# Patient Record
Sex: Female | Born: 1940 | Race: White | Hispanic: No | Marital: Married | Smoking: Never smoker
Health system: Southern US, Community
[De-identification: ages and names within clinical notes are randomized; demographics above are authoritative.]

## PROBLEM LIST (undated history)

## (undated) DIAGNOSIS — C50211 Malignant neoplasm of upper-inner quadrant of right female breast: Principal | ICD-10-CM

## (undated) DIAGNOSIS — E70319 Ocular albinism, unspecified: Secondary | ICD-10-CM

## (undated) DIAGNOSIS — M797 Fibromyalgia: Secondary | ICD-10-CM

## (undated) DIAGNOSIS — E559 Vitamin D deficiency, unspecified: Secondary | ICD-10-CM

## (undated) DIAGNOSIS — B009 Herpesviral infection, unspecified: Secondary | ICD-10-CM

## (undated) DIAGNOSIS — K2270 Barrett's esophagus without dysplasia: Secondary | ICD-10-CM

## (undated) DIAGNOSIS — F419 Anxiety disorder, unspecified: Secondary | ICD-10-CM

## (undated) DIAGNOSIS — E785 Hyperlipidemia, unspecified: Secondary | ICD-10-CM

## (undated) DIAGNOSIS — M8580 Other specified disorders of bone density and structure, unspecified site: Secondary | ICD-10-CM

## (undated) DIAGNOSIS — K219 Gastro-esophageal reflux disease without esophagitis: Secondary | ICD-10-CM

## (undated) DIAGNOSIS — IMO0002 Reserved for concepts with insufficient information to code with codable children: Secondary | ICD-10-CM

## (undated) DIAGNOSIS — IMO0001 Reserved for inherently not codable concepts without codable children: Secondary | ICD-10-CM

## (undated) DIAGNOSIS — M5136 Other intervertebral disc degeneration, lumbar region: Secondary | ICD-10-CM

## (undated) DIAGNOSIS — F32A Depression, unspecified: Secondary | ICD-10-CM

## (undated) DIAGNOSIS — F329 Major depressive disorder, single episode, unspecified: Secondary | ICD-10-CM

## (undated) DIAGNOSIS — M51369 Other intervertebral disc degeneration, lumbar region without mention of lumbar back pain or lower extremity pain: Secondary | ICD-10-CM

## (undated) DIAGNOSIS — A3790 Whooping cough, unspecified species without pneumonia: Secondary | ICD-10-CM

## (undated) DIAGNOSIS — D649 Anemia, unspecified: Secondary | ICD-10-CM

## (undated) HISTORY — DX: Fibromyalgia: M79.7

## (undated) HISTORY — DX: Depression, unspecified: F32.A

## (undated) HISTORY — DX: Hyperlipidemia, unspecified: E78.5

## (undated) HISTORY — PX: APPENDECTOMY: SHX54

## (undated) HISTORY — DX: Whooping cough, unspecified species without pneumonia: A37.90

## (undated) HISTORY — PX: EYE SURGERY: SHX253

## (undated) HISTORY — DX: Malignant neoplasm of upper-inner quadrant of right female breast: C50.211

## (undated) HISTORY — DX: Ocular albinism, unspecified: E70.319

## (undated) HISTORY — DX: Anxiety disorder, unspecified: F41.9

## (undated) HISTORY — PX: BILATERAL SALPINGOOPHORECTOMY: SHX1223

## (undated) HISTORY — DX: Gastro-esophageal reflux disease without esophagitis: K21.9

## (undated) HISTORY — DX: Anemia, unspecified: D64.9

## (undated) HISTORY — DX: Other specified disorders of bone density and structure, unspecified site: M85.80

## (undated) HISTORY — PX: COLONOSCOPY: SHX174

## (undated) HISTORY — DX: Other intervertebral disc degeneration, lumbar region: M51.36

## (undated) HISTORY — DX: Major depressive disorder, single episode, unspecified: F32.9

## (undated) HISTORY — DX: Other intervertebral disc degeneration, lumbar region without mention of lumbar back pain or lower extremity pain: M51.369

## (undated) HISTORY — DX: Vitamin D deficiency, unspecified: E55.9

## (undated) HISTORY — DX: Herpesviral infection, unspecified: B00.9

## (undated) HISTORY — PX: TOTAL ABDOMINAL HYSTERECTOMY: SHX209

## (undated) HISTORY — DX: Barrett's esophagus without dysplasia: K22.70

---

## 2000-03-17 ENCOUNTER — Ambulatory Visit (HOSPITAL_COMMUNITY): Admission: RE | Admit: 2000-03-17 | Discharge: 2000-03-17 | Payer: Self-pay | Admitting: Specialist

## 2000-03-31 ENCOUNTER — Encounter: Admission: RE | Admit: 2000-03-31 | Discharge: 2000-03-31 | Payer: Self-pay | Admitting: Cardiology

## 2000-03-31 ENCOUNTER — Encounter: Payer: Self-pay | Admitting: Cardiology

## 2001-04-20 ENCOUNTER — Encounter: Payer: Self-pay | Admitting: Cardiology

## 2001-04-20 ENCOUNTER — Encounter: Admission: RE | Admit: 2001-04-20 | Discharge: 2001-04-20 | Payer: Self-pay | Admitting: Cardiology

## 2002-05-07 ENCOUNTER — Encounter: Admission: RE | Admit: 2002-05-07 | Discharge: 2002-05-07 | Payer: Self-pay | Admitting: Internal Medicine

## 2002-05-07 ENCOUNTER — Encounter: Payer: Self-pay | Admitting: Internal Medicine

## 2003-07-25 ENCOUNTER — Encounter: Admission: RE | Admit: 2003-07-25 | Discharge: 2003-07-25 | Payer: Self-pay | Admitting: Internal Medicine

## 2003-07-25 ENCOUNTER — Encounter: Payer: Self-pay | Admitting: Internal Medicine

## 2004-08-12 ENCOUNTER — Encounter: Admission: RE | Admit: 2004-08-12 | Discharge: 2004-08-12 | Payer: Self-pay | Admitting: Internal Medicine

## 2005-10-26 ENCOUNTER — Encounter: Admission: RE | Admit: 2005-10-26 | Discharge: 2005-10-26 | Payer: Self-pay | Admitting: Internal Medicine

## 2006-04-26 ENCOUNTER — Encounter: Admission: RE | Admit: 2006-04-26 | Discharge: 2006-04-26 | Payer: Self-pay | Admitting: Internal Medicine

## 2006-06-08 ENCOUNTER — Ambulatory Visit: Payer: Self-pay | Admitting: Internal Medicine

## 2006-06-23 ENCOUNTER — Ambulatory Visit: Payer: Self-pay | Admitting: Internal Medicine

## 2006-06-23 ENCOUNTER — Encounter: Payer: Self-pay | Admitting: Internal Medicine

## 2006-11-09 ENCOUNTER — Encounter: Admission: RE | Admit: 2006-11-09 | Discharge: 2006-11-09 | Payer: Self-pay | Admitting: Internal Medicine

## 2008-02-01 ENCOUNTER — Encounter: Admission: RE | Admit: 2008-02-01 | Discharge: 2008-02-01 | Payer: Self-pay | Admitting: Internal Medicine

## 2009-02-04 ENCOUNTER — Encounter: Admission: RE | Admit: 2009-02-04 | Discharge: 2009-02-04 | Payer: Self-pay | Admitting: Internal Medicine

## 2010-02-04 ENCOUNTER — Encounter: Admission: RE | Admit: 2010-02-04 | Discharge: 2010-02-04 | Payer: Self-pay | Admitting: Internal Medicine

## 2010-11-07 ENCOUNTER — Encounter: Payer: Self-pay | Admitting: Internal Medicine

## 2011-01-05 ENCOUNTER — Other Ambulatory Visit: Payer: Self-pay | Admitting: Internal Medicine

## 2011-01-05 DIAGNOSIS — Z1231 Encounter for screening mammogram for malignant neoplasm of breast: Secondary | ICD-10-CM

## 2011-02-07 ENCOUNTER — Ambulatory Visit
Admission: RE | Admit: 2011-02-07 | Discharge: 2011-02-07 | Disposition: A | Discharge status: Home / Self Care | Payer: BC Managed Care – PPO | Source: Ambulatory Visit | Attending: Internal Medicine | Admitting: Internal Medicine

## 2011-02-07 DIAGNOSIS — Z1231 Encounter for screening mammogram for malignant neoplasm of breast: Secondary | ICD-10-CM

## 2011-03-04 NOTE — Assessment & Plan Note (Signed)
[facility]                           GASTROENTEROLOGY OFFICE NOTE   NAME:Podoll, ZAHIRAH CHESLOCK                   MRN:          [id number]  DATE:06/08/2006                            DOB:          1941-05-23    REFERRING PHYSICIAN:  Loraine Leriche A. Perini, MD   REASON FOR CONSULTATION:  Anemia.   HISTORY:  This is a pleasant, 70 year old, white female with no significant  past medical history, who is referred through the courtesy of Dr. Waynard Edwards  regarding anemia.  Over the past half-a-year, the patient has noticed  that  she has been a bit more tired than usual.  She held off evaluation until her  recent annual checkup.  At that time, blood work revealed anemia with a  hemoglobin of about 10.  This was down from a hemoglobin of 12.6 one year  prior.  Her MCV was normal at 92.4.  She tells me that she had iron studies  done as well as B12 and folate.  Those results are pending.   The patient's GI review of systems is remarkable for indigestion and  heartburn, particularly with dietary indiscretion, for which she takes  Protonix daily.  No dysphagia.  She denies abdominal pain, constipation,  diarrhea, melena, or hematochezia.  She has noticed some bloating recently  and a 5-pound weight gain.  She was to have submitted stool studies for  occult blood, but she has failed to do so.  She also mentions to me that she  has had microscopic hematuria for which she is currently being evaluated by  Dr. Retta Diones.  She has used nonsteroidal anti-inflammatory drugs in the  past for arthritic pain, but she reports not using such agents within the  past year.  She also reports having had a colonoscopy about 10 years ago  which, by her account, was unremarkable.  She does mention to me a brother  being diagnosed with gastric cancer (type unknown) about 1 year ago.  Unfortunately, he is dying.   PAST MEDICAL HISTORY:  Osteoarthritis.   PAST SURGICAL HISTORY:  1.  Hysterectomy.  2. Appendectomy.   ALLERGIES:  The patient has no known drug allergies.   CURRENT MEDICATIONS:  The patient's current medications include Protonix 40  mg daily, Glucosamine daily, Cymbalta 30 mg daily, and Ambien p.r.n.   FAMILY HISTORY:  The patient has a brother with unspecified gastric cancer.  No colon cancer or occult polyps.   SOCIAL HISTORY:  The patient is married with 1 son.  She lives with her  husband.  She works as a Interior and spatial designer for [employer].  She does not  smoke and rarely uses alcohol.   REVIEW OF SYSTEMS:  Per diagnostic evaluation form.   PHYSICAL EXAMINATION:  GENERAL:  This is a well-appearing female in no acute distress.   VITAL SIGNS:  BP 110/70, heart rate 68, weight 164.6 pounds, height 5 feet 3  inches.   HEENT:  Sclerae are anicteric.  Conjunctive are pink.  Oral mucosa is  intact.  No adenopathy.   LUNGS:  Clear.   HEART:  Regular.   ABDOMEN:  Soft without tenderness, mass, or hernia.   RECTAL:  Deferred.   EXTREMITIES:  No edema.   IMPRESSION:  1. Normocytic anemia of uncertain cause.  No evidence of clinically      obvious gastrointestinal blood loss.  B12, folate, and iron studies are      pending.  2. Gastroesophageal reflux disease.  No alarm symptoms.  Protonix required      to control symptoms.  3. Family history of gastric cancer in the patient's brother.  Type      unspecified.  4. Microscopic hematuria.  Currently undergoing evaluation.   RECOMMENDATIONS:  1. Obtain a colonoscopy and upper endoscopy to evaluate anemia.  Also      provide colorectal neoplasia screening.  In addition, screen for      Barrette esophagus and exclude risk factors for gastric cancer      (Helicobacter pylori).  2. Continue Protonix.  3. Follow up anemia studies.   Thank you for this consultation.                                   Wilhemina Bonito. Eda Keys., MD   JNP/MedQ  DD:  06/08/2006  DT:  06/08/2006  Job #:  [id number]    cc:   Loraine Leriche A. Waynard Edwards, MD  Bertram Millard. Retta Diones, MD

## 2012-02-06 ENCOUNTER — Other Ambulatory Visit: Payer: Self-pay | Admitting: Internal Medicine

## 2012-02-06 DIAGNOSIS — Z1231 Encounter for screening mammogram for malignant neoplasm of breast: Secondary | ICD-10-CM

## 2012-04-02 ENCOUNTER — Ambulatory Visit
Admission: RE | Admit: 2012-04-02 | Discharge: 2012-04-02 | Disposition: A | Discharge status: Home / Self Care | Payer: Medicare Other | Source: Ambulatory Visit | Attending: Internal Medicine | Admitting: Internal Medicine

## 2012-04-02 DIAGNOSIS — Z1231 Encounter for screening mammogram for malignant neoplasm of breast: Secondary | ICD-10-CM

## 2012-04-03 ENCOUNTER — Ambulatory Visit: Payer: BC Managed Care – PPO

## 2013-03-21 ENCOUNTER — Encounter: Payer: Self-pay | Admitting: Internal Medicine

## 2013-04-15 ENCOUNTER — Telehealth: Payer: Self-pay | Admitting: Internal Medicine

## 2013-04-15 ENCOUNTER — Ambulatory Visit: Payer: Medicare Other | Admitting: Internal Medicine

## 2013-04-22 ENCOUNTER — Other Ambulatory Visit: Payer: Self-pay

## 2013-04-22 ENCOUNTER — Encounter: Payer: Self-pay | Admitting: Internal Medicine

## 2013-04-22 DIAGNOSIS — Z1231 Encounter for screening mammogram for malignant neoplasm of breast: Secondary | ICD-10-CM

## 2013-06-03 ENCOUNTER — Ambulatory Visit
Admission: RE | Admit: 2013-06-03 | Discharge: 2013-06-03 | Disposition: A | Discharge status: Home / Self Care | Payer: Medicare Other | Source: Ambulatory Visit

## 2013-06-03 ENCOUNTER — Ambulatory Visit (INDEPENDENT_AMBULATORY_CARE_PROVIDER_SITE_OTHER): Payer: Medicare Other | Admitting: Internal Medicine

## 2013-06-03 ENCOUNTER — Encounter: Payer: Self-pay | Admitting: Internal Medicine

## 2013-06-03 VITALS — BP 130/72 | HR 70 | Ht 61.5 in | Wt 182.2 lb

## 2013-06-03 DIAGNOSIS — K219 Gastro-esophageal reflux disease without esophagitis: Secondary | ICD-10-CM

## 2013-06-03 DIAGNOSIS — Z1231 Encounter for screening mammogram for malignant neoplasm of breast: Secondary | ICD-10-CM

## 2013-06-03 DIAGNOSIS — K625 Hemorrhage of anus and rectum: Secondary | ICD-10-CM

## 2013-06-03 DIAGNOSIS — Z800 Family history of malignant neoplasm of digestive organs: Secondary | ICD-10-CM

## 2013-06-03 MED ORDER — MOVIPREP 100 G PO SOLR: 1.0000 | Freq: Once | ORAL | Status: DC

## 2013-06-03 NOTE — Patient Instructions (Addendum)

## 2013-06-03 NOTE — Progress Notes (Signed)
HISTORY OF PRESENT ILLNESS:  Kelli Owens is a 72 y.o. female with the below listed medical history who presents today regarding rectal bleeding, family history of colon cancer, and the need for colonoscopy. The patient was evaluated in the office 06/08/2006 regarding anemia. See that dictation for details. In September 2007 she underwent colonoscopy and upper endoscopy. Complete colonoscopy, with intubation of the terminal ileum, was normal. Routine followup in 10 years recommended. Upper endoscopy was normal except for questionable short segment Barrett's esophagus which was not confirmed with biopsy. Patient has not been seen since. She did have isolated episode of painless rectal bleeding in March. No recurrence. She denies abdominal pain, change in bowel habits, or weight loss. Of importance, her sister was diagnosed with colon cancer recently (age 59). Other GI complaints are that of acid reflux. Symptoms mostly controlled with daily Protonix. Occasional breakthrough with dietary indiscretion. No dysphagia. GI review of systems otherwise negative. Review of outside laboratories from 04/10/2013 reveal normal CBC with hemoglobin 12.6. Normal comprehensive metabolic panel   REVIEW OF SYSTEMS:  All non-GI ROS negative except for back pain, arthritis, fatigue  Past Medical History  Diagnosis Date  . Depression   . Anxiety   . GERD (gastroesophageal reflux disease)   . Anemia   . Dyslipidemia   . DDD (degenerative disc disease), lumbar   . Osteopenia   . Fibromyalgia   . OA (ocular albinism)   . HSV infection   . Vitamin D deficiency   . Whooping cough   . Barrett's esophagus     Past Surgical History  Procedure Laterality Date  . Appendectomy    . Total abdominal hysterectomy    . Bilateral salpingoophorectomy      Social History Kelli Owens  reports that she has never smoked. She has never used smokeless tobacco. She reports that  drinks alcohol. She reports that she  does not use illicit drugs.  family history includes Cancer in an other family member; Colon cancer (age of onset: 21) in her sister; Dementia in her brother; Diabetes in her father; Gastric cancer in her brother; Kidney disease in her mother; Stroke in her mother.  No Known Allergies     PHYSICAL EXAMINATION: Vital signs: BP 130/72  Pulse 70  Ht 5' 1.5" (1.562 m)  Wt 182 lb 3.2 oz (82.645 kg)  BMI 33.87 kg/m2  Constitutional: generally well-appearing, no acute distress Psychiatric: alert and oriented x3, cooperative Eyes: extraocular movements intact, anicteric, conjunctiva pink Mouth: oral pharynx moist, no lesions Neck: supple no lymphadenopathy Cardiovascular: heart regular rate and rhythm, no murmur Lungs: clear to auscultation bilaterally Abdomen: soft, nontender, nondistended, no obvious ascites, no peritoneal signs, normal bowel sounds, no organomegaly Rectal: Deferred until colonoscopy Extremities: no lower extremity edema bilaterally Skin: no lesions on visible extremities Neuro: No focal deficits. No asterixis.    ASSESSMENT:  #1. Minor rectal bleeding #2. Interval family history of colon cancer #3. Chronic GERD on PPI. Prior EGD as described #4. Negative index colonoscopy 2007   PLAN:  #1. Colonoscopy to provide colorectal neoplasia screening and to evaluate bleeding.The nature of the procedure, as well as the risks, benefits, and alternatives were carefully and thoroughly reviewed with the patient. Ample time for discussion and questions allowed. The patient understood, was satisfied, and agreed to proceed.  #2. Movi prep prescribed. Patient instructed on its use #3. Reflux precautions #4. Continue PPI to control GERD symptoms

## 2013-06-07 ENCOUNTER — Other Ambulatory Visit: Payer: Self-pay | Admitting: Internal Medicine

## 2013-06-07 DIAGNOSIS — R928 Other abnormal and inconclusive findings on diagnostic imaging of breast: Secondary | ICD-10-CM

## 2013-06-14 ENCOUNTER — Ambulatory Visit (AMBULATORY_SURGERY_CENTER): Payer: Medicare Other | Admitting: Internal Medicine

## 2013-06-14 ENCOUNTER — Encounter: Payer: Self-pay | Admitting: Internal Medicine

## 2013-06-14 VITALS — BP 116/64 | HR 59 | Temp 97.6°F | Resp 17 | Ht 61.5 in | Wt 182.0 lb

## 2013-06-14 DIAGNOSIS — K625 Hemorrhage of anus and rectum: Secondary | ICD-10-CM

## 2013-06-14 DIAGNOSIS — Z800 Family history of malignant neoplasm of digestive organs: Secondary | ICD-10-CM

## 2013-06-14 DIAGNOSIS — D126 Benign neoplasm of colon, unspecified: Secondary | ICD-10-CM

## 2013-06-14 DIAGNOSIS — Z1211 Encounter for screening for malignant neoplasm of colon: Secondary | ICD-10-CM

## 2013-06-14 MED ORDER — SODIUM CHLORIDE 0.9 % IV SOLN: 500.0000 mL | INTRAVENOUS | Status: DC

## 2013-06-14 NOTE — Progress Notes (Signed)
Called to room to assist during endoscopic procedure.  Patient ID and intended procedure confirmed with present staff. Received instructions for my participation in the procedure from the performing physician.  

## 2013-06-14 NOTE — Progress Notes (Signed)
Procedure ends, to recovery, report given and VSS. 

## 2013-06-14 NOTE — Op Note (Signed)
[facility] [address] [place], [address]   COLONOSCOPY PROCEDURE REPORT  PATIENT: Kelli Owens, Kelli Owens  MR#: [id number] BIRTHDATE: 1940-12-05 , 72  yrs. old GENDER: Female ENDOSCOPIST: Roxy Cedar, MD REFERRED NW:GNFA Perini, M.D. PROCEDURE DATE:  06/14/2013 PROCEDURE:   Colonoscopy with snare polypectomy x 1 First Screening Colonoscopy - Avg.  risk and is 50 yrs.  old or older - No.  Prior Negative Screening - Now for repeat screening. N/A  History of Adenoma - Now for follow-up colonoscopy & has been > or = to 3 yrs.  N/A  Polyps Removed Today? Yes. ASA CLASS:   Class II INDICATIONS:Patient's immediate family history of colon cancer (sister 41) and Rectal Bleeding.   Negative exam 2007 MEDICATIONS: MAC sedation, administered by CRNA and propofol (Diprivan) 250mg  IV  DESCRIPTION OF PROCEDURE:   After the risks benefits and alternatives of the procedure were thoroughly explained, informed consent was obtained.  A digital rectal exam revealed no abnormalities of the rectum.   The LB [id number]  endoscope was introduced through the anus and advanced to the cecum, which was identified by both the appendix and ileocecal valve. No adverse events experienced.   The quality of the prep was excellent, using MoviPrep  The instrument was then slowly withdrawn as the colon was fully examined.    COLON FINDINGS: A single polyp measuring 5 mm in size was found in the transverse colon.  A polypectomy was performed with a cold snare.  The resection was complete and the polyp tissue was completely retrieved.   Mild diverticulosis was noted in the sigmoid colon.   The colon mucosa was otherwise normal. Retroflexed views revealed no abnormalities. The time to cecum=2 minutes 47 seconds.  Withdrawal time=9 minutes 45 seconds.  The scope was withdrawn and the procedure completed. COMPLICATIONS: There were no complications.  ENDOSCOPIC IMPRESSION: 1.   Single  polyp measuring 5 mm in size was found in the transverse colon; polypectomy was performed with a cold snare 2.   Mild diverticulosis was noted in the sigmoid colon 3.   The colon mucosa was otherwise normal  RECOMMENDATIONS: 1. Return to the care of your primary provider.  GI follow up as needed   eSigned:  Roxy Cedar, MD 06/14/2013 10:19 AM   cc: Rodrigo Ran, MD and The Patient   PATIENT NAME:  Kelli Owens, Kelli Owens MR#: [id number]

## 2013-06-14 NOTE — Patient Instructions (Addendum)

## 2013-06-14 NOTE — Progress Notes (Signed)
Patient did not experience any of the following events: a burn prior to discharge; a fall within the facility; wrong site/side/patient/procedure/implant event; or a hospital transfer or hospital admission upon discharge from the facility. (G8907) Patient did not have preoperative order for IV antibiotic SSI prophylaxis. (G8918)  

## 2013-06-18 ENCOUNTER — Telehealth: Payer: Self-pay | Admitting: *Deleted

## 2013-06-18 NOTE — Telephone Encounter (Signed)
  Follow up Call-  Call back number 06/14/2013  Post procedure Call Back phone  # [phone]  Permission to leave phone message Yes     Patient questions:  Do you have a fever, pain , or abdominal swelling? no Pain Score  0 *  Have you tolerated food without any problems? yes  Have you been able to return to your normal activities? yes  Do you have any questions about your discharge instructions: Diet   no Medications  no Follow up visit  no  Do you have questions or concerns about your Care? no  Actions: * If pain score is 4 or above: No action needed, pain <4.

## 2013-06-21 ENCOUNTER — Encounter: Payer: Self-pay | Admitting: Internal Medicine

## 2013-07-04 ENCOUNTER — Ambulatory Visit
Admission: RE | Admit: 2013-07-04 | Discharge: 2013-07-04 | Disposition: A | Discharge status: Home / Self Care | Payer: Medicare Other | Source: Ambulatory Visit | Attending: Internal Medicine | Admitting: Internal Medicine

## 2013-07-04 DIAGNOSIS — R928 Other abnormal and inconclusive findings on diagnostic imaging of breast: Secondary | ICD-10-CM

## 2014-07-09 ENCOUNTER — Other Ambulatory Visit: Payer: Self-pay

## 2014-07-09 DIAGNOSIS — Z1231 Encounter for screening mammogram for malignant neoplasm of breast: Secondary | ICD-10-CM

## 2014-07-16 ENCOUNTER — Ambulatory Visit
Admission: RE | Admit: 2014-07-16 | Discharge: 2014-07-16 | Disposition: A | Discharge status: Home / Self Care | Payer: Medicare Other | Source: Ambulatory Visit

## 2014-07-16 DIAGNOSIS — Z1231 Encounter for screening mammogram for malignant neoplasm of breast: Secondary | ICD-10-CM

## 2014-07-17 ENCOUNTER — Other Ambulatory Visit: Payer: Self-pay | Admitting: Internal Medicine

## 2014-07-17 DIAGNOSIS — R928 Other abnormal and inconclusive findings on diagnostic imaging of breast: Secondary | ICD-10-CM

## 2014-07-28 ENCOUNTER — Other Ambulatory Visit: Payer: Self-pay | Admitting: Internal Medicine

## 2014-07-28 DIAGNOSIS — R928 Other abnormal and inconclusive findings on diagnostic imaging of breast: Secondary | ICD-10-CM

## 2014-08-06 ENCOUNTER — Other Ambulatory Visit: Payer: Self-pay | Admitting: Internal Medicine

## 2014-08-06 ENCOUNTER — Ambulatory Visit
Admission: RE | Admit: 2014-08-06 | Discharge: 2014-08-06 | Disposition: A | Discharge status: Home / Self Care | Payer: Medicare Other | Source: Ambulatory Visit | Attending: Internal Medicine | Admitting: Internal Medicine

## 2014-08-06 DIAGNOSIS — R928 Other abnormal and inconclusive findings on diagnostic imaging of breast: Secondary | ICD-10-CM

## 2014-08-08 ENCOUNTER — Encounter: Payer: Self-pay | Admitting: *Deleted

## 2014-08-08 ENCOUNTER — Telehealth: Payer: Self-pay | Admitting: *Deleted

## 2014-08-08 DIAGNOSIS — C50211 Malignant neoplasm of upper-inner quadrant of right female breast: Secondary | ICD-10-CM | POA: Insufficient documentation

## 2014-08-08 HISTORY — DX: Malignant neoplasm of upper-inner quadrant of right female breast: C50.211

## 2014-08-08 NOTE — Telephone Encounter (Signed)
Confirmed [facility]  appt and gave instructions. Pt denies further needs at this time. Contact information given.

## 2014-08-08 NOTE — Telephone Encounter (Signed)
Left vm for pt to return call concerning appt for [facility] on 08/13/14. Leigh from [facility] informed pt request AM clinic and gave arrival time of 0800.

## 2014-08-13 ENCOUNTER — Encounter: Payer: Self-pay | Admitting: Hematology and Oncology

## 2014-08-13 ENCOUNTER — Ambulatory Visit (INDEPENDENT_AMBULATORY_CARE_PROVIDER_SITE_OTHER): Payer: Self-pay | Admitting: Surgery

## 2014-08-13 ENCOUNTER — Ambulatory Visit: Payer: Medicare Other

## 2014-08-13 ENCOUNTER — Encounter: Payer: Self-pay | Admitting: *Deleted

## 2014-08-13 ENCOUNTER — Other Ambulatory Visit: Payer: Self-pay | Admitting: Hematology and Oncology

## 2014-08-13 ENCOUNTER — Ambulatory Visit
Admission: RE | Admit: 2014-08-13 | Discharge: 2014-08-13 | Disposition: A | Discharge status: Home / Self Care | Payer: Medicare Other | Source: Ambulatory Visit | Attending: Radiation Oncology | Admitting: Radiation Oncology

## 2014-08-13 ENCOUNTER — Telehealth: Payer: Self-pay | Admitting: Hematology and Oncology

## 2014-08-13 ENCOUNTER — Ambulatory Visit: Payer: Medicare Other | Attending: Surgery | Admitting: Physical Therapy

## 2014-08-13 ENCOUNTER — Encounter (INDEPENDENT_AMBULATORY_CARE_PROVIDER_SITE_OTHER): Payer: Self-pay

## 2014-08-13 ENCOUNTER — Other Ambulatory Visit (HOSPITAL_BASED_OUTPATIENT_CLINIC_OR_DEPARTMENT_OTHER): Payer: Medicare Other

## 2014-08-13 ENCOUNTER — Other Ambulatory Visit: Payer: Self-pay | Admitting: *Deleted

## 2014-08-13 ENCOUNTER — Encounter: Payer: Self-pay | Admitting: Radiation Oncology

## 2014-08-13 ENCOUNTER — Encounter: Payer: Self-pay | Admitting: Dietician

## 2014-08-13 ENCOUNTER — Ambulatory Visit (HOSPITAL_BASED_OUTPATIENT_CLINIC_OR_DEPARTMENT_OTHER): Payer: Medicare Other | Admitting: Hematology and Oncology

## 2014-08-13 VITALS — BP 135/61 | HR 59 | Temp 97.6°F | Resp 18 | Ht 61.5 in | Wt 171.3 lb

## 2014-08-13 DIAGNOSIS — C50211 Malignant neoplasm of upper-inner quadrant of right female breast: Secondary | ICD-10-CM

## 2014-08-13 DIAGNOSIS — Z171 Estrogen receptor negative status [ER-]: Secondary | ICD-10-CM

## 2014-08-13 DIAGNOSIS — R293 Abnormal posture: Secondary | ICD-10-CM | POA: Insufficient documentation

## 2014-08-13 DIAGNOSIS — C50919 Malignant neoplasm of unspecified site of unspecified female breast: Secondary | ICD-10-CM

## 2014-08-13 DIAGNOSIS — C50911 Malignant neoplasm of unspecified site of right female breast: Secondary | ICD-10-CM

## 2014-08-13 LAB — CBC WITH DIFFERENTIAL/PLATELET
BASO%: 0.3 % (ref 0.0–2.0)
Basophils Absolute: 0 10*3/uL (ref 0.0–0.1)
EOS%: 0.6 % (ref 0.0–7.0)
Eosinophils Absolute: 0 10*3/uL (ref 0.0–0.5)
HEMATOCRIT: 36.2 % (ref 34.8–46.6)
HGB: 12 g/dL (ref 11.6–15.9)
LYMPH#: 2 10*3/uL (ref 0.9–3.3)
LYMPH%: 37.9 % (ref 14.0–49.7)
MCH: 31.3 pg (ref 25.1–34.0)
MCHC: 33.1 g/dL (ref 31.5–36.0)
MCV: 94.4 fL (ref 79.5–101.0)
MONO#: 0.4 10*3/uL (ref 0.1–0.9)
MONO%: 8.5 % (ref 0.0–14.0)
NEUT#: 2.8 10*3/uL (ref 1.5–6.5)
NEUT%: 52.7 % (ref 38.4–76.8)
PLATELETS: 250 10*3/uL (ref 145–400)
RBC: 3.83 10*6/uL (ref 3.70–5.45)
RDW: 12.5 % (ref 11.2–14.5)
WBC: 5.3 10*3/uL (ref 3.9–10.3)

## 2014-08-13 LAB — COMPREHENSIVE METABOLIC PANEL (CC13)
ALT: 20 U/L (ref 0–55)
AST: 15 U/L (ref 5–34)
Albumin: 3.6 g/dL (ref 3.5–5.0)
Alkaline Phosphatase: 40 U/L (ref 40–150)
Anion Gap: 7 mEq/L (ref 3–11)
BILIRUBIN TOTAL: 0.37 mg/dL (ref 0.20–1.20)
BUN: 16.5 mg/dL (ref 7.0–26.0)
CO2: 23 mEq/L (ref 22–29)
CREATININE: 0.9 mg/dL (ref 0.6–1.1)
Calcium: 9.7 mg/dL (ref 8.4–10.4)
Chloride: 112 mEq/L — ABNORMAL HIGH (ref 98–109)
Glucose: 90 mg/dl (ref 70–140)
Potassium: 3.9 mEq/L (ref 3.5–5.1)
SODIUM: 143 meq/L (ref 136–145)
TOTAL PROTEIN: 6.3 g/dL — AB (ref 6.4–8.3)

## 2014-08-13 NOTE — Progress Notes (Signed)
Patient was seen by RD during [facility] on 08/13/2014  Provided pt with folder of educational materials regarding general nutrition recommendations for breast cancer patients, plant-based diets, antioxidants, cancer facts vs myths, and information on organic foods  Explained importance of healthy nutrition during treatments and encouraged pt to consume daily recommended amount of fruits and vegetables, emphasizing variety of intake for maximum antioxidant and synergistic health benefits. Promoted adequate fiber intake, with use of whole grain and whole wheat products, beans, and lentils. Encouraged patient to follow a low fat diet with use of heart healthy fats, and to opt for plant-based proteins weekly  Recommended pt maintain healthy weight during treatments, and encouraged gradual weight loss as warranted after procedures.  Diet recall indicated regular meals with increased vegetables.  Patient had questions regarding healthy eating, supplements.  Expect good compliance  Provided pt with outpatient oncology RD contact information. Encouraged pt to contact RD with additional follow up questions or nutrition-related concerns.  Antonieta Iba, RD, LDN Clinical Inpatient Dietitian Pager:  [phone] Weekend and after hours pager:  [phone]

## 2014-08-13 NOTE — Progress Notes (Signed)
Note created by Dr. Lindi Adie during office visit. Copy to patient original to scan.

## 2014-08-13 NOTE — Assessment & Plan Note (Addendum)
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 After reviewing the pathology in detail, we proceeded to discuss the different treatment options between surgery, radiation, chemotherapy, and the lack of benefit from antiestrogen therapies because she is ER/PR negative.  Recommendation: Based on multidisciplinary tumor board discussion, our recommendation would be to do neoadjuvant chemotherapy. But the patient is reluctant to undergo chemotherapy up front and might choose to undergo surgery up front. If she chooses neoadjuvant chemotherapy, We will obtain MRI of the breast for pre-chemotherapy evaluation. Neoadjuvant chemotherapy regimen: Taxotere, carboplatin, Herceptin, Perjeta every 3 weeks x6 cycles of Herceptin maintenance for one year. After surgery, she could need radiation and Herceptin maintenance therapy. There is no role of antiestrogen therapy. If she decides to undergo surgery up front, we will reassess her after surgery to determine the best treatment option.  Chemotherapy counseling:I discussed the risks and benefits of chemotherapy including the risks of nausea/ vomiting, risk of infection from low WBC count, fatigue due to chemo or anemia, bruising or bleeding due to low platelets, mouth sores, loss/ change in taste and decreased appetite. Liver and kidney function will be monitored through out chemotherapy as abnormalities in liver and kidney function may be a side effect of treatment. Cardiac dysfunction due to Herceptin  and Perjeta were discussed in detail.  Plan: Await final decision regarding surgery versus chemotherapy  upfront. If she decides to do neoadjuvant chemotherapy, we will obtain echocardiogram, chemotherapy, port placement and MRI of the breasts prior to starting treatment .

## 2014-08-13 NOTE — Telephone Encounter (Signed)
, °

## 2014-08-13 NOTE — Progress Notes (Signed)
[facility] CONSULT NOTE  Patient Care Team: Jerlyn Ly, MD as PCP - General (Internal Medicine) Alphonsa Overall, MD as Consulting Physician (General Surgery) Rulon Eisenmenger, MD as Consulting Physician (Hematology and Oncology) Blair Promise, MD as Consulting Physician (Radiation Oncology)  CHIEF COMPLAINTS/PURPOSE OF CONSULTATION:  Newly diagnosed breast cancer  HISTORY OF PRESENTING ILLNESS:  Kelli Owens 73 y.o. female is here because of recent diagnosis of right breast cancer. She had a mammogram 07/17/2014 that revealed an abnormality in right breast at 3:00 position. This was followed by ultrasound which revealed 2.4 cm abnormality in the breast. 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 She was presented this morning at the multidisciplinary tumor board. She is here with a breast [facility] to discuss treatment plan. She has never felt any abnormalities in the breast to physical exam. She reports that her primary care physician also examined her and did not feel a lump.  I reviewed her records extensively and collaborated the history with the patient.  SUMMARY OF ONCOLOGIC HISTORY:   Breast cancer of upper-inner quadrant of right female breast   07/17/2014 Mammogram Right breast mass at 3:00 position by ultrasound measured 2.4 cm   08/06/2014 Initial Biopsy 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 ratio 2.75, Ki-67 80%    In terms of breast cancer risk profile:  She menarched at early age of 78 and went to menopause at age 75  She had one pregnancy, her first child was born at age 36  She has not received birth control pills.  She was never exposed to fertility medications or hormone replacement therapy.  She has  family history of Breast/GYN/GI cancer  MEDICAL HISTORY:  Past Medical History  Diagnosis Date  . Depression   . Anxiety   . GERD  (gastroesophageal reflux disease)   . Anemia   . Dyslipidemia   . DDD (degenerative disc disease), lumbar   . Osteopenia   . Fibromyalgia   . OA (ocular albinism)   . HSV infection   . Vitamin D deficiency   . Whooping cough   . Barrett's esophagus   . Breast cancer of upper-inner quadrant of right female breast 08/08/2014    SURGICAL HISTORY: Past Surgical History  Procedure Laterality Date  . Appendectomy    . Total abdominal hysterectomy    . Bilateral salpingoophorectomy      SOCIAL HISTORY: History   Social History  . Marital Status: Married    Spouse Name: N/A    Number of Children: 1  . Years of Education: N/A   Occupational History  . retired    Social History Main Topics  . Smoking status: Never Smoker   . Smokeless tobacco: Never Used  . Alcohol Use: 1.2 oz/week    2 Glasses of wine per week     Comment: rarely  . Drug Use: No  . Sexual Activity: Not on file   Other Topics Concern  . Not on file   Social History Narrative  . No narrative on file    FAMILY HISTORY: Family History  Problem Relation Age of Onset  . Diabetes Father   . Stroke Mother   . Gastric cancer Brother   . Dementia Brother   . Cancer      groin area  . Colon cancer Sister 68  . Kidney disease Mother     lost rt.  kidney due to stone    ALLERGIES:  has No Known Allergies.  MEDICATIONS:  Current Outpatient Prescriptions  Medication Sig Dispense Refill  . aspirin 81 MG tablet Take 81 mg by mouth daily.      . Calcium Carbonate-Vitamin D (CALCIUM PLUS VITAMIN D PO) Take 1 tablet by mouth daily.      . fexofenadine (ALLEGRA) 180 MG tablet Take 180 mg by mouth daily.      . furosemide (LASIX) 20 MG tablet Take 20 mg by mouth daily.      Marland Kitchen glucosamine-chondroitin 500-400 MG tablet Take 1 tablet by mouth daily.      Marland Kitchen oxyCODONE-acetaminophen (TYLOX) 5-500 MG per capsule Take 1 capsule by mouth every 4 (four) hours as needed for pain.      . pantoprazole (PROTONIX) 40 MG  tablet Take 40 mg by mouth daily.      . pravastatin (PRAVACHOL) 20 MG tablet       . pregabalin (LYRICA) 25 MG capsule Take 25 mg by mouth as needed.      . temazepam (RESTORIL) 30 MG capsule Take 30 mg by mouth at bedtime as needed for sleep.      . traMADol (ULTRAM) 50 MG tablet Take 50 mg by mouth every 6 (six) hours as needed for pain.      . Vitamin D, Ergocalciferol, (DRISDOL) 50000 UNITS CAPS Take 50,000 Units by mouth every 7 (seven) days.       No current facility-administered medications for this visit.    REVIEW OF SYSTEMS:   Constitutional: Denies fevers, chills or abnormal night sweats Eyes: Denies blurriness of vision, double vision or watery eyes Ears, nose, mouth, throat, and face: Denies mucositis or sore throat Respiratory: Denies cough, dyspnea or wheezes Cardiovascular: Denies palpitation, chest discomfort or lower extremity swelling Gastrointestinal:  Denies nausea, heartburn or change in bowel habits Skin: Denies abnormal skin rashes Lymphatics: Denies new lymphadenopathy or easy bruising Neurological:Denies numbness, tingling or new weaknesses Behavioral/Psych: Mood is stable, no new changes  Breast:  Denies any palpable lumps or discharge All other systems were reviewed with the patient and are negative.  PHYSICAL EXAMINATION: ECOG PERFORMANCE STATUS: 0 - Asymptomatic  Filed Vitals:   08/13/14 0852  BP: 135/61  Pulse: 59  Temp: 97.6 F (36.4 C)  Resp: 18   Filed Weights   08/13/14 0852  Weight: 171 lb 4.8 oz (77.701 kg)    GENERAL:alert, no distress and comfortable SKIN: skin color, texture, turgor are normal, no rashes or significant lesions EYES: normal, conjunctiva are pink and non-injected, sclera clear OROPHARYNX:no exudate, no erythema and lips, buccal mucosa, and tongue normal  NECK: supple, thyroid normal size, non-tender, without nodularity LYMPH:  no palpable lymphadenopathy in the cervical, axillary or inguinal LUNGS: clear to  auscultation and percussion with normal breathing effort HEART: regular rate & rhythm and no murmurs and no lower extremity edema ABDOMEN:abdomen soft, non-tender and normal bowel sounds Musculoskeletal:no cyanosis of digits and no clubbing  PSYCH: alert & oriented x 3 with fluent speech NEURO: no focal motor/sensory deficits BREAST bruising noted in the right breast where she had the biopsy is a small hematoma underneath it. Otherwise no palpable masses or lymphadenopathy  LABORATORY DATA:  I have reviewed the data as listed Lab Results  Component Value Date   WBC 5.3 08/13/2014   HGB 12.0 08/13/2014   HCT 36.2 08/13/2014   MCV 94.4 08/13/2014   PLT 250 08/13/2014   Lab Results  Component Value  Date   NA 143 08/13/2014   K 3.9 08/13/2014   CO2 23 08/13/2014    RADIOGRAPHIC STUDIES: I have personally reviewed the radiological reports and agreed with the findings in the report.  ASSESSMENT AND PLAN:  Breast cancer of upper-inner quadrant of right female breast Right breast invasive mammary cancer most likely ductal phenotype grade 3, 2.4 cm mass at 3:00 position ER negative PR negative HER-2 positive ratio 2.75, Ki-67 80% clinical stage IIA, T2, N0, M0  Pathology counseling:Discussed with the patient, the details of pathology including the type of breast cancer,the clinical staging, the significance of ER, PR and HER-2/neu receptors and the implications for treatment. After reviewing the pathology in detail, we proceeded to discuss the different treatment options between surgery, radiation, chemotherapy, and the lack of benefit from antiestrogen therapies because she is ER/PR negative.  Recommendation: Based on multidisciplinary tumor board discussion, our recommendation would be to do neoadjuvant chemotherapy. But the patient is reluctant to undergo chemotherapy up front and might choose to undergo surgery up front. If she chooses neoadjuvant chemotherapy, We will obtain MRI of the  breast for pre-chemotherapy evaluation. Neoadjuvant chemotherapy regimen: Taxotere, carboplatin, Herceptin, Perjeta every 3 weeks x6 cycles of Herceptin maintenance for one year. After surgery, she could need radiation and Herceptin maintenance therapy. There is no role of antiestrogen therapy. If she decides to undergo surgery up front, we will reassess her after surgery to determine the best treatment option.  Chemotherapy counseling:I discussed the risks and benefits of chemotherapy including the risks of nausea/ vomiting, risk of infection from low WBC count, fatigue due to chemo or anemia, bruising or bleeding due to low platelets, mouth sores, loss/ change in taste and decreased appetite. Liver and kidney function will be monitored through out chemotherapy as abnormalities in liver and kidney function may be a side effect of treatment. Cardiac dysfunction due to Herceptin  and Perjeta were discussed in detail.  Plan: Await final decision regarding surgery versus chemotherapy upfront. If she decides to do neoadjuvant chemotherapy, we will obtain echocardiogram, chemotherapy, port placement and MRI of the breasts prior to starting treatment .    All questions were answered. The patient knows to call the clinic with any problems, questions or concerns. I spent 40 minutes counseling the patient face to face. The total time spent in the appointment was 60 minutes and more than 50% was on counseling.     Rulon Eisenmenger, MD 08/13/2014 9:52 AM

## 2014-08-13 NOTE — Progress Notes (Signed)
Radiation Oncology         ([phone]) [phone] ________________________________  Initial outpatient Consultation  Name: Kelli Owens MRN: [id number]  Date: 08/13/2014  DOB: 1941/03/15  MW:[id number],WNUU A, MD  Alphonsa Overall, MD   REFERRING PHYSICIAN: Alphonsa Overall, MD  DIAGNOSIS: Grade 3 invasive ductal carcinoma presenting in the medial aspect of the right breast, clinical stage IIA   HISTORY OF PRESENT ILLNESS::Kelli Owens is a 73 y.o. female who is seen out courtesy of Dr. Alphonsa Overall for an opinion concerning radiation therapy as part of management of patient's recently diagnosed right breast cancer. Earlier this year on routine screening mammography the patient was found to have a possible lesion in the 3:00 position of the right breast. Additional imaging including a digital diagnostic mammogram and ultrasound was performed which confirmed by ultrasound to be a 2.4 cm lesion. A biopsy was performed of this area which revealed grade 3 invasive ductal carcinoma, ER negative, PR negative, HER-2/neu positive with a ratio of 2.75, proliferation marker was elevated at 80%. With this information the patient is now seen in the multi-disciplinary breast clinic.Marland Kitchen  PREVIOUS RADIATION THERAPY: No  PAST MEDICAL HISTORY:  has a past medical history of Depression; Anxiety; GERD (gastroesophageal reflux disease); Anemia; Dyslipidemia; DDD (degenerative disc disease), lumbar; Osteopenia; Fibromyalgia; OA (ocular albinism); HSV infection; Vitamin D deficiency; Whooping cough; Barrett's esophagus; and Breast cancer of upper-inner quadrant of right female breast (08/08/2014).    PAST SURGICAL HISTORY: Past Surgical History  Procedure Laterality Date  . Appendectomy    . Total abdominal hysterectomy    . Bilateral salpingoophorectomy      FAMILY HISTORY: family history includes Cancer in an other family member; Colon cancer (age of onset: 57) in her sister; Dementia in her brother; Diabetes in her  father; Gastric cancer in her brother; Kidney disease in her mother; Stroke in her mother.  SOCIAL HISTORY:  reports that she has never smoked. She has never used smokeless tobacco. She reports that she drinks about 1.2 ounces of alcohol per week. She reports that she does not use illicit drugs.  ALLERGIES: Review of patient's allergies indicates no known allergies.  MEDICATIONS:  Current Outpatient Prescriptions  Medication Sig Dispense Refill  . DULoxetine (CYMBALTA) 30 MG capsule Take 30 mg by mouth daily.      . furosemide (LASIX) 20 MG tablet Take 20 mg by mouth daily.      . pravastatin (PRAVACHOL) 20 MG tablet       . pregabalin (LYRICA) 25 MG capsule Take 25 mg by mouth as needed.      . temazepam (RESTORIL) 30 MG capsule Take 30 mg by mouth at bedtime as needed for sleep.      . Vitamin D, Ergocalciferol, (DRISDOL) 50000 UNITS CAPS Take 50,000 Units by mouth every 7 (seven) days.       No current facility-administered medications for this encounter.    REVIEW OF SYSTEMS:  A 15 point review of systems is documented in the electronic medical record. This was obtained by the nursing staff. However, I reviewed this with the patient to discuss relevant findings and make appropriate changes. Prior to diagnosis the patient denied any pain within the right breast area nipple discharge or bleeding. She denies any new bony pain headaches dizziness or blurred vision.   PHYSICAL EXAM:  Vitals - 1 value per visit 72/53/6644  SYSTOLIC 034  DIASTOLIC 61  Pulse 59  Temperature 97.6  Respirations 18  Weight (lb) 171.3  Height 5' 1.5"  BMI 31.85  VISIT REPORT    In Gen. this is a very pleasant 73 year old female in no acute distress. She is accompanied by her husband on evaluation today. The patient and her [place]. Examination of the neck and supraclavicular region reveals no evidence of adenopathy. The axillary areas are free of adenopathy. Examination  of the lungs reveals them to be clear. The heart has a regular rhythm and rate. Examination of the left breast reveals no palpable mass nipple discharge or bleeding. Examination of the right breast reveals some bruising in the medial aspect of the breast. There is some thickening in this area measuring approximately 3-4 cm in size,  palpation of the tumor is difficult in light of bruising. No other areas of concern in the right breast no nipple discharge or bleeding.   ECOG = 0  0 - Asymptomatic (Fully active, able to carry on all predisease activities without restriction)  LABORATORY DATA:  Lab Results  Component Value Date   WBC 5.3 08/13/2014   HGB 12.0 08/13/2014   HCT 36.2 08/13/2014   MCV 94.4 08/13/2014   PLT 250 08/13/2014   NEUTROABS 2.8 08/13/2014   Lab Results  Component Value Date   NA 143 08/13/2014   K 3.9 08/13/2014   CO2 23 08/13/2014   GLUCOSE 90 08/13/2014   CREATININE 0.9 08/13/2014   CALCIUM 9.7 08/13/2014      RADIOGRAPHY: Mm Digital Diagnostic Unilat R  08/06/2014   CLINICAL DATA:  Status post ultrasound-guided core biopsy of mass in the 3 o'clock location of the right breast.  EXAM: DIAGNOSTIC RIGHT MAMMOGRAM POST ULTRASOUND BIOPSY  COMPARISON:  Previous exams  FINDINGS: Mammographic images were obtained following ultrasound guided biopsy of mass in the 3 o'clock location of the right breast. A heart shaped clip is identified in the medial aspect of the right breast.  IMPRESSION: Tissue marker clip is in expected location following biopsy.  Final Assessment: Post Procedure Mammograms for Marker Placement   Electronically Signed   By: Shon Hale M.D.   On: 08/06/2014 13:59   Mm Digital Diagnostic Unilat R  08/06/2014   CLINICAL DATA:  The patient returns after screening study for evaluation of possible mass in the right breast.  EXAM: DIGITAL DIAGNOSTIC  RIGHT MAMMOGRAM  ULTRASOUND RIGHT BREAST  COMPARISON:  07/16/2014 and earlier  ACR Breast Density Category  c: The breast tissue is heterogeneously dense, which may obscure small masses.  FINDINGS: Additional views are performed confirming presence of a spiculated mass medial to the right nipple.  On physical exam, I palpate a focal mass medial to the right nipple.  Ultrasound is performed, showing an irregular hypoechoic mass in the 3 o'clock location of the right breast 2 cm from the nipple. Mass is composed acute numerous nodules, is spiculated, and vascular on Doppler evaluation. By ultrasound mass is 2.4 x 1.5 x 1.8 cm. Evaluation of the right axilla is negative for adenopathy.  IMPRESSION: Suspicious mass in the 3 o'clock location of the right breast.  RECOMMENDATION: Ultrasound-guided core biopsy is recommended. The patient request biopsy today. This is performed and dictated separately.  I have discussed the findings and recommendations with the patient. Results were also provided in writing at the conclusion of the visit. If applicable, a reminder letter will be sent to the patient regarding the next appointment.  BI-RADS CATEGORY  4: Suspicious.   Electronically Signed   By: Damien Fusi.D.  On: 08/06/2014 11:13   Mm Screening Breast Tomo Bilateral  07/17/2014   CLINICAL DATA:  Screening.  EXAM: DIGITAL SCREENING BILATERAL MAMMOGRAM WITH 3D TOMO WITH CAD  DIGITAL BREAST TOMOSYNTHESIS  Digital breast tomosynthesis images are acquired in two projections. These images are reviewed in combination with the digital mammogram, confirming the findings below.  COMPARISON:  Previous exam(s)  ACR Breast Density Category b: There are scattered areas of fibroglandular density.  FINDINGS: In the right breast, a possible mass warrants further evaluation with spot compression views and possibly ultrasound. In the left breast, no findings suspicious for malignancy. Images were processed with CAD.  IMPRESSION: Further evaluation is suggested for possible mass in the right breast.  RECOMMENDATION: Diagnostic mammogram and  possibly ultrasound of the right breast. (Code:[id number]-64M)  The patient will be contacted regarding the findings, and additional imaging will be scheduled.  BI-RADS CATEGORY  0: Incomplete. Need additional imaging evaluation and/or prior mammograms for comparison.   Electronically Signed   By: Anselmo Pickler M.D.   On: 07/17/2014 09:55   [place] Rt Breast Bx W Loc Dev 1st Lesion Img Bx Spec US Guide  08/07/2014   ADDENDUM REPORT: 08/07/2014 12:27  ADDENDUM: Pathology revealed grade III invasive ductal carcinoma in the right breast. This was found to be concordant by Dr. Rosalie Gums. Pathology was discussed with the patient by telephone. She reported doing well after the biopsy with minimal tenderness at the site. Post biopsy instructions were reviewed and her questions were answered. She has been scheduled at [facility] on August 13, 2014. The patient is encouraged to come to [facility] for educational materials. My number is provided for future questions and concerns.  Pathology results reported by Sonnie Alamo RN, BSN on August 07, 2014.   Electronically Signed   By: Rosalie Gums M.D.   On: 08/07/2014 12:27   08/07/2014   CLINICAL DATA:  Mass in the 3 o'clock location of the right breast.  EXAM: ULTRASOUND GUIDED RIGHT BREAST CORE NEEDLE BIOPSY  COMPARISON:  Previous exams.  PROCEDURE: I met with the patient and we discussed the procedure of ultrasound-guided biopsy, including benefits and alternatives. We discussed the high likelihood of a successful procedure. We discussed the risks of the procedure including infection, bleeding, tissue injury, clip migration, and inadequate sampling. Informed written consent was given. The usual time-out protocol was performed immediately prior to the procedure.  Using sterile technique and 2% Lidocaine as local anesthetic, under direct ultrasound visualization, a 12 gauge vacuum-assisteddevice was used to  perform biopsy of mass in the 3 o'clock location of the right breast using a medial approach. At the conclusion of the procedure, a Heart shaped tissue marker clip was deployed into the biopsy cavity. Follow-up 2-view mammogram was performed and dictated separately.  IMPRESSION: Ultrasound-guided biopsy of right breast mass. No apparent complications.  Electronically Signed: By: Rosalie Gums M.D. On: 08/06/2014 13:58      IMPRESSION: clinical stage IIA, T2, N0, M0 high-grade invasive ductal carcinoma of the right breast. Patient would be a candidate for breast conservation therapy with lumpectomy followed by radiation. Patient also understands that she would be a candidate for mastectomy but this seems to be a drastic approach for small tumor which can be easily approached with surgery. I discussed the treatment approach side effects and potential toxicities of radiation therapy with the patient and her husband. She appears to understand and does wish to consider breast conservation  therapy at this time. Given the high-grade nature of the lesion as well as the HER-2 new positive situation, chemotherapy is recommended for the patient and at the multidisciplinary tumor Board strong consideration for neoadjuvant chemotherapy.  At this time the patient  is undecided which treatment approach she will proceed with that being neoadjuvant or adjuvant chemotherapy.   I discussed potential radiation therapy at the [facility] or a [facility] long hospital which ever is most convenient for the patient.  It is also recommended patient proceed with an MRI to rule out a second lesion prior to breast conserving surgery especially since patient's breast density is significant.  PLAN: The patient will be seen in the postoperative setting for further discussion of radiation therapy as breast conservation treatment   ------------------------------------------------  Blair Promise, PhD, MD

## 2014-08-13 NOTE — Progress Notes (Signed)
Checked in new pt with no financial concerns at this time.  Informed her that I will contact her ins to see if Josem Kaufmann is req for chemo if needed as well as contact foundations that offer copay assistance for chemo if needed.  She has my card for any questions or concerns.

## 2014-08-13 NOTE — Progress Notes (Signed)
Faxed [facility] pa form to [employer] °

## 2014-08-13 NOTE — H&P (Signed)
Re:   Kelli Owens DOB:   [birth year] MRN:   [id number]  [facility]  ASSESSMENT AND PLAN: 1.  Right breast cancer, 3 o'clock  Path - 08/06/2014 ([id number]) - IDC, Her2Neu - positive  Treating oncologist - Gudena/Kinard   I discussed the options for breast cancer treatment with the patient.  She is in the multidisciplinary clinic and understands the treatment of breast cancer includes medical oncology and radiation oncology.  I discussed the surgical options of lumpectomy vs. mastectomy.  If mastectomy, there is the possibility of reconstruction.   I discussed the options of lymph node biopsy.  The treatment plan depends on the pathologic staging of the tumor and the patient's personal wishes.  The risks of surgery include, but are not limited to, bleeding, infection, the need for further surgery, and nerve injury.  The patient has been given literature on the treatment of breast cancer.  Plan:1)  MRI to better define cancer, 2) power port placement, 3) neoadjuvant chemotherapy, 4) Right lumpectomy and right axillary SLNBx  2.  Back pain - DJD of back   No chief complaint on file.  REFERRING PHYSICIAN: Jerlyn Ly, MD  HISTORY OF PRESENT ILLNESS: Kelli Owens is a 73 y.o. (DOB: 03/16/1941)  white  female whose primary care physician is Jerlyn Ly, MD and comes to [facility] today for right breast cancer.  She had her routine mammogram and there was a mass found in her right breast.  She had a hysterectomy at age 80.  She took hormones early after surgery, but not recently.  No family history of breast cancer.  She had a sister who had colon ca.  Her husband has an impant for parkinson's disease at [facility]. Dr. Clinton Sawyer (at [facility]) suggested she see someone there for the cancer. 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 For now she is here.  Right breast cancer, 3 o'clock Path - 08/06/2014 ([id number]) - IDC, Her2Neu - positive. ER/PR -  negative.  I discussed the options for breast cancer treatment with the patient. She is in the multidisciplinary clinic and understands the treatment of breast cancer includes medical oncology and radiation oncology. I discussed the surgical options of lumpectomy vs. mastectomy. If mastectomy, there is the possibility of reconstruction. I discussed the options of lymph node biopsy. The treatment plan depends on the pathologic staging of the tumor and the patient's personal wishes. The risks of surgery include, but are not limited to, bleeding, infection, the need for further surgery, and nerve injury. The patient has been given literature on the treatment of breast cancer.   Past Medical History  Diagnosis Date  . Depression   . Anxiety   . GERD (gastroesophageal reflux disease)   . Anemia   . Dyslipidemia   . DDD (degenerative disc disease), lumbar   . Osteopenia   . Fibromyalgia   . OA (ocular albinism)   . HSV infection   . Vitamin D deficiency   . Whooping cough   . Barrett's esophagus   . Breast cancer of upper-inner quadrant of right female breast 08/08/2014      Past Surgical History  Procedure Laterality Date  . Appendectomy    . Total abdominal hysterectomy    . Bilateral salpingoophorectomy        Current Outpatient Prescriptions  Medication Sig Dispense Refill  . aspirin 81 MG tablet Take 81 mg by mouth daily.      . Calcium Carbonate-Vitamin D (CALCIUM  PLUS VITAMIN D PO) Take 1 tablet by mouth daily.      . fexofenadine (ALLEGRA) 180 MG tablet Take 180 mg by mouth daily.      . furosemide (LASIX) 20 MG tablet Take 20 mg by mouth daily.      Marland Kitchen glucosamine-chondroitin 500-400 MG tablet Take 1 tablet by mouth daily.      Marland Kitchen oxyCODONE-acetaminophen (TYLOX) 5-500 MG per capsule Take 1 capsule by mouth every 4 (four) hours as needed for pain.      . pantoprazole (PROTONIX) 40 MG tablet Take 40 mg by mouth daily.      . pravastatin (PRAVACHOL) 20 MG tablet       .  pregabalin (LYRICA) 25 MG capsule Take 25 mg by mouth as needed.      . temazepam (RESTORIL) 30 MG capsule Take 30 mg by mouth at bedtime as needed for sleep.      . traMADol (ULTRAM) 50 MG tablet Take 50 mg by mouth every 6 (six) hours as needed for pain.      . Vitamin D, Ergocalciferol, (DRISDOL) 50000 UNITS CAPS Take 50,000 Units by mouth every 7 (seven) days.       No current facility-administered medications for this visit.    No Known Allergies  REVIEW OF SYSTEMS: Skin:  No history of rash.  No history of abnormal moles. Infection:  No history of hepatitis or HIV.  No history of MRSA. Neurologic:  No history of stroke.  No history of seizure.  No history of headaches. Cardiac:  No history of hypertension. No history of heart disease.  No history of prior cardiac catheterization.  No history of seeing a cardiologist. Pulmonary:  Does not smoke cigarettes.  No asthma or bronchitis.  No OSA/CPAP.  Endocrine:  No diabetes. No thyroid disease. Gastrointestinal:  GERD.   No history of liver disease.  No history of gall bladder disease.  No history of pancreas disease.  No history of colon disease. Urologic:  No history of kidney stones.  No history of bladder infections. Musculoskeletal:  DJD of back Hematologic:  No bleeding disorder.  No history of anemia.  Not anticoagulated. Psycho-social:  The patient is oriented.   The patient has no obvious psychologic or social impairment to understanding our conversation and plan.  SOCIAL and FAMILY HISTORY: Her husband is with her. She has one son.  PHYSICAL EXAM: There were no vitals taken for this visit.  General: WNWF who is alert and generally healthy appearing.  HEENT: Normal. Pupils equal. Neck: Supple. No mass.  No thyroid mass. Lymph Nodes:  No supraclavicular, cervical, or axillary nodes. Lungs: Clear to auscultation and symmetric breath sounds. Heart:  RRR. No murmur or rub. Breast:  Right - 3 o'clock bruise with mass  effect  Left - No mass  Abdomen: Soft. No mass. No tenderness. No hernia. Normal bowel sounds.  No abdominal scars. Rectal: Not done. Extremities:  Good strength and ROM  in upper and lower extremities. Neurologic:  Grossly intact to motor and sensory function. Psychiatric: Has normal mood and affect. Behavior is normal.   DATA REVIEWED: Epic notes  Alphonsa Overall, MD,  [facility] [address] [place]  [place], [place]    [place] Phone:  [place]:  [phone]

## 2014-08-13 NOTE — Progress Notes (Signed)
[name] Psychosocial Distress Screening  Clinical Social Work   Patient completed distress screening protocol and scored a 9 on the Psychosocial Distress Thermometer which indicates severe distress. Clinical Social Worker met with patient and patients husband in [facility] to assess for distress and other psychosocial needs. Patient expressed feeling overwhelmed and "in shock" as she was not expecting the news she was given.  Patient stated that the information she received was difficult, but it was helpful meeting with the physicians and getting more information on her treatment plan. CSW and patient discussed common emotional responses to being diagnosed with cancer and the importance of support. Patient identified her family as a source of support, but also expressed concern for how her diagnosis and treatment would effect them. CSW and patient discussed taking treatment one step at a time and the importance of self care.  CSW informed patient on the support team and support services at [facility], and patient was agreeable to an alight guide referral. CSW encouraged patient and family to call with any questions or concerns.     Clinical Social Worker follow up needed: Yes.   If yes, follow up plan:  Alight Guide referral   [place] 08/13/2014  Screening Type Initial Screening  Mark the number that describes how much distress you have been experiencing in the past week 9  Emotional problem type Adjusting to illness  Referral to clinical social work Yes  Referral to support programs Yes   Johnnye Lana, MSW, LCSW, OSW-C Clinical Social Worker [place] [phone]

## 2014-08-14 ENCOUNTER — Telehealth: Payer: Self-pay | Admitting: *Deleted

## 2014-08-14 ENCOUNTER — Other Ambulatory Visit: Payer: Medicare Other

## 2014-08-14 ENCOUNTER — Ambulatory Visit
Admission: RE | Admit: 2014-08-14 | Discharge: 2014-08-14 | Disposition: A | Discharge status: Home / Self Care | Payer: Medicare Other | Source: Ambulatory Visit | Attending: Hematology and Oncology | Admitting: Hematology and Oncology

## 2014-08-14 DIAGNOSIS — C50911 Malignant neoplasm of unspecified site of right female breast: Secondary | ICD-10-CM

## 2014-08-14 MED ORDER — GADOBENATE DIMEGLUMINE 529 MG/ML IV SOLN: 15.0000 mL | Freq: Once | INTRAVENOUS | Status: AC | PRN

## 2014-08-14 NOTE — Telephone Encounter (Signed)
Pt called with concerns about having an IV started in her Right arm.  Pt referred to Dr. Lindi Adie.

## 2014-08-19 ENCOUNTER — Encounter: Payer: Self-pay | Admitting: *Deleted

## 2014-08-19 ENCOUNTER — Encounter (HOSPITAL_COMMUNITY): Payer: Self-pay

## 2014-08-19 ENCOUNTER — Other Ambulatory Visit: Payer: Medicare Other

## 2014-08-19 ENCOUNTER — Telehealth: Payer: Self-pay | Admitting: *Deleted

## 2014-08-19 ENCOUNTER — Ambulatory Visit (HOSPITAL_COMMUNITY)
Admission: RE | Admit: 2014-08-19 | Discharge: 2014-08-19 | Disposition: A | Discharge status: Home / Self Care | Payer: Medicare Other | Source: Ambulatory Visit | Attending: Cardiology | Admitting: Cardiology

## 2014-08-19 DIAGNOSIS — I519 Heart disease, unspecified: Secondary | ICD-10-CM

## 2014-08-19 DIAGNOSIS — E785 Hyperlipidemia, unspecified: Secondary | ICD-10-CM | POA: Diagnosis not present

## 2014-08-19 DIAGNOSIS — I5189 Other ill-defined heart diseases: Secondary | ICD-10-CM | POA: Insufficient documentation

## 2014-08-19 DIAGNOSIS — C50919 Malignant neoplasm of unspecified site of unspecified female breast: Secondary | ICD-10-CM | POA: Insufficient documentation

## 2014-08-19 DIAGNOSIS — C50211 Malignant neoplasm of upper-inner quadrant of right female breast: Secondary | ICD-10-CM

## 2014-08-19 NOTE — Telephone Encounter (Signed)
Spoke to pt concerning [place] from 08/13/14. Pt denies questions or concerns regarding dx or treatment care plan. Confirmed port date and scheduled and confirmed f/u with Dr. Lindi Adie. Encourage pt to call with needs. Received verbal understanding. Contact information given.

## 2014-08-19 NOTE — Progress Notes (Signed)
  Echocardiogram 2D Echocardiogram has been performed.  Kelli Owens 08/19/2014, 11:59 AM

## 2014-08-19 NOTE — Telephone Encounter (Signed)
Left vm for pt return call to discuss [place] from 08/13/14.

## 2014-08-20 NOTE — Progress Notes (Signed)
  Please put orders in Epic for same day surgery 08-26-14 Thanks

## 2014-08-21 ENCOUNTER — Encounter (HOSPITAL_COMMUNITY): Payer: Self-pay | Admitting: *Deleted

## 2014-08-24 ENCOUNTER — Other Ambulatory Visit (INDEPENDENT_AMBULATORY_CARE_PROVIDER_SITE_OTHER): Payer: Self-pay | Admitting: Surgery

## 2014-08-26 ENCOUNTER — Ambulatory Visit (HOSPITAL_COMMUNITY): Payer: Medicare Other | Admitting: Anesthesiology

## 2014-08-26 ENCOUNTER — Ambulatory Visit (HOSPITAL_COMMUNITY)
Admission: RE | Admit: 2014-08-26 | Discharge: 2014-08-26 | Disposition: A | Discharge status: Home / Self Care | Payer: Medicare Other | Source: Ambulatory Visit | Attending: Surgery | Admitting: Surgery

## 2014-08-26 ENCOUNTER — Encounter (HOSPITAL_COMMUNITY)
Admission: RE | Disposition: A | Discharge status: Home / Self Care | Payer: Self-pay | Source: Ambulatory Visit | Attending: Surgery

## 2014-08-26 ENCOUNTER — Encounter (HOSPITAL_COMMUNITY): Payer: Self-pay | Admitting: *Deleted

## 2014-08-26 ENCOUNTER — Ambulatory Visit (HOSPITAL_COMMUNITY): Payer: Medicare Other

## 2014-08-26 DIAGNOSIS — K2270 Barrett's esophagus without dysplasia: Secondary | ICD-10-CM | POA: Insufficient documentation

## 2014-08-26 DIAGNOSIS — K219 Gastro-esophageal reflux disease without esophagitis: Secondary | ICD-10-CM | POA: Diagnosis not present

## 2014-08-26 DIAGNOSIS — E785 Hyperlipidemia, unspecified: Secondary | ICD-10-CM | POA: Insufficient documentation

## 2014-08-26 DIAGNOSIS — E559 Vitamin D deficiency, unspecified: Secondary | ICD-10-CM | POA: Diagnosis not present

## 2014-08-26 DIAGNOSIS — Z7982 Long term (current) use of aspirin: Secondary | ICD-10-CM | POA: Diagnosis not present

## 2014-08-26 DIAGNOSIS — M8580 Other specified disorders of bone density and structure, unspecified site: Secondary | ICD-10-CM | POA: Diagnosis not present

## 2014-08-26 DIAGNOSIS — C50911 Malignant neoplasm of unspecified site of right female breast: Secondary | ICD-10-CM | POA: Insufficient documentation

## 2014-08-26 DIAGNOSIS — F329 Major depressive disorder, single episode, unspecified: Secondary | ICD-10-CM | POA: Insufficient documentation

## 2014-08-26 DIAGNOSIS — E70318 Other ocular albinism: Secondary | ICD-10-CM | POA: Diagnosis not present

## 2014-08-26 DIAGNOSIS — M5136 Other intervertebral disc degeneration, lumbar region: Secondary | ICD-10-CM | POA: Insufficient documentation

## 2014-08-26 DIAGNOSIS — F419 Anxiety disorder, unspecified: Secondary | ICD-10-CM | POA: Diagnosis not present

## 2014-08-26 DIAGNOSIS — M797 Fibromyalgia: Secondary | ICD-10-CM | POA: Insufficient documentation

## 2014-08-26 DIAGNOSIS — C50211 Malignant neoplasm of upper-inner quadrant of right female breast: Secondary | ICD-10-CM

## 2014-08-26 HISTORY — PX: PORTACATH PLACEMENT: SHX2246

## 2014-08-26 SURGERY — INSERTION, TUNNELED CENTRAL VENOUS DEVICE, WITH PORT
Anesthesia: General | Site: Chest | Laterality: Left

## 2014-08-26 MED ORDER — PROMETHAZINE HCL 25 MG/ML IJ SOLN: 6.2500 mg | INTRAMUSCULAR | Status: DC | PRN

## 2014-08-26 MED ORDER — FENTANYL CITRATE 0.05 MG/ML IJ SOLN
INTRAMUSCULAR | Status: DC | PRN
  Administered 2014-08-26: 25 ug via INTRAVENOUS
  Administered 2014-08-26: 50 ug via INTRAVENOUS
  Administered 2014-08-26: 25 ug via INTRAVENOUS
  Administered 2014-08-26: 50 ug via INTRAVENOUS

## 2014-08-26 MED ORDER — ONDANSETRON HCL 4 MG/2ML IJ SOLN
INTRAMUSCULAR | Status: DC | PRN
  Administered 2014-08-26 (×2): 2 mg via INTRAVENOUS

## 2014-08-26 MED ORDER — LACTATED RINGERS IV SOLN
INTRAVENOUS | Status: DC
  Administered 2014-08-26: 1000 mL via INTRAVENOUS

## 2014-08-26 MED ORDER — SODIUM CHLORIDE 0.9 % IR SOLN
Status: DC | PRN
  Administered 2014-08-26: 1000 mL

## 2014-08-26 MED ORDER — PROPOFOL 10 MG/ML IV BOLUS
INTRAVENOUS | Status: AC
Start: 2014-08-26 — End: 2014-08-26
  Filled 2014-08-26: qty 20

## 2014-08-26 MED ORDER — LIDOCAINE HCL (PF) 1 % IJ SOLN
INTRAMUSCULAR | Status: DC | PRN
  Administered 2014-08-26: 9 mL

## 2014-08-26 MED ORDER — LIDOCAINE-PRILOCAINE 2.5-2.5 % EX CREA: 1.0000 "application " | TOPICAL_CREAM | CUTANEOUS | Status: DC | PRN

## 2014-08-26 MED ORDER — PROPOFOL 10 MG/ML IV BOLUS
INTRAVENOUS | Status: DC | PRN
  Administered 2014-08-26: 175 mg via INTRAVENOUS

## 2014-08-26 MED ORDER — SODIUM CHLORIDE 0.9 % IR SOLN
Freq: Once | Status: AC
  Administered 2014-08-26: 500 mL
  Filled 2014-08-26: qty 1.2

## 2014-08-26 MED ORDER — OXYCODONE HCL 5 MG/5ML PO SOLN
5.0000 mg | Freq: Once | ORAL | Status: DC | PRN
  Filled 2014-08-26: qty 5

## 2014-08-26 MED ORDER — OXYCODONE HCL 5 MG PO TABS: 5.0000 mg | ORAL_TABLET | Freq: Once | ORAL | Status: DC | PRN

## 2014-08-26 MED ORDER — LACTATED RINGERS IV SOLN
INTRAVENOUS | Status: DC | PRN
  Administered 2014-08-26 (×2): via INTRAVENOUS

## 2014-08-26 MED ORDER — MIDAZOLAM HCL 5 MG/5ML IJ SOLN
INTRAMUSCULAR | Status: DC | PRN
  Administered 2014-08-26 (×2): 0.5 mg via INTRAVENOUS

## 2014-08-26 MED ORDER — BUPIVACAINE HCL (PF) 0.25 % IJ SOLN
INTRAMUSCULAR | Status: AC
  Filled 2014-08-26: qty 30

## 2014-08-26 MED ORDER — HEPARIN SOD (PORK) LOCK FLUSH 100 UNIT/ML IV SOLN
INTRAVENOUS | Status: AC
  Filled 2014-08-26: qty 5

## 2014-08-26 MED ORDER — HYDROMORPHONE HCL 1 MG/ML IJ SOLN
0.2500 mg | INTRAMUSCULAR | Status: DC | PRN
  Administered 2014-08-26: 0.5 mg via INTRAVENOUS

## 2014-08-26 MED ORDER — CEFAZOLIN SODIUM-DEXTROSE 2-3 GM-% IV SOLR
2.0000 g | INTRAVENOUS | Status: AC
  Administered 2014-08-26: 2 g via INTRAVENOUS

## 2014-08-26 MED ORDER — DEXAMETHASONE SODIUM PHOSPHATE 10 MG/ML IJ SOLN
INTRAMUSCULAR | Status: AC
  Filled 2014-08-26: qty 1

## 2014-08-26 MED ORDER — CHLORHEXIDINE GLUCONATE 4 % EX LIQD: 1.0000 "application " | Freq: Once | CUTANEOUS | Status: DC

## 2014-08-26 MED ORDER — DEXAMETHASONE SODIUM PHOSPHATE 10 MG/ML IJ SOLN
INTRAMUSCULAR | Status: DC | PRN
  Administered 2014-08-26: 10 mg via INTRAVENOUS

## 2014-08-26 MED ORDER — FENTANYL CITRATE 0.05 MG/ML IJ SOLN
INTRAMUSCULAR | Status: AC
  Filled 2014-08-26: qty 5

## 2014-08-26 MED ORDER — LIDOCAINE HCL 1 % IJ SOLN
INTRAMUSCULAR | Status: AC
  Filled 2014-08-26: qty 20

## 2014-08-26 MED ORDER — HEPARIN SOD (PORK) LOCK FLUSH 100 UNIT/ML IV SOLN
INTRAVENOUS | Status: DC | PRN
  Administered 2014-08-26: 400 [IU]

## 2014-08-26 MED ORDER — HYDROCODONE-ACETAMINOPHEN 5-325 MG PO TABS: 1.0000 | ORAL_TABLET | Freq: Four times a day (QID) | ORAL | Status: DC | PRN

## 2014-08-26 MED ORDER — LIDOCAINE HCL (CARDIAC) 20 MG/ML IV SOLN
INTRAVENOUS | Status: DC | PRN
  Administered 2014-08-26: 50 mg via INTRAVENOUS

## 2014-08-26 MED ORDER — ONDANSETRON HCL 4 MG/2ML IJ SOLN
INTRAMUSCULAR | Status: AC
  Filled 2014-08-26: qty 2

## 2014-08-26 MED ORDER — HYDROMORPHONE HCL 1 MG/ML IJ SOLN
INTRAMUSCULAR | Status: AC
  Filled 2014-08-26: qty 1

## 2014-08-26 MED ORDER — LIDOCAINE HCL (CARDIAC) 20 MG/ML IV SOLN
INTRAVENOUS | Status: AC
  Filled 2014-08-26: qty 5

## 2014-08-26 MED ORDER — MIDAZOLAM HCL 2 MG/2ML IJ SOLN
INTRAMUSCULAR | Status: AC
  Filled 2014-08-26: qty 2

## 2014-08-26 MED ORDER — CEFAZOLIN SODIUM-DEXTROSE 2-3 GM-% IV SOLR
INTRAVENOUS | Status: AC
  Filled 2014-08-26: qty 50

## 2014-08-26 SURGICAL SUPPLY — 37 items
APL SKNCLS STERI-STRIP NONHPOA (GAUZE/BANDAGES/DRESSINGS) ×1
BAG DECANTER FOR FLEXI CONT (MISCELLANEOUS) ×3 IMPLANT
BENZOIN TINCTURE PRP APPL 2/3 (GAUZE/BANDAGES/DRESSINGS) ×3 IMPLANT
BLADE HEX COATED 2.75 (ELECTRODE) ×3 IMPLANT
BLADE SURG 15 STRL LF DISP TIS (BLADE) ×1 IMPLANT
BLADE SURG 15 STRL SS (BLADE) ×3
CHLORAPREP W/TINT 10.5 ML (MISCELLANEOUS) ×3 IMPLANT
CLOSURE WOUND 1/4X4 (GAUZE/BANDAGES/DRESSINGS) ×1
DECANTER SPIKE VIAL GLASS SM (MISCELLANEOUS) ×3 IMPLANT
DRAPE C-ARM 42X120 X-RAY (DRAPES) ×3 IMPLANT
DRAPE LAPAROTOMY TRNSV 102X78 (DRAPE) ×3 IMPLANT
ELECT REM PT RETURN 9FT ADLT (ELECTROSURGICAL) ×3
ELECTRODE REM PT RTRN 9FT ADLT (ELECTROSURGICAL) ×1 IMPLANT
GAUZE SPONGE 2X2 8PLY STRL LF (GAUZE/BANDAGES/DRESSINGS) ×1 IMPLANT
GAUZE SPONGE 4X4 12PLY STRL (GAUZE/BANDAGES/DRESSINGS) ×4 IMPLANT
GAUZE SPONGE 4X4 16PLY XRAY LF (GAUZE/BANDAGES/DRESSINGS) ×3 IMPLANT
GLOVE SURG SIGNA 7.5 PF LTX (GLOVE) ×3 IMPLANT
GOWN SPEC L4 XLG W/TWL (GOWN DISPOSABLE) ×3 IMPLANT
GOWN STRL REUS W/ TWL XL LVL3 (GOWN DISPOSABLE) ×3 IMPLANT
GOWN STRL REUS W/TWL XL LVL3 (GOWN DISPOSABLE) ×9
KIT BASIN OR (CUSTOM PROCEDURE TRAY) ×3 IMPLANT
KIT PORT POWER 8FR ISP CVUE (Catheter) ×2 IMPLANT
KIT POWER CATH 8FR (Catheter) IMPLANT
NDL HYPO 25X1 1.5 SAFETY (NEEDLE) ×1 IMPLANT
NEEDLE HYPO 25X1 1.5 SAFETY (NEEDLE) ×3 IMPLANT
NS IRRIG 1000ML POUR BTL (IV SOLUTION) ×3 IMPLANT
PACK BASIC VI WITH GOWN DISP (CUSTOM PROCEDURE TRAY) ×3 IMPLANT
PENCIL BUTTON HOLSTER BLD 10FT (ELECTRODE) ×3 IMPLANT
SPONGE GAUZE 2X2 STER 10/PKG (GAUZE/BANDAGES/DRESSINGS) ×2
STRIP CLOSURE SKIN 1/4X4 (GAUZE/BANDAGES/DRESSINGS) ×2 IMPLANT
SUT VIC AB 3-0 SH 18 (SUTURE) ×3 IMPLANT
SUT VIC AB 5-0 PS2 18 (SUTURE) ×3 IMPLANT
SYR 20CC LL (SYRINGE) ×3 IMPLANT
SYR BULB IRRIGATION 50ML (SYRINGE) IMPLANT
SYRINGE 10CC LL (SYRINGE) ×3 IMPLANT
TAPE CLOTH SURG 4X10 WHT LF (GAUZE/BANDAGES/DRESSINGS) ×2 IMPLANT
TOWEL OR 17X26 10 PK STRL BLUE (TOWEL DISPOSABLE) ×3 IMPLANT

## 2014-08-26 NOTE — Anesthesia Preprocedure Evaluation (Addendum)
Anesthesia Evaluation  Patient identified by MRN, date of birth, ID band Patient awake    Reviewed: Allergy & Precautions, H&P , NPO status , Patient's Chart, lab work & pertinent test results  Airway Mallampati: III  TM Distance: >3 FB Neck ROM: Full    Dental  (+) Teeth Intact, Dental Advisory Given   Pulmonary neg pulmonary ROS,          Cardiovascular negative cardio ROS      Neuro/Psych Anxiety Depression negative neurological ROS     GI/Hepatic Neg liver ROS, GERD-  ,  Endo/Other  Morbid obesity  Renal/GU negative Renal ROS     Musculoskeletal  (+) Arthritis -, Fibromyalgia -  Abdominal   Peds  Hematology  (+) anemia ,   Anesthesia Other Findings   Reproductive/Obstetrics                            Anesthesia Physical Anesthesia Plan  ASA: III  Anesthesia Plan: General   Post-op Pain Management:    Induction: Intravenous  Airway Management Planned: LMA and Oral ETT  Additional Equipment:   Intra-op Plan:   Post-operative Plan: Extubation in OR  Informed Consent: I have reviewed the patients History and Physical, chart, labs and discussed the procedure including the risks, benefits and alternatives for the proposed anesthesia with the patient or authorized representative who has indicated his/her understanding and acceptance.   Dental advisory given  Plan Discussed with: CRNA and Surgeon  Anesthesia Plan Comments:         Anesthesia Quick Evaluation

## 2014-08-26 NOTE — Interval H&P Note (Signed)
History and Physical Interval Note:  08/26/2014 10:57 AM  Kelli Owens  has presented today for surgery, with the diagnosis of right breast cancer  The various methods of treatment have been discussed with the patient and family. Her husband is with her.  She has been to the chemo class.  After consideration of risks, benefits and other options for treatment, the patient has consented to  Procedure(s): INSERTION PORT-A-CATH (N/A) as a surgical intervention .  The patient's history has been reviewed, patient examined, no change in status, stable for surgery.  I have reviewed the patient's chart and labs.  Questions were answered to the patient's satisfaction.     Patricia Fargo H

## 2014-08-26 NOTE — Op Note (Signed)
08/26/2014  2:43 PM  PATIENT:  Kelli Owens, 73 y.o., female MRN: [id number] DOB: 11-14-1940  PREOP DIAGNOSIS:  right breast cancer (Her2Neu positive), anticipate chemotherapy  POSTOP DIAGNOSIS:   right breast cancer (Her2Neu positive), anticipate chemotherapy  PROCEDURE:   Procedure(s): INSERTION PORT-A-CATH  ([place] Power Port)  SURGEON:   Alphonsa Overall, M.D.  ANESTHESIA:   general  Anesthesiologist: Tiajuana Amass, MD CRNA: Ofilia Neas, CRNA  General  EBL:  minimal  ml  COUNTS CORRECT:  YES  INDICATIONS FOR PROCEDURE:  Kelli Owens is a 73 y.o. (DOB: 12-Mar-1941) white female whose primary care physician is PERINI,MARK A, MD and comes for power port placement for the treatment of right breast cancer.  Dr. Lindi Adie is her treating oncologist.   The indications and risks of the surgery were explained to the patient.  The risks include, but are not limited to, infection, bleeding, pneumothorax, nerve injury, and thrombosis of the vein.  OPERATIVE NOTE:  The patient was taken to [facility] #[facility] at [facility].  Anesthesia was provided by Anesthesiologist: Tiajuana Amass, MD CRNA: Ofilia Neas, CRNA.  At the beginning of the operation, the patient was given 2 gm Ancef, had a roll placed under her back, and had the upper chest/neck prepped with Chloroprep and draped.   A time out was held and the surgery checklist reviewed.   The patient was placed in Trendelenburg position.  The left subclavian vein was accessed with a 16 gauge needle and a guide wire threaded through the needle into the vein.  The position of the wire was checked with fluoroscopy.   I then developed a pocket in the upper inner aspect of the left chest for the port reservoir.  I used the [employer] for venous access.  The reservoir was sewn in place with a 3-0 Vicryl suture.  The reservoir had been flushed with dilute (10 units/cc) heparin.   I then passed the silastic tubing from the  reservoir incision to the subclavian stick site and used the 8 French introducer to pass it into the vein.  The tip of the silastic catheter was position at the junction of the SVC and the right atrium under fluoroscopy.  The silastic catheter was then attached to the port with the bayonet device.     The entire port and tubing were checked with fluoroscopy and then the port was flushed with 4 cc of concentrated heparin (100 units/cc).   The wounds were then closed with 3-0 vicryl subcutaneous sutures and the skin closed with a 5-0 Vicryl suture.  The skin was painted with tincture of benzoin and steri-stripped.   The patient was transferred to the recovery room in good condition.  The sponge and needle count were correct at the end of the case.  A CXR is ordered for port placement and pending at the time of this note.  Alphonsa Overall, MD, [facility] Pager: [phone] Office phone:  [phone]

## 2014-08-26 NOTE — Transfer of Care (Signed)
Immediate Anesthesia Transfer of Care Note  Patient: Kelli Owens  Procedure(s) Performed: Procedure(s): INSERTION PORT-A-CATH (Left)  Patient Location: PACU  Anesthesia Type:General  Level of Consciousness: awake and patient cooperative  Airway & Oxygen Therapy: Patient Spontanous Breathing and Patient connected to face mask oxygen  Post-op Assessment: Report given to PACU RN, Post -op Vital signs reviewed and stable and Patient moving all extremities  Post vital signs: Reviewed and stable  Complications: No apparent anesthesia complications

## 2014-08-26 NOTE — H&P (View-Only) (Signed)
Re:   Kelli Owens DOB:   Jan 21, 1941 MRN:   [id number]  [facility]  ASSESSMENT AND PLAN: 1.  Right breast cancer, 3 o'clock  Path - 08/06/2014 ([id number]) - IDC, Her2Neu - positive  Treating oncologist - Gudena/Kinard   I discussed the options for breast cancer treatment with the patient.  She is in the multidisciplinary clinic and understands the treatment of breast cancer includes medical oncology and radiation oncology.  I discussed the surgical options of lumpectomy vs. mastectomy.  If mastectomy, there is the possibility of reconstruction.   I discussed the options of lymph node biopsy.  The treatment plan depends on the pathologic staging of the tumor and the patient's personal wishes.  The risks of surgery include, but are not limited to, bleeding, infection, the need for further surgery, and nerve injury.  The patient has been given literature on the treatment of breast cancer.  Plan:1)  MRI to better define cancer, 2) power port placement, 3) neoadjuvant chemotherapy, 4) Right lumpectomy and right axillary SLNBx  2.  Back pain - DJD of back   No chief complaint on file.  REFERRING PHYSICIAN: Jerlyn Ly, MD  HISTORY OF PRESENT ILLNESS: Kelli Owens is a 73 y.o. (DOB: [birth year])  white  female whose primary care physician is Jerlyn Ly, MD and comes to [facility] today for right breast cancer.  She had her routine mammogram and there was a mass found in her right breast.  She had a hysterectomy at age 15.  She took hormones early after surgery, but not recently.  No family history of breast cancer.  She had a sister who had colon ca.  Her husband has an impant for parkinson's disease at [facility]. Dr. Clinton Sawyer (at [facility]) suggested she see someone there for the cancer. 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 For now she is here.  Right breast cancer, 3 o'clock Path - 08/06/2014 ([id number]) - IDC, Her2Neu - positive. ER/PR -  negative.  I discussed the options for breast cancer treatment with the patient. She is in the multidisciplinary clinic and understands the treatment of breast cancer includes medical oncology and radiation oncology. I discussed the surgical options of lumpectomy vs. mastectomy. If mastectomy, there is the possibility of reconstruction. I discussed the options of lymph node biopsy. The treatment plan depends on the pathologic staging of the tumor and the patient's personal wishes. The risks of surgery include, but are not limited to, bleeding, infection, the need for further surgery, and nerve injury. The patient has been given literature on the treatment of breast cancer.   Past Medical History  Diagnosis Date  . Depression   . Anxiety   . GERD (gastroesophageal reflux disease)   . Anemia   . Dyslipidemia   . DDD (degenerative disc disease), lumbar   . Osteopenia   . Fibromyalgia   . OA (ocular albinism)   . HSV infection   . Vitamin D deficiency   . Whooping cough   . Barrett's esophagus   . Breast cancer of upper-inner quadrant of right female breast 08/08/2014      Past Surgical History  Procedure Laterality Date  . Appendectomy    . Total abdominal hysterectomy    . Bilateral salpingoophorectomy        Current Outpatient Prescriptions  Medication Sig Dispense Refill  . aspirin 81 MG tablet Take 81 mg by mouth daily.      . Calcium Carbonate-Vitamin D (CALCIUM  PLUS VITAMIN D PO) Take 1 tablet by mouth daily.      . fexofenadine (ALLEGRA) 180 MG tablet Take 180 mg by mouth daily.      . furosemide (LASIX) 20 MG tablet Take 20 mg by mouth daily.      Marland Kitchen glucosamine-chondroitin 500-400 MG tablet Take 1 tablet by mouth daily.      Marland Kitchen oxyCODONE-acetaminophen (TYLOX) 5-500 MG per capsule Take 1 capsule by mouth every 4 (four) hours as needed for pain.      . pantoprazole (PROTONIX) 40 MG tablet Take 40 mg by mouth daily.      . pravastatin (PRAVACHOL) 20 MG tablet       .  pregabalin (LYRICA) 25 MG capsule Take 25 mg by mouth as needed.      . temazepam (RESTORIL) 30 MG capsule Take 30 mg by mouth at bedtime as needed for sleep.      . traMADol (ULTRAM) 50 MG tablet Take 50 mg by mouth every 6 (six) hours as needed for pain.      . Vitamin D, Ergocalciferol, (DRISDOL) 50000 UNITS CAPS Take 50,000 Units by mouth every 7 (seven) days.       No current facility-administered medications for this visit.    No Known Allergies  REVIEW OF SYSTEMS: Skin:  No history of rash.  No history of abnormal moles. Infection:  No history of hepatitis or HIV.  No history of MRSA. Neurologic:  No history of stroke.  No history of seizure.  No history of headaches. Cardiac:  No history of hypertension. No history of heart disease.  No history of prior cardiac catheterization.  No history of seeing a cardiologist. Pulmonary:  Does not smoke cigarettes.  No asthma or bronchitis.  No OSA/CPAP.  Endocrine:  No diabetes. No thyroid disease. Gastrointestinal:  GERD.   No history of liver disease.  No history of gall bladder disease.  No history of pancreas disease.  No history of colon disease. Urologic:  No history of kidney stones.  No history of bladder infections. Musculoskeletal:  DJD of back Hematologic:  No bleeding disorder.  No history of anemia.  Not anticoagulated. Psycho-social:  The patient is oriented.   The patient has no obvious psychologic or social impairment to understanding our conversation and plan.  SOCIAL and FAMILY HISTORY: Her husband is with her. She has one son.  PHYSICAL EXAM: There were no vitals taken for this visit.  General: WNWF who is alert and generally healthy appearing.  HEENT: Normal. Pupils equal. Neck: Supple. No mass.  No thyroid mass. Lymph Nodes:  No supraclavicular, cervical, or axillary nodes. Lungs: Clear to auscultation and symmetric breath sounds. Heart:  RRR. No murmur or rub. Breast:  Right - 3 o'clock bruise with mass  effect  Left - No mass  Abdomen: Soft. No mass. No tenderness. No hernia. Normal bowel sounds.  No abdominal scars. Rectal: Not done. Extremities:  Good strength and ROM  in upper and lower extremities. Neurologic:  Grossly intact to motor and sensory function. Psychiatric: Has normal mood and affect. Behavior is normal.   DATA REVIEWED: Epic notes  Alphonsa Overall, MD,  [facility] [address] [place]  [place], [place]    [place] Phone:  [place]:  [phone]

## 2014-08-26 NOTE — Anesthesia Postprocedure Evaluation (Signed)
  Anesthesia Post-op Note  Patient: Kelli Owens  Procedure(s) Performed: Procedure(s): INSERTION PORT-A-CATH (Left)  Patient Location: PACU  Anesthesia Type:General  Level of Consciousness: awake, alert  and oriented  Airway and Oxygen Therapy: Patient Spontanous Breathing  Post-op Pain: none  Post-op Assessment: Post-op Vital signs reviewed  Post-op Vital Signs: Reviewed  Last Vitals:  Filed Vitals:   08/26/14 1300  BP: 143/64  Pulse: 52  Temp:   Resp: 15    Complications: No apparent anesthesia complications

## 2014-08-26 NOTE — Discharge Instructions (Signed)
[facility] - DISCHARGE INSTRUCTIONS TO PATIENT  Activity:  Driving - May drive tomorrow, if doing well.   Lifting - No limit  Wound Care:   Leave incisions dry for 2 days, then remove bandage and shower.  Diet:  As tolerated.  Follow up appointment:  Call Dr. Pollie Friar office [facility]) at [phone] for an appointment in 3 to 4 weeks.  Medications and dosages:  Resume your home medications.  You have a prescription for:  Vicodin and EMLA cream  Call Dr. Lucia Gaskins or his office  [phone]) if you have:  Temperature greater than 100.4,  Persistent nausea and vomiting,  Severe uncontrolled pain,  Redness, tenderness, or signs of infection (pain, swelling, redness, odor or green/yellow discharge around the site),  Difficulty breathing, headache or visual disturbances,  Any other questions or concerns you may have after discharge.  In an emergency, call 911 or go to an Emergency Department at a nearby hospital.

## 2014-08-27 ENCOUNTER — Encounter (HOSPITAL_COMMUNITY): Payer: Self-pay | Admitting: Surgery

## 2014-08-28 ENCOUNTER — Other Ambulatory Visit: Payer: Self-pay | Admitting: *Deleted

## 2014-08-28 ENCOUNTER — Ambulatory Visit (HOSPITAL_BASED_OUTPATIENT_CLINIC_OR_DEPARTMENT_OTHER): Payer: Medicare Other | Admitting: Hematology and Oncology

## 2014-08-28 DIAGNOSIS — Z171 Estrogen receptor negative status [ER-]: Secondary | ICD-10-CM

## 2014-08-28 DIAGNOSIS — C50811 Malignant neoplasm of overlapping sites of right female breast: Secondary | ICD-10-CM

## 2014-08-28 DIAGNOSIS — C50211 Malignant neoplasm of upper-inner quadrant of right female breast: Secondary | ICD-10-CM

## 2014-08-28 MED ORDER — DEXAMETHASONE 4 MG PO TABS: 4.0000 mg | ORAL_TABLET | Freq: Two times a day (BID) | ORAL | Status: DC

## 2014-08-28 MED ORDER — DEXAMETHASONE 4 MG PO TABS: 8.0000 mg | ORAL_TABLET | Freq: Two times a day (BID) | ORAL | Status: DC

## 2014-08-28 MED ORDER — PROCHLORPERAZINE MALEATE 10 MG PO TABS: 10.0000 mg | ORAL_TABLET | Freq: Four times a day (QID) | ORAL | Status: DC | PRN

## 2014-08-28 MED ORDER — ONDANSETRON HCL 8 MG PO TABS: 8.0000 mg | ORAL_TABLET | Freq: Two times a day (BID) | ORAL | Status: DC

## 2014-08-28 MED ORDER — LORAZEPAM 0.5 MG PO TABS: 0.5000 mg | ORAL_TABLET | Freq: Four times a day (QID) | ORAL | Status: DC | PRN

## 2014-08-28 NOTE — Progress Notes (Signed)
Patient Care Team: Jerlyn Ly, MD as PCP - General (Internal Medicine) Alphonsa Overall, MD as Consulting Physician (General Surgery) Rulon Eisenmenger, MD as Consulting Physician (Hematology and Oncology) Blair Promise, MD as Consulting Physician (Radiation Oncology)  DIAGNOSIS: Breast cancer of upper-inner quadrant of right female breast   Staging form: Breast, AJCC 7th Edition     Clinical: Stage IIA (T2, N0, cM0) - Signed by Rulon Eisenmenger, MD on 08/13/2014     Pathologic: No stage assigned - Unsigned   SUMMARY OF ONCOLOGIC HISTORY:   Breast cancer of upper-inner quadrant of right female breast   07/17/2014 Mammogram Right breast mass at 3:00 position by ultrasound measured 2.4 cm   08/06/2014 Initial Biopsy 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 She underwent a port placement and had done very well from it. She denies any new problems or concerns. She is going to get started with chemotherapy. She went through chemotherapy education as well as echocardiogram testing.  REVIEW OF SYSTEMS:   Constitutional: Denies fevers, chills or abnormal weight loss Eyes: Denies blurriness of vision Ears, nose, mouth, throat, and face: Denies mucositis or sore throat Respiratory: Denies cough, dyspnea or wheezes Cardiovascular: Denies palpitation, chest discomfort or lower extremity swelling Gastrointestinal:  Denies nausea, heartburn or change in bowel habits Skin: Denies abnormal skin rashes Lymphatics: Denies new lymphadenopathy or easy bruising Neurological:Denies numbness, tingling or new weaknesses Behavioral/Psych: Mood is stable, no new changes  All other systems were reviewed with the  patient and are negative.  I have reviewed the past medical history, past surgical history, social history and family history with the patient and they are unchanged from previous note.  ALLERGIES:  has No Known Allergies.  MEDICATIONS:  Current Outpatient Prescriptions  Medication Sig Dispense Refill  . cyanocobalamin (,VITAMIN B-12,) 1000 MCG/ML injection Inject 1,000 mcg into the muscle every 30 (thirty) days.    . DULoxetine (CYMBALTA) 30 MG capsule Take 30 mg by mouth daily.    . furosemide (LASIX) 20 MG tablet Take 20 mg by mouth daily.    Marland Kitchen HYDROcodone-acetaminophen (NORCO/VICODIN) 5-325 MG per tablet Take 1-2 tablets by mouth every 6 (six) hours as needed. 30 tablet 0  . lidocaine-prilocaine (EMLA) cream Apply 1 application topically as needed. 30 g 0  . Multiple Vitamin (MULTIVITAMIN WITH MINERALS) TABS tablet Take 1 tablet by mouth daily.    . pravastatin (PRAVACHOL) 20 MG tablet     . pregabalin (LYRICA) 25 MG capsule Take 25 mg by mouth 2 (two) times daily as needed (for pain).     . temazepam (RESTORIL) 30 MG capsule Take 30 mg by mouth at bedtime as needed for sleep.    . Vitamin D, Ergocalciferol, (DRISDOL) 50000 UNITS CAPS Take 50,000 Units by mouth every 7 (seven) days. Takes on Thursday or Friday    . dexamethasone (DECADRON) 4 MG tablet Take 1 tablet (4 mg total) by mouth 2 (two) times daily. Start the day before Taxotere. Then again the day after chemo for 3 days. 30 tablet 1  . LORazepam (ATIVAN) 0.5 MG tablet Take 1 tablet (0.5 mg total) by mouth every 6 (six) hours as needed (Nausea or vomiting). 30 tablet 0  . ondansetron (ZOFRAN) 8  MG tablet Take 1 tablet (8 mg total) by mouth 2 (two) times daily. Start the day after chemo for 3 days. Then take as needed for nausea or vomiting. 30 tablet 1  . prochlorperazine (COMPAZINE) 10 MG tablet Take 1 tablet (10 mg total) by mouth every 6 (six) hours as needed (Nausea or vomiting). 30 tablet 1   No current facility-administered  medications for this visit.    PHYSICAL EXAMINATION: ECOG PERFORMANCE STATUS: 0 - Asymptomatic  Filed Vitals:   08/28/14 1605  BP: 134/59  Pulse: 62  Temp: 97.8 F (36.6 C)  Resp: 18   Filed Weights   08/28/14 1605  Weight: 171 lb 1.6 oz (77.61 kg)    GENERAL:alert, no distress and comfortable SKIN: skin color, texture, turgor are normal, no rashes or significant lesions EYES: normal, Conjunctiva are pink and non-injected, sclera clear OROPHARYNX:no exudate, no erythema and lips, buccal mucosa, and tongue normal  NECK: supple, thyroid normal size, non-tender, without nodularity LYMPH:  no palpable lymphadenopathy in the cervical, axillary or inguinal LUNGS: clear to auscultation and percussion with normal breathing effort HEART: regular rate & rhythm and no murmurs and no lower extremity edema ABDOMEN:abdomen soft, non-tender and normal bowel sounds Musculoskeletal:no cyanosis of digits and no clubbing  NEURO: alert & oriented x 3 with fluent speech, no focal motor/sensory deficits  LABORATORY DATA:  I have reviewed the data as listed   Chemistry      Component Value Date/Time   NA 143 08/13/2014 0820   K 3.9 08/13/2014 0820   CO2 23 08/13/2014 0820   BUN 16.5 08/13/2014 0820   CREATININE 0.9 08/13/2014 0820      Component Value Date/Time   CALCIUM 9.7 08/13/2014 0820   ALKPHOS 40 08/13/2014 0820   AST 15 08/13/2014 0820   ALT 20 08/13/2014 0820   BILITOT 0.37 08/13/2014 0820       Lab Results  Component Value Date   WBC 5.3 08/13/2014   HGB 12.0 08/13/2014   HCT 36.2 08/13/2014   MCV 94.4 08/13/2014   PLT 250 08/13/2014   NEUTROABS 2.8 08/13/2014    ASSESSMENT & PLAN:  Breast cancer of upper-inner quadrant of right female breast Right breast invasive mammary cancer most likely ductal phenotype grade 3, 2.4 cm mass at 3:00 position ER negative PR negative HER-2 positive ratio 2.75, Ki-67 80% clinical stage IIA, T2, N0, M0  Treatment plan: Patient will  start neoadjuvant chemotherapy with Taxotere, carboplatin, Herceptin and Perjeta next week. She will undergo 6 cycles of chemotherapy followed by Herceptin maintenance for one year. She will undergo surgery after that for 6 cycles of chemotherapy. Patient understands the treatment plan and she went for chemotherapy class.echocardiogram revealed an ejection fraction of 60-65%. She will start chemotherapy next week. Her port has been placed on 07/26/2014.  My plans to see her back one week after starting chemotherapy. I went over the antiemetics regimen in great detail. She understands this of questions have been answered. Patient signed consent form after understanding the risks and benefits of chemotherapy.   Orders Placed This Encounter  Procedures  . CBC with Differential    Standing Status: Standing     Number of Occurrences: 20     Standing Expiration Date: 08/29/2015  . Comprehensive metabolic panel    Standing Status: Standing     Number of Occurrences: 20     Standing Expiration Date: 08/29/2015   The patient has a good understanding of the overall plan. she agrees  with it. She will call with any problems that may develop before her next visit here.  I spent 25 minutes counseling the patient face to face. The total time spent in the appointment was 30 minutes and more than 50% was on counseling and review of test results    Rulon Eisenmenger, MD 08/28/2014 5:13 PM

## 2014-08-28 NOTE — Assessment & Plan Note (Signed)
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 She will undergo 6 cycles of chemotherapy followed by Herceptin maintenance for one year. She will undergo surgery after that for 6 cycles of chemotherapy. Patient understands the treatment plan and she went for chemotherapy class.echocardiogram revealed an ejection fraction of 60-65%. She will start chemotherapy next week. Her port has been placed on 07/26/2014.  My plans to see her back one week after starting chemotherapy. I went over the antiemetics regimen in great detail. She understands this of questions have been answered. Patient signed consent form after understanding the risks and benefits of chemotherapy.

## 2014-08-29 ENCOUNTER — Telehealth: Payer: Self-pay | Admitting: *Deleted

## 2014-08-29 ENCOUNTER — Telehealth: Payer: Self-pay

## 2014-08-29 NOTE — Telephone Encounter (Signed)
[facility] - d/t for chemo appt 11/19 830.  Pt to call clinic with questions.

## 2014-08-29 NOTE — Telephone Encounter (Signed)
Appt scheduled per staff message from desk RN

## 2014-08-31 ENCOUNTER — Other Ambulatory Visit: Payer: Self-pay

## 2014-09-01 ENCOUNTER — Telehealth: Payer: Self-pay | Admitting: Hematology and Oncology

## 2014-09-01 NOTE — Telephone Encounter (Signed)
lvm for pt regarding to NOV appts... °

## 2014-09-02 ENCOUNTER — Other Ambulatory Visit: Payer: Self-pay | Admitting: Hematology and Oncology

## 2014-09-03 ENCOUNTER — Other Ambulatory Visit: Payer: Self-pay | Admitting: Hematology and Oncology

## 2014-09-04 ENCOUNTER — Ambulatory Visit (HOSPITAL_BASED_OUTPATIENT_CLINIC_OR_DEPARTMENT_OTHER): Payer: Medicare Other

## 2014-09-04 DIAGNOSIS — Z5112 Encounter for antineoplastic immunotherapy: Secondary | ICD-10-CM

## 2014-09-04 DIAGNOSIS — C50211 Malignant neoplasm of upper-inner quadrant of right female breast: Secondary | ICD-10-CM

## 2014-09-04 DIAGNOSIS — C50811 Malignant neoplasm of overlapping sites of right female breast: Secondary | ICD-10-CM

## 2014-09-04 DIAGNOSIS — Z5111 Encounter for antineoplastic chemotherapy: Secondary | ICD-10-CM

## 2014-09-04 MED ORDER — ACETAMINOPHEN 325 MG PO TABS
ORAL_TABLET | ORAL | Status: AC
  Filled 2014-09-04: qty 2

## 2014-09-04 MED ORDER — ONDANSETRON 16 MG/50ML IVPB (CHCC)
16.0000 mg | Freq: Once | INTRAVENOUS | Status: AC
  Administered 2014-09-04: 16 mg via INTRAVENOUS

## 2014-09-04 MED ORDER — SODIUM CHLORIDE 0.9 % IJ SOLN
10.0000 mL | INTRAMUSCULAR | Status: DC | PRN
  Administered 2014-09-04: 10 mL
  Filled 2014-09-04: qty 10

## 2014-09-04 MED ORDER — SODIUM CHLORIDE 0.9 % IV SOLN
Freq: Once | INTRAVENOUS | Status: AC
  Administered 2014-09-04: 09:00:00 via INTRAVENOUS

## 2014-09-04 MED ORDER — DOCETAXEL CHEMO INJECTION 160 MG/16ML
75.0000 mg/m2 | Freq: Once | INTRAVENOUS | Status: AC
  Administered 2014-09-04: 140 mg via INTRAVENOUS
  Filled 2014-09-04: qty 14

## 2014-09-04 MED ORDER — DEXAMETHASONE SODIUM PHOSPHATE 20 MG/5ML IJ SOLN
20.0000 mg | Freq: Once | INTRAMUSCULAR | Status: AC
  Administered 2014-09-04: 20 mg via INTRAVENOUS

## 2014-09-04 MED ORDER — ONDANSETRON 16 MG/50ML IVPB (CHCC)
INTRAVENOUS | Status: AC
  Filled 2014-09-04: qty 16

## 2014-09-04 MED ORDER — DIPHENHYDRAMINE HCL 25 MG PO CAPS
50.0000 mg | ORAL_CAPSULE | Freq: Once | ORAL | Status: AC
  Administered 2014-09-04: 50 mg via ORAL

## 2014-09-04 MED ORDER — DEXAMETHASONE SODIUM PHOSPHATE 20 MG/5ML IJ SOLN
INTRAMUSCULAR | Status: AC
  Filled 2014-09-04: qty 5

## 2014-09-04 MED ORDER — SODIUM CHLORIDE 0.9 % IV SOLN
840.0000 mg | Freq: Once | INTRAVENOUS | Status: AC
  Administered 2014-09-04: 840 mg via INTRAVENOUS
  Filled 2014-09-04: qty 28

## 2014-09-04 MED ORDER — DIPHENHYDRAMINE HCL 25 MG PO CAPS
ORAL_CAPSULE | ORAL | Status: AC
  Filled 2014-09-04: qty 2

## 2014-09-04 MED ORDER — ACETAMINOPHEN 325 MG PO TABS
650.0000 mg | ORAL_TABLET | Freq: Once | ORAL | Status: AC
  Administered 2014-09-04: 650 mg via ORAL

## 2014-09-04 MED ORDER — HEPARIN SOD (PORK) LOCK FLUSH 100 UNIT/ML IV SOLN
500.0000 [IU] | Freq: Once | INTRAVENOUS | Status: AC | PRN
  Administered 2014-09-04: 500 [IU]
  Filled 2014-09-04: qty 5

## 2014-09-04 MED ORDER — TRASTUZUMAB CHEMO INJECTION 440 MG
8.0000 mg/kg | Freq: Once | INTRAVENOUS | Status: AC
  Administered 2014-09-04: 630 mg via INTRAVENOUS
  Filled 2014-09-04: qty 30

## 2014-09-04 MED ORDER — SODIUM CHLORIDE 0.9 % IV SOLN
510.0000 mg | Freq: Once | INTRAVENOUS | Status: AC
  Administered 2014-09-04: 510 mg via INTRAVENOUS
  Filled 2014-09-04: qty 51

## 2014-09-04 NOTE — Progress Notes (Unsigned)
Last labs 08/13/14 are within treatment parameters. OK to treat with these lab values on 09/04/14 per Dr. Lindi Adie.

## 2014-09-04 NOTE — Patient Instructions (Addendum)
[facility] Discharge Instructions for Patients Receiving Chemotherapy  Today you received the following chemotherapy agents: Herceptin, Perjeta, Taxotere, and Carboplatin   To help prevent nausea and vomiting after your treatment, we encourage you to take your nausea medication as prescribed. Dexamethasone: take 1 tablet twice a day for 3 days, starting Friday 09/05/14 Ondansetron: take 1 tablet twice a day for 3 days, starting Friday 09/05/14 Compazine: you may take 1 tablet every 6 hours as needed for nausea, you may start now; encouraged to take 1 tablet at bedtime on day of chemotherapy Ativan: you may take 1 tablet every 6 hours as needed for nausea; alternate with compazine and ondansetron   If you develop nausea and vomiting that is not controlled by your nausea medication, call the clinic.   BELOW ARE SYMPTOMS THAT SHOULD BE REPORTED IMMEDIATELY:  *FEVER GREATER THAN 100.5 F  *CHILLS WITH OR WITHOUT FEVER  NAUSEA AND VOMITING THAT IS NOT CONTROLLED WITH YOUR NAUSEA MEDICATION  *UNUSUAL SHORTNESS OF BREATH  *UNUSUAL BRUISING OR BLEEDING  TENDERNESS IN MOUTH AND THROAT WITH OR WITHOUT PRESENCE OF ULCERS  *URINARY PROBLEMS  *BOWEL PROBLEMS  UNUSUAL RASH Items with * indicate a potential emergency and should be followed up as soon as possible.  Feel free to call the clinic you have any questions or concerns. The clinic phone number is [phone].

## 2014-09-05 ENCOUNTER — Ambulatory Visit (HOSPITAL_BASED_OUTPATIENT_CLINIC_OR_DEPARTMENT_OTHER): Payer: Medicare Other

## 2014-09-05 DIAGNOSIS — C50811 Malignant neoplasm of overlapping sites of right female breast: Secondary | ICD-10-CM

## 2014-09-05 DIAGNOSIS — Z5189 Encounter for other specified aftercare: Secondary | ICD-10-CM

## 2014-09-05 DIAGNOSIS — C50211 Malignant neoplasm of upper-inner quadrant of right female breast: Secondary | ICD-10-CM

## 2014-09-05 MED ORDER — PEGFILGRASTIM INJECTION 6 MG/0.6ML ~~LOC~~
6.0000 mg | PREFILLED_SYRINGE | Freq: Once | SUBCUTANEOUS | Status: AC
  Administered 2014-09-05: 6 mg via SUBCUTANEOUS
  Filled 2014-09-05: qty 0.6

## 2014-09-05 NOTE — Patient Instructions (Signed)
Pegfilgrastim injection What is this medicine? PEGFILGRASTIM (peg fil GRA stim) is a long-acting granulocyte colony-stimulating factor that stimulates the growth of neutrophils, a type of white blood cell important in the body's fight against infection. It is used to reduce the incidence of fever and infection in patients with certain types of cancer who are receiving chemotherapy that affects the bone marrow. This medicine may be used for other purposes; ask your health care provider or pharmacist if you have questions. COMMON BRAND NAME(S): Neulasta What should I tell my health care provider before I take this medicine? They need to know if you have any of these conditions: -latex allergy -ongoing radiation therapy -sickle cell disease -skin reactions to acrylic adhesives (On-Body Injector only) -an unusual or allergic reaction to pegfilgrastim, filgrastim, other medicines, foods, dyes, or preservatives -pregnant or trying to get pregnant -breast-feeding How should I use this medicine? This medicine is for injection under the skin. If you get this medicine at home, you will be taught how to prepare and give the pre-filled syringe or how to use the On-body Injector. Refer to the patient Instructions for Use for detailed instructions. Use exactly as directed. Take your medicine at regular intervals. Do not take your medicine more often than directed. It is important that you put your used needles and syringes in a special sharps container. Do not put them in a trash can. If you do not have a sharps container, call your pharmacist or healthcare provider to get one. Talk to your pediatrician regarding the use of this medicine in children. Special care may be needed. Overdosage: If you think you have taken too much of this medicine contact a poison control center or emergency room at once. NOTE: This medicine is only for you. Do not share this medicine with others. What if I miss a dose? It is  important not to miss your dose. Call your doctor or health care professional if you miss your dose. If you miss a dose due to an On-body Injector failure or leakage, a new dose should be administered as soon as possible using a single prefilled syringe for manual use. What may interact with this medicine? Interactions have not been studied. Give your health care provider a list of all the medicines, herbs, non-prescription drugs, or dietary supplements you use. Also tell them if you smoke, drink alcohol, or use illegal drugs. Some items may interact with your medicine. This list may not describe all possible interactions. Give your health care provider a list of all the medicines, herbs, non-prescription drugs, or dietary supplements you use. Also tell them if you smoke, drink alcohol, or use illegal drugs. Some items may interact with your medicine. What should I watch for while using this medicine? You may need blood work done while you are taking this medicine. If you are going to need a MRI, CT scan, or other procedure, tell your doctor that you are using this medicine (On-Body Injector only). What side effects may I notice from receiving this medicine? Side effects that you should report to your doctor or health care professional as soon as possible: -allergic reactions like skin rash, itching or hives, swelling of the face, lips, or tongue -dizziness -fever -pain, redness, or irritation at site where injected -pinpoint red spots on the skin -shortness of breath or breathing problems -stomach or side pain, or pain at the shoulder -swelling -tiredness -trouble passing urine Side effects that usually do not require medical attention (report to your doctor   or health care professional if they continue or are bothersome): -bone pain -muscle pain This list may not describe all possible side effects. Call your doctor for medical advice about side effects. You may report side effects to FDA at  1-800-FDA-1088. Where should I keep my medicine? Keep out of the reach of children. Store pre-filled syringes in a refrigerator between 2 and 8 degrees C (36 and 46 degrees F). Do not freeze. Keep in carton to protect from light. Throw away this medicine if it is left out of the refrigerator for more than 48 hours. Throw away any unused medicine after the expiration date. NOTE: This sheet is a summary. It may not cover all possible information. If you have questions about this medicine, talk to your doctor, pharmacist, or health care provider.  2015, [employer]. (2014-01-02 16:14:05)  

## 2014-09-06 ENCOUNTER — Inpatient Hospital Stay (HOSPITAL_COMMUNITY)
Admission: EM | Admit: 2014-09-06 | Discharge: 2014-09-11 | DRG: 194 | Disposition: A | Discharge status: Home / Self Care | Payer: Medicare Other | Attending: Family Medicine | Admitting: Family Medicine

## 2014-09-06 ENCOUNTER — Other Ambulatory Visit: Payer: Self-pay

## 2014-09-06 ENCOUNTER — Encounter (HOSPITAL_COMMUNITY): Payer: Self-pay

## 2014-09-06 ENCOUNTER — Other Ambulatory Visit: Payer: Self-pay | Admitting: Oncology

## 2014-09-06 ENCOUNTER — Emergency Department (HOSPITAL_COMMUNITY): Payer: Medicare Other

## 2014-09-06 DIAGNOSIS — R911 Solitary pulmonary nodule: Secondary | ICD-10-CM | POA: Diagnosis present

## 2014-09-06 DIAGNOSIS — D701 Agranulocytosis secondary to cancer chemotherapy: Secondary | ICD-10-CM | POA: Diagnosis present

## 2014-09-06 DIAGNOSIS — J189 Pneumonia, unspecified organism: Principal | ICD-10-CM

## 2014-09-06 DIAGNOSIS — K2270 Barrett's esophagus without dysplasia: Secondary | ICD-10-CM | POA: Diagnosis present

## 2014-09-06 DIAGNOSIS — Y95 Nosocomial condition: Secondary | ICD-10-CM | POA: Diagnosis present

## 2014-09-06 DIAGNOSIS — C50211 Malignant neoplasm of upper-inner quadrant of right female breast: Secondary | ICD-10-CM

## 2014-09-06 DIAGNOSIS — R7989 Other specified abnormal findings of blood chemistry: Secondary | ICD-10-CM

## 2014-09-06 DIAGNOSIS — N179 Acute kidney failure, unspecified: Secondary | ICD-10-CM | POA: Diagnosis present

## 2014-09-06 DIAGNOSIS — E785 Hyperlipidemia, unspecified: Secondary | ICD-10-CM | POA: Diagnosis present

## 2014-09-06 DIAGNOSIS — D696 Thrombocytopenia, unspecified: Secondary | ICD-10-CM

## 2014-09-06 DIAGNOSIS — M797 Fibromyalgia: Secondary | ICD-10-CM | POA: Diagnosis present

## 2014-09-06 DIAGNOSIS — D6959 Other secondary thrombocytopenia: Secondary | ICD-10-CM | POA: Diagnosis present

## 2014-09-06 DIAGNOSIS — K219 Gastro-esophageal reflux disease without esophagitis: Secondary | ICD-10-CM | POA: Diagnosis present

## 2014-09-06 DIAGNOSIS — IMO0002 Reserved for concepts with insufficient information to code with codable children: Secondary | ICD-10-CM

## 2014-09-06 DIAGNOSIS — R110 Nausea: Secondary | ICD-10-CM

## 2014-09-06 DIAGNOSIS — R509 Fever, unspecified: Secondary | ICD-10-CM | POA: Diagnosis not present

## 2014-09-06 DIAGNOSIS — T451X5A Adverse effect of antineoplastic and immunosuppressive drugs, initial encounter: Secondary | ICD-10-CM | POA: Diagnosis present

## 2014-09-06 DIAGNOSIS — D72829 Elevated white blood cell count, unspecified: Secondary | ICD-10-CM

## 2014-09-06 DIAGNOSIS — C50219 Malignant neoplasm of upper-inner quadrant of unspecified female breast: Secondary | ICD-10-CM | POA: Diagnosis present

## 2014-09-06 DIAGNOSIS — F329 Major depressive disorder, single episode, unspecified: Secondary | ICD-10-CM | POA: Diagnosis present

## 2014-09-06 DIAGNOSIS — F419 Anxiety disorder, unspecified: Secondary | ICD-10-CM | POA: Diagnosis present

## 2014-09-06 DIAGNOSIS — Z79899 Other long term (current) drug therapy: Secondary | ICD-10-CM

## 2014-09-06 LAB — CBC WITH DIFFERENTIAL/PLATELET
BASOS ABS: 0 10*3/uL (ref 0.0–0.1)
Basophils Relative: 0 % (ref 0–1)
Eosinophils Absolute: 0 10*3/uL (ref 0.0–0.7)
Eosinophils Relative: 0 % (ref 0–5)
HCT: 34.9 % — ABNORMAL LOW (ref 36.0–46.0)
Hemoglobin: 11.6 g/dL — ABNORMAL LOW (ref 12.0–15.0)
LYMPHS PCT: 3 % — AB (ref 12–46)
Lymphs Abs: 0.6 10*3/uL — ABNORMAL LOW (ref 0.7–4.0)
MCH: 31.7 pg (ref 26.0–34.0)
MCHC: 33.2 g/dL (ref 30.0–36.0)
MCV: 95.4 fL (ref 78.0–100.0)
Monocytes Absolute: 0.2 10*3/uL (ref 0.1–1.0)
Monocytes Relative: 1 % — ABNORMAL LOW (ref 3–12)
NEUTROS ABS: 17.5 10*3/uL — AB (ref 1.7–7.7)
NEUTROS PCT: 96 % — AB (ref 43–77)
PLATELETS: 149 10*3/uL — AB (ref 150–400)
RBC: 3.66 MIL/uL — ABNORMAL LOW (ref 3.87–5.11)
RDW: 13.1 % (ref 11.5–15.5)
WBC: 18.3 10*3/uL — AB (ref 4.0–10.5)

## 2014-09-06 LAB — URINALYSIS, ROUTINE W REFLEX MICROSCOPIC
BILIRUBIN URINE: NEGATIVE
GLUCOSE, UA: NEGATIVE mg/dL
KETONES UR: NEGATIVE mg/dL
Nitrite: NEGATIVE
PH: 6.5 (ref 5.0–8.0)
Protein, ur: NEGATIVE mg/dL
Specific Gravity, Urine: 1.012 (ref 1.005–1.030)
Urobilinogen, UA: 0.2 mg/dL (ref 0.0–1.0)

## 2014-09-06 LAB — COMPREHENSIVE METABOLIC PANEL
ALK PHOS: 54 U/L (ref 39–117)
ALT: 40 U/L — ABNORMAL HIGH (ref 0–35)
AST: 42 U/L — AB (ref 0–37)
Albumin: 3.2 g/dL — ABNORMAL LOW (ref 3.5–5.2)
Anion gap: 13 (ref 5–15)
BILIRUBIN TOTAL: 0.5 mg/dL (ref 0.3–1.2)
BUN: 22 mg/dL (ref 6–23)
CHLORIDE: 103 meq/L (ref 96–112)
CO2: 22 meq/L (ref 19–32)
Calcium: 8.8 mg/dL (ref 8.4–10.5)
Creatinine, Ser: 1.21 mg/dL — ABNORMAL HIGH (ref 0.50–1.10)
GFR calc Af Amer: 49 mL/min — ABNORMAL LOW (ref >90)
GFR, EST NON AFRICAN AMERICAN: 43 mL/min — AB (ref >90)
Glucose, Bld: 107 mg/dL — ABNORMAL HIGH (ref 70–99)
POTASSIUM: 3.8 meq/L (ref 3.7–5.3)
SODIUM: 138 meq/L (ref 137–147)
Total Protein: 6.1 g/dL (ref 6.0–8.3)

## 2014-09-06 LAB — I-STAT CG4 LACTIC ACID, ED
Lactic Acid, Venous: 0.77 mmol/L (ref 0.5–2.2)
Lactic Acid, Venous: 0.45 mmol/L — ABNORMAL LOW (ref 0.5–2.2)

## 2014-09-06 LAB — D-DIMER, QUANTITATIVE (NOT AT ARMC): D-Dimer, Quant: 2.3 ug/mL-FEU — ABNORMAL HIGH (ref 0.00–0.48)

## 2014-09-06 LAB — URINE MICROSCOPIC-ADD ON

## 2014-09-06 MED ORDER — MENTHOL 3 MG MT LOZG
1.0000 | LOZENGE | OROMUCOSAL | Status: DC | PRN
  Filled 2014-09-06: qty 9

## 2014-09-06 MED ORDER — OXYCODONE HCL 5 MG PO TABS
5.0000 mg | ORAL_TABLET | ORAL | Status: DC | PRN
  Administered 2014-09-06 – 2014-09-07 (×2): 5 mg via ORAL
  Filled 2014-09-06 (×2): qty 1

## 2014-09-06 MED ORDER — ENOXAPARIN SODIUM 80 MG/0.8ML ~~LOC~~ SOLN
1.0000 mg/kg | Freq: Two times a day (BID) | SUBCUTANEOUS | Status: DC
Start: 2014-09-06 — End: 2014-09-06

## 2014-09-06 MED ORDER — ALUM & MAG HYDROXIDE-SIMETH 200-200-20 MG/5ML PO SUSP
30.0000 mL | Freq: Four times a day (QID) | ORAL | Status: DC | PRN
  Administered 2014-09-07 – 2014-09-10 (×4): 30 mL via ORAL
  Filled 2014-09-06 (×3): qty 30

## 2014-09-06 MED ORDER — ACETAMINOPHEN 325 MG PO TABS
650.0000 mg | ORAL_TABLET | Freq: Once | ORAL | Status: AC
  Administered 2014-09-06: 650 mg via ORAL
  Filled 2014-09-06: qty 2

## 2014-09-06 MED ORDER — IOHEXOL 350 MG/ML SOLN
100.0000 mL | Freq: Once | INTRAVENOUS | Status: AC | PRN
  Administered 2014-09-06: 100 mL via INTRAVENOUS

## 2014-09-06 MED ORDER — DEXTROSE 5 % IV SOLN
1.0000 g | INTRAVENOUS | Status: DC
  Administered 2014-09-07: 22:00:00 via INTRAVENOUS
  Filled 2014-09-06: qty 1

## 2014-09-06 MED ORDER — ACETAMINOPHEN 650 MG RE SUPP: 650.0000 mg | Freq: Four times a day (QID) | RECTAL | Status: DC | PRN

## 2014-09-06 MED ORDER — ONDANSETRON HCL 4 MG/2ML IJ SOLN
4.0000 mg | Freq: Four times a day (QID) | INTRAMUSCULAR | Status: DC | PRN
  Administered 2014-09-09: 4 mg via INTRAVENOUS
  Filled 2014-09-06: qty 2

## 2014-09-06 MED ORDER — SODIUM CHLORIDE 0.9 % IV BOLUS (SEPSIS)
1000.0000 mL | Freq: Once | INTRAVENOUS | Status: AC
  Administered 2014-09-06: 1000 mL via INTRAVENOUS

## 2014-09-06 MED ORDER — DEXAMETHASONE 4 MG PO TABS
4.0000 mg | ORAL_TABLET | Freq: Two times a day (BID) | ORAL | Status: AC
  Administered 2014-09-06 – 2014-09-07 (×3): 4 mg via ORAL
  Filled 2014-09-06 (×3): qty 1

## 2014-09-06 MED ORDER — VANCOMYCIN HCL IN DEXTROSE 1-5 GM/200ML-% IV SOLN
1000.0000 mg | Freq: Once | INTRAVENOUS | Status: AC
  Administered 2014-09-06: 1000 mg via INTRAVENOUS
  Filled 2014-09-06: qty 200

## 2014-09-06 MED ORDER — CYANOCOBALAMIN 1000 MCG/ML IJ SOLN: 1000.0000 ug | INTRAMUSCULAR | Status: DC

## 2014-09-06 MED ORDER — ONDANSETRON HCL 4 MG PO TABS
4.0000 mg | ORAL_TABLET | Freq: Four times a day (QID) | ORAL | Status: DC | PRN
  Administered 2014-09-07 – 2014-09-10 (×4): 4 mg via ORAL
  Filled 2014-09-06 (×4): qty 1

## 2014-09-06 MED ORDER — ADULT MULTIVITAMIN W/MINERALS CH
1.0000 | ORAL_TABLET | Freq: Every day | ORAL | Status: DC
  Administered 2014-09-07 – 2014-09-11 (×5): 1 via ORAL
  Filled 2014-09-06 (×5): qty 1

## 2014-09-06 MED ORDER — ENOXAPARIN SODIUM 40 MG/0.4ML ~~LOC~~ SOLN
40.0000 mg | Freq: Every day | SUBCUTANEOUS | Status: DC
  Administered 2014-09-06 – 2014-09-08 (×3): 40 mg via SUBCUTANEOUS
  Filled 2014-09-06 (×4): qty 0.4

## 2014-09-06 MED ORDER — DEXTROSE 5 % IV SOLN
2.0000 g | INTRAVENOUS | Status: AC
  Administered 2014-09-06: 2 g via INTRAVENOUS
  Filled 2014-09-06: qty 2

## 2014-09-06 MED ORDER — HYDROMORPHONE HCL 1 MG/ML IJ SOLN: 0.5000 mg | INTRAMUSCULAR | Status: DC | PRN

## 2014-09-06 MED ORDER — TEMAZEPAM 15 MG PO CAPS
30.0000 mg | ORAL_CAPSULE | Freq: Every evening | ORAL | Status: DC | PRN
  Administered 2014-09-06 – 2014-09-10 (×5): 30 mg via ORAL
  Filled 2014-09-06 (×5): qty 2

## 2014-09-06 MED ORDER — VANCOMYCIN HCL IN DEXTROSE 1-5 GM/200ML-% IV SOLN
1000.0000 mg | INTRAVENOUS | Status: DC
  Administered 2014-09-07 – 2014-09-09 (×3): 1000 mg via INTRAVENOUS
  Filled 2014-09-06 (×4): qty 200

## 2014-09-06 MED ORDER — SODIUM CHLORIDE 0.9 % IV SOLN
INTRAVENOUS | Status: DC
  Administered 2014-09-06: via INTRAVENOUS

## 2014-09-06 MED ORDER — ACETAMINOPHEN 325 MG PO TABS
650.0000 mg | ORAL_TABLET | Freq: Four times a day (QID) | ORAL | Status: DC | PRN
  Administered 2014-09-09 – 2014-09-11 (×3): 650 mg via ORAL
  Filled 2014-09-06 (×3): qty 2

## 2014-09-06 MED ORDER — LORAZEPAM 0.5 MG PO TABS
0.5000 mg | ORAL_TABLET | Freq: Four times a day (QID) | ORAL | Status: DC | PRN
  Administered 2014-09-06 – 2014-09-11 (×6): 0.5 mg via ORAL
  Filled 2014-09-06 (×6): qty 1

## 2014-09-06 NOTE — ED Notes (Addendum)
Pt from home via EMS- Per EMS, pt and family c/o fever and weakness. Pt started chemo for breast CA x2 days ago. Pt family reports fever of 102.5. Pt received 1 norco and 200mg  ibuprofen PTA from family. Pt denies pain or other c/o.  Pt is A&O and in NAD

## 2014-09-06 NOTE — ED Provider Notes (Signed)
CSN: [id number]     Arrival date & time 09/06/14  1623 History   First MD Initiated Contact with Patient 09/06/14 1723     Chief Complaint  Patient presents with  . Weakness  . Fever     (Consider location/radiation/quality/duration/timing/severity/associated sxs/prior Treatment) HPI Comments: Patient is a 73 year old female with a known history of breast cancer, recently started chemotherapy, presents to the emergency department chief complaint of fever, generalized fatigue since last night. Patient's family reports MAXIMUM TEMPERATURE of 102 orally, last night. Reports 400 mg ibuprofen 400mg  at approximately 090 today, hydrocodone 5/325 at 1530. Patient reports constipation and bilateral hip pain. Denies cough, dysuria, any hematuria. Patient reports having to take deep breaths recently, denies shortness of breath, is not on home oxygen. Oncologist [place].  The history is provided by the patient and a relative. No language interpreter was used.    Past Medical History  Diagnosis Date  . Depression   . Anxiety   . GERD (gastroesophageal reflux disease)   . Anemia   . Dyslipidemia   . DDD (degenerative disc disease), lumbar   . Osteopenia   . OA (ocular albinism)   . HSV infection   . Vitamin D deficiency   . Whooping cough   . Barrett's esophagus   . Breast cancer of upper-inner quadrant of right female breast 08/08/2014  . Fibromyalgia    Past Surgical History  Procedure Laterality Date  . Appendectomy    . Total abdominal hysterectomy    . Bilateral salpingoophorectomy    . Portacath placement Left 08/26/2014    Procedure: INSERTION PORT-A-CATH;  Surgeon: Alphonsa Overall, MD;  Location: [facility];  Service: General;  Laterality: Left;   Family History  Problem Relation Age of Onset  . Diabetes Father   . Stroke Mother   . Kidney disease Mother     lost rt. kidney due to stone  . Gastric cancer Brother   . Dementia Brother   . Cancer      groin area  . Colon cancer  Sister 35   History  Substance Use Topics  . Smoking status: Never Smoker   . Smokeless tobacco: Never Used  . Alcohol Use: 1.2 oz/week    2 Glasses of wine per week     Comment: rarely   OB History    No data available     Review of Systems  Constitutional: Positive for fever and fatigue. Negative for chills.  Respiratory: Negative for cough.   Cardiovascular: Negative for chest pain.      Allergies  Review of patient's allergies indicates no known allergies.  Home Medications   Prior to Admission medications   Medication Sig Start Date End Date Taking? Authorizing Provider  cyanocobalamin (,VITAMIN B-12,) 1000 MCG/ML injection Inject 1,000 mcg into the muscle every 30 (thirty) days.    Historical Provider, MD  dexamethasone (DECADRON) 4 MG tablet Take 1 tablet (4 mg total) by mouth 2 (two) times daily. Start the day before Taxotere. Then again the day after chemo for 3 days. 08/28/14   Rulon Eisenmenger, MD  DULoxetine (CYMBALTA) 30 MG capsule Take 30 mg by mouth daily.    Historical Provider, MD  furosemide (LASIX) 20 MG tablet Take 20 mg by mouth daily.    Historical Provider, MD  HYDROcodone-acetaminophen (NORCO/VICODIN) 5-325 MG per tablet Take 1-2 tablets by mouth every 6 (six) hours as needed. 08/26/14   Alphonsa Overall, MD  lidocaine-prilocaine (EMLA) cream Apply 1 application topically as needed.  08/26/14   Alphonsa Overall, MD  LORazepam (ATIVAN) 0.5 MG tablet Take 1 tablet (0.5 mg total) by mouth every 6 (six) hours as needed (Nausea or vomiting). 08/28/14   Rulon Eisenmenger, MD  Multiple Vitamin (MULTIVITAMIN WITH MINERALS) TABS tablet Take 1 tablet by mouth daily.    Historical Provider, MD  ondansetron (ZOFRAN) 8 MG tablet Take 1 tablet (8 mg total) by mouth 2 (two) times daily. Start the day after chemo for 3 days. Then take as needed for nausea or vomiting. 08/28/14   Rulon Eisenmenger, MD  pravastatin (PRAVACHOL) 20 MG tablet  06/04/13   Historical Provider, MD  pregabalin  (LYRICA) 25 MG capsule Take 25 mg by mouth 2 (two) times daily as needed (for pain).     Historical Provider, MD  prochlorperazine (COMPAZINE) 10 MG tablet Take 1 tablet (10 mg total) by mouth every 6 (six) hours as needed (Nausea or vomiting). 08/28/14   Rulon Eisenmenger, MD  temazepam (RESTORIL) 30 MG capsule Take 30 mg by mouth at bedtime as needed for sleep.    Historical Provider, MD  Vitamin D, Ergocalciferol, (DRISDOL) 50000 UNITS CAPS Take 50,000 Units by mouth every 7 (seven) days. Takes on Thursday or Friday    Historical Provider, MD   SpO2 98% Physical Exam  Constitutional: She is oriented to person, place, and time. She appears well-developed and well-nourished. No distress.  HENT:  Head: Normocephalic and atraumatic.  Neck: Neck supple.  Cardiovascular: Normal rate.   Pulmonary/Chest: Effort normal and breath sounds normal. She has no wheezes. She has no rales.  Left upper chest wall: port placement, minimally tender, no erythema. Healing ecchymosis.  Abdominal: Soft. There is no tenderness. There is no rebound and no guarding.  Musculoskeletal: Normal range of motion.  Neurological: She is alert and oriented to person, place, and time.  Skin: Skin is warm and dry. She is not diaphoretic.  Psychiatric: She has a normal mood and affect. Her behavior is normal.  Nursing note and vitals reviewed.   ED Course  Procedures (including critical care time) Labs Review Labs Reviewed  CBC WITH DIFFERENTIAL - Abnormal; Notable for the following:    WBC 18.3 (*)    RBC 3.66 (*)    Hemoglobin 11.6 (*)    HCT 34.9 (*)    Platelets 149 (*)    Neutrophils Relative % 96 (*)    Neutro Abs 17.5 (*)    Lymphocytes Relative 3 (*)    Lymphs Abs 0.6 (*)    Monocytes Relative 1 (*)    All other components within normal limits  COMPREHENSIVE METABOLIC PANEL - Abnormal; Notable for the following:    Glucose, Bld 107 (*)    Creatinine, Ser 1.21 (*)    Albumin 3.2 (*)    AST 42 (*)    ALT  40 (*)    GFR calc non Af Amer 43 (*)    GFR calc Af Amer 49 (*)    All other components within normal limits  URINALYSIS, ROUTINE W REFLEX MICROSCOPIC - Abnormal; Notable for the following:    Hgb urine dipstick TRACE (*)    Leukocytes, UA TRACE (*)    All other components within normal limits  D-DIMER, QUANTITATIVE - Abnormal; Notable for the following:    D-Dimer, Quant 2.30 (*)    All other components within normal limits  I-STAT CG4 LACTIC ACID, ED - Abnormal; Notable for the following:    Lactic Acid, Venous 0.45 (*)  All other components within normal limits  CULTURE, BLOOD (ROUTINE X 2)  CULTURE, BLOOD (ROUTINE X 2)  URINE CULTURE  URINE MICROSCOPIC-ADD ON  I-STAT CG4 LACTIC ACID, ED    Imaging Review Dg Chest 2 View  09/06/2014   CLINICAL DATA:  Short of breath and weakness.  EXAM: CHEST  2 VIEW  COMPARISON:  08/26/2014  FINDINGS: Left-sided power port unchanged. Normal cardiac silhouette. Chronic bronchitic markings noted. No effusion, infiltrate, pneumothorax.  IMPRESSION: 1. No acute cardiopulmonary process. 2. Chronic bronchitic markings.   Electronically Signed   By: Suzy Bouchard M.D.   On: 09/06/2014 17:43   Ct Angio Chest Pe W/cm &/or Wo Cm  09/06/2014   CLINICAL DATA:  Evaluate for PE, fever and weakness  EXAM: CT ANGIOGRAPHY CHEST WITH CONTRAST  TECHNIQUE: Multidetector CT imaging of the chest was performed using the standard protocol during bolus administration of intravenous contrast. Multiplanar CT image reconstructions and MIPs were obtained to evaluate the vascular anatomy.  CONTRAST:  127mL OMNIPAQUE IOHEXOL 350 MG/ML SOLN  COMPARISON:  Chest radiograph 09/06/2014  FINDINGS: There are no filling defects within pulmonary arteries to suggest acute pulmonary embolism. No acute findings aorta great vessels. No pericardial fluid. Esophagus is normal.  Review of the lung parenchyma demonstrates a lobular septal thickening. There is a sub solid nodule in the right  lower lobe measuring 4 mm (image 60, series 7).  No axillary or supraclavicular adenopathy. No mediastinal adenopathy.  Limited upper abdomen is unremarkable. No aggressive osseous lesion.  Review of the MIP images confirms the above findings.  IMPRESSION: 1. No evidence of acute pulmonary embolism. 2. Mild interlobular septal thickening suggests mild interstitial edema. 3. Small sub solid solid nodule in the right lower lobe likely represent small focus of infection. Recommend follow-up CT without contrast in 3 months to determine if this lesion persists.   Electronically Signed   By: Suzy Bouchard M.D.   On: 09/06/2014 20:09     EKG Interpretation None      MDM   Final diagnoses:  Fever  Elevated d-dimer  HCAP (healthcare-associated pneumonia)   Patient with recent surgery 11/10 for breast mass removal, recently started chemotherapy presents with fever, no unknown source. Patient not on oxygen at home, SPO2 92 and 95% on room air, patient denying shortness of breath but reports having to take "deep breaths", x-ray negative for acute findings, d-dimer is positive 2.3, CT of chest showed small nodule likely early infiltrate, no PE. CBC shows leukocytosis at 18.3. hemoglobin 11.6, stable from 3 weeks ago. UA negative for infection. Dr. Jeneen Rinks also evaluated the pateint. Plan to admit for healthcare associated pneumonia, acute kidney injury, fever. Discussed with Dr. Arnoldo Morale who agrees to admit the patient.  Meds given in ED:  Medications  vancomycin (VANCOCIN) IVPB 1000 mg/200 mL premix (not administered)  ceFEPIme (MAXIPIME) 2 g in dextrose 5 % 50 mL IVPB (not administered)  ceFEPIme (MAXIPIME) 1 g in dextrose 5 % 50 mL IVPB (not administered)  sodium chloride 0.9 % bolus 1,000 mL (1,000 mLs Intravenous New Bag/Given 09/06/14 1832)  acetaminophen (TYLENOL) tablet 650 mg (650 mg Oral Given 09/06/14 1832)  iohexol (OMNIPAQUE) 350 MG/ML injection 100 mL (100 mLs Intravenous Contrast Given  09/06/14 1953)    New Prescriptions   No medications on file      Harvie Heck, PA-C 09/06/14 [place], MD 09/11/14 1742

## 2014-09-06 NOTE — ED Notes (Signed)
Patient transported to CT 

## 2014-09-06 NOTE — Progress Notes (Addendum)
ANTIBIOTIC CONSULT NOTE - INITIAL  Pharmacy Consult for Cefepime & Vancomycin Indication: pneumonia  No Known Allergies  Patient Measurements: Height: 5\' 1"  (154.9 cm) Weight: 171 lb (77.565 kg) IBW/kg (Calculated) : 47.8  Vital Signs: Temp: 98.8 F (37.1 C) (11/21 2003) Temp Source: Oral (11/21 2003) BP: 110/43 mmHg (11/21 2003) Pulse Rate: 80 (11/21 2003) Intake/Output from previous day:   Intake/Output from this shift:    Labs:  Recent Labs  09/06/14 1749  WBC 18.3*  HGB 11.6*  PLT 149*  CREATININE 1.21*   Estimated Creatinine Clearance: 37.9 mL/min (by C-G formula based on Cr of 1.21). No results for input(s): VANCOTROUGH, VANCOPEAK, VANCORANDOM, GENTTROUGH, GENTPEAK, GENTRANDOM, TOBRATROUGH, TOBRAPEAK, TOBRARND, AMIKACINPEAK, AMIKACINTROU, AMIKACIN in the last 72 hours.   Microbiology: No results found for this or any previous visit (from the past 720 hour(s)).  Medical History: Past Medical History  Diagnosis Date  . Depression   . Anxiety   . GERD (gastroesophageal reflux disease)   . Anemia   . Dyslipidemia   . DDD (degenerative disc disease), lumbar   . Osteopenia   . OA (ocular albinism)   . HSV infection   . Vitamin D deficiency   . Whooping cough   . Barrett's esophagus   . Breast cancer of upper-inner quadrant of right female breast 08/08/2014  . Fibromyalgia     Medications:  Scheduled:   Infusions:  . [START ON 09/07/2014] ceFEPime (MAXIPIME) IV    . ceFEPime (MAXIPIME) IV    . vancomycin     Assessment:  73 yr female with breast cancer, undergoing chemotherapy presents with fever and weakness.  Home temp reported at [place].  Chest CT shows likely early infiltrate  Blood and urine cultures ordered  Vancomycin 1gm x 1 ordered in ED  Pharmacy consulted to dose Cefepime & Vanc for HCAP  CrCl ~ 37 ml/min  Goal of Therapy:  Eradication of infection Vancomycin Trough 15-20 mcg/ml  Plan:  Measure antibiotic drug levels at  steady state Follow up culture results  Cefepime 2gm IV x 1 then Cefepime 1gm IV q24h Vancomycin 1gm IV q24h  Layaan Mott, Toribio Harbour, PharmD 09/06/2014,9:24 PM

## 2014-09-06 NOTE — ED Notes (Signed)
Bed: OI51 Expected date: 09/06/14 Expected time: 4:22 PM Means of arrival: Ambulance Comments: Ca pt, weakness, fever

## 2014-09-06 NOTE — H&P (Signed)
[facility] Admission History and Physical       Kelli Owens QMV:[id number] DOB: 01/03/1941 DOA: 09/06/2014  Referring physician:  PCP: Jerlyn Ly, MD  Specialists:   Chief Complaint: Fevers and Chills  HPI: Kelli Owens is a 73 y.o. female with a history of Adenocarcinoma of the Right Breast diagnosed 07/2014 who had her first chemotherapy rx 2 days ago and presents to the ED with complaints of Fevers and Chills and nonproductive cough x 1 day.   She reports having increased weakness and malaise.    She was found on a CTS of the Chest to have a RLL pneumonia, and a solid lung nodule ans well as interstitial edema.  She was placed on IV Vancomycn and Cefepime and referred for medical admission.       Review of Systems:  Constitutional: No Weight Loss, No Weight Gain, Night Sweats, +Fevers, +Chills, Dizziness, Fatigue, +Generalized Weakness HEENT: No Headaches, Difficulty Swallowing,Tooth/Dental Problems,Sore Throat,  No Sneezing, Rhinitis, Ear Ache, Nasal Congestion, or Post Nasal Drip,  Cardio-vascular:  No Chest pain, Orthopnea, PND, Edema in Lower Extremities, Anasarca, Dizziness, Palpitations  Resp: No Dyspnea, No DOE, +Non-Productive Cough, No Hemoptysis, No Wheezing.    GI: No Heartburn, Indigestion, Abdominal Pain, Nausea, Vomiting, Diarrhea, Hematemesis, Hematochezia, Melena, Change in Bowel Habits,  Loss of Appetite  GU: No Dysuria, Change in Color of Urine, No Urgency or Frequency, No Flank pain.  Musculoskeletal: No Joint Pain or Swelling, No Decreased Range of Motion, No Back Pain.  Neurologic: No Syncope, No Seizures, Muscle Weakness, Paresthesia, Vision Disturbance or Loss, No Diplopia, No Vertigo, No Difficulty Walking,  Skin: No Rash or Lesions. Psych: No Change in Mood or Affect, No Depression or Anxiety, No Memory loss, No Confusion, or Hallucinations   Past Medical History  Diagnosis Date  . Depression   . Anxiety   . GERD  (gastroesophageal reflux disease)   . Anemia   . Dyslipidemia   . DDD (degenerative disc disease), lumbar   . Osteopenia   . OA (ocular albinism)   . HSV infection   . Vitamin D deficiency   . Whooping cough   . Barrett's esophagus   . Breast cancer of upper-inner quadrant of right female breast 08/08/2014  . Fibromyalgia       Past Surgical History  Procedure Laterality Date  . Appendectomy    . Total abdominal hysterectomy    . Bilateral salpingoophorectomy    . Portacath placement Left 08/26/2014    Procedure: INSERTION PORT-A-CATH;  Surgeon: Alphonsa Overall, MD;  Location: [facility];  Service: General;  Laterality: Left;       Prior to Admission medications   Medication Sig Start Date End Date Taking? Authorizing Provider  cyanocobalamin (,VITAMIN B-12,) 1000 MCG/ML injection Inject 1,000 mcg into the muscle every 30 (thirty) days.   Yes Historical Provider, MD  dexamethasone (DECADRON) 4 MG tablet Take 1 tablet (4 mg total) by mouth 2 (two) times daily. Start the day before Taxotere. Then again the day after chemo for 3 days. 08/28/14  Yes Rulon Eisenmenger, MD  HYDROcodone-acetaminophen (NORCO/VICODIN) 5-325 MG per tablet Take 1-2 tablets by mouth every 6 (six) hours as needed. 08/26/14  Yes Alphonsa Overall, MD  lidocaine-prilocaine (EMLA) cream Apply 1 application topically as needed. 08/26/14  Yes Alphonsa Overall, MD  LORazepam (ATIVAN) 0.5 MG tablet Take 0.5 mg by mouth every 6 (six) hours as needed for anxiety.   Yes Historical Provider, MD  LYRICA 75 MG capsule  Take 75 mg by mouth 3 (three) times daily as needed (for pain).  09/03/14  Yes Historical Provider, MD  Multiple Vitamin (MULTIVITAMIN WITH MINERALS) TABS tablet Take 1 tablet by mouth daily.   Yes Historical Provider, MD  ondansetron (ZOFRAN) 8 MG tablet Take 1 tablet (8 mg total) by mouth 2 (two) times daily. Start the day after chemo for 3 days. Then take as needed for nausea or vomiting. 08/28/14  Yes Rulon Eisenmenger, MD    temazepam (RESTORIL) 30 MG capsule Take 30 mg by mouth at bedtime as needed for sleep.   Yes Historical Provider, MD  Vitamin D, Ergocalciferol, (DRISDOL) 50000 UNITS CAPS Take 50,000 Units by mouth every 7 (seven) days. Takes on Thursday or Friday   Yes Historical Provider, MD      No Known Allergies   Social History:  reports that she has never smoked. She has never used smokeless tobacco. She reports that she drinks about 1.2 oz of alcohol per week. She reports that she does not use illicit drugs.     Family History  Problem Relation Age of Onset  . Diabetes Father   . Stroke Mother   . Kidney disease Mother     lost rt. kidney due to stone  . Gastric cancer Brother   . Dementia Brother   . Cancer      groin area  . Colon cancer Sister 93       Physical Exam:  GEN:  Pleasant Elderly Obese 73 y.o. Caucasian female examined and in no acute distress; cooperative with exam Filed Vitals:   09/06/14 1650 09/06/14 1747 09/06/14 1800 09/06/14 2003  BP:    110/43  Pulse:    80  Temp:  100 F (37.8 C)  98.8 F (37.1 C)  TempSrc:  Rectal  Oral  Resp:    17  Height:   5\' 1"  (1.549 m)   Weight:   77.565 kg (171 lb)   SpO2: 98%   100%   Blood pressure 110/43, pulse 80, temperature 98.8 F (37.1 C), temperature source Oral, resp. rate 17, height 5\' 1"  (1.549 m), weight 77.565 kg (171 lb), SpO2 100 %. PSYCH: She is alert and oriented x4; does not appear anxious does not appear depressed; affect is normal HEENT: Normocephalic and Atraumatic, Mucous membranes pink; PERRLA; EOM intact; Fundi:  Benign;  No scleral icterus, Nares: Patent, Oropharynx: Clear, Fair Dentition,    Neck:  FROM, No Cervical Lymphadenopathy nor Thyromegaly or Carotid Bruit; No JVD; Breasts:: Not examined CHEST WALL: No tenderness CHEST: Normal respiration, clear to auscultation bilaterally HEART: Regular rate and rhythm; no murmurs rubs or gallops BACK: No kyphosis or scoliosis; No CVA tenderness ABDOMEN:  Positive Bowel Sounds, Soft Non-Tender; No Masses, No Organomegaly Rectal Exam: Not done EXTREMITIES: No Cyanosis, Clubbing, or Edema; No Ulcerations. Genitalia: not examined PULSES: 2+ and symmetric SKIN: Normal hydration no rash or ulceration CNS:  Alert and Oriented x 4, No Focal Deficits  Vascular: pulses palpable throughout    Labs on Admission:  Basic Metabolic Panel:  Recent Labs Lab 09/06/14 1749  NA 138  K 3.8  CL 103  CO2 22  GLUCOSE 107*  BUN 22  CREATININE 1.21*  CALCIUM 8.8   Liver Function Tests:  Recent Labs Lab 09/06/14 1749  AST 42*  ALT 40*  ALKPHOS 54  BILITOT 0.5  PROT 6.1  ALBUMIN 3.2*   No results for input(s): LIPASE, AMYLASE in the last 168 hours. No results for  input(s): AMMONIA in the last 168 hours. CBC:  Recent Labs Lab 09/06/14 1749  WBC 18.3*  NEUTROABS 17.5*  HGB 11.6*  HCT 34.9*  MCV 95.4  PLT 149*   Cardiac Enzymes: No results for input(s): CKTOTAL, CKMB, CKMBINDEX, TROPONINI in the last 168 hours.  BNP (last 3 results) No results for input(s): PROBNP in the last 8760 hours. CBG: No results for input(s): GLUCAP in the last 168 hours.  Radiological Exams on Admission: Dg Chest 2 View  09/06/2014   CLINICAL DATA:  Short of breath and weakness.  EXAM: CHEST  2 VIEW  COMPARISON:  08/26/2014  FINDINGS: Left-sided power port unchanged. Normal cardiac silhouette. Chronic bronchitic markings noted. No effusion, infiltrate, pneumothorax.  IMPRESSION: 1. No acute cardiopulmonary process. 2. Chronic bronchitic markings.   Electronically Signed   By: Suzy Bouchard M.D.   On: 09/06/2014 17:43   Ct Angio Chest Pe W/cm &/or Wo Cm  09/06/2014   CLINICAL DATA:  Evaluate for PE, fever and weakness  EXAM: CT ANGIOGRAPHY CHEST WITH CONTRAST  TECHNIQUE: Multidetector CT imaging of the chest was performed using the standard protocol during bolus administration of intravenous contrast. Multiplanar CT image reconstructions and MIPs were  obtained to evaluate the vascular anatomy.  CONTRAST:  146mL OMNIPAQUE IOHEXOL 350 MG/ML SOLN  COMPARISON:  Chest radiograph 09/06/2014  FINDINGS: There are no filling defects within pulmonary arteries to suggest acute pulmonary embolism. No acute findings aorta great vessels. No pericardial fluid. Esophagus is normal.  Review of the lung parenchyma demonstrates a lobular septal thickening. There is a sub solid nodule in the right lower lobe measuring 4 mm (image 60, series 7).  No axillary or supraclavicular adenopathy. No mediastinal adenopathy.  Limited upper abdomen is unremarkable. No aggressive osseous lesion.  Review of the MIP images confirms the above findings.  IMPRESSION: 1. No evidence of acute pulmonary embolism. 2. Mild interlobular septal thickening suggests mild interstitial edema. 3. Small sub solid solid nodule in the right lower lobe likely represent small focus of infection. Recommend follow-up CT without contrast in 3 months to determine if this lesion persists.   Electronically Signed   By: Suzy Bouchard M.D.   On: 09/06/2014 20:09       Assessment/Plan:   73 y.o. female with   Principal Problem:   1.   HCAP (healthcare-associated pneumonia)   IV Vancomycin and Cefepime   Albuterol Nebs   Active Problems:   2.   Breast cancer of upper-inner quadrant of right female breast   Had First Chemotherapy on 11/ 19   Oncologist is Dr Lindi Adie     3.   Leukocytosis   Due to #1   Monitor Trend     4.   Lung nodule, solitary   Seen on CTA Chest     5.   DVT Prophylaxis    Lovenox    Code Status:   FULL CODE    Family Communication:    Family( Husband Daughter and Son) at Bedside  Disposition Plan:     Inpatient Med/Surg Bed    Time spent: [facility] Pager [phone]   If [place] Please Contact the Day Rounding Team MD for [facility]  If 7PM-7AM, Please Contact Night-Floor Coverage  [email or web address] Password  [facility] 09/06/2014, 10:29 PM

## 2014-09-06 NOTE — ED Notes (Signed)
Pt transported by EMS. Pt stated that she woke up at 0100 this morning with bilateral hip pain and stated that she had chills. Pt stated she continued to have chills and weakness and stated that "she just couldn't get her body going."

## 2014-09-06 NOTE — ED Notes (Signed)
Pt unable to void at present time, I let pt know we would wait and attempt when bolus was complete. Pt will attempt on bed pan.

## 2014-09-06 NOTE — ED Notes (Signed)
Pt's son in room. He stated that "the patient started chemo Thursday and had an injection yesterday(calcium) and has not felt well since the injection"

## 2014-09-07 DIAGNOSIS — D701 Agranulocytosis secondary to cancer chemotherapy: Secondary | ICD-10-CM | POA: Diagnosis present

## 2014-09-07 DIAGNOSIS — F329 Major depressive disorder, single episode, unspecified: Secondary | ICD-10-CM | POA: Diagnosis present

## 2014-09-07 DIAGNOSIS — R911 Solitary pulmonary nodule: Secondary | ICD-10-CM | POA: Diagnosis present

## 2014-09-07 DIAGNOSIS — M797 Fibromyalgia: Secondary | ICD-10-CM | POA: Diagnosis present

## 2014-09-07 DIAGNOSIS — E785 Hyperlipidemia, unspecified: Secondary | ICD-10-CM | POA: Diagnosis present

## 2014-09-07 DIAGNOSIS — D6959 Other secondary thrombocytopenia: Secondary | ICD-10-CM | POA: Diagnosis present

## 2014-09-07 DIAGNOSIS — D72829 Elevated white blood cell count, unspecified: Secondary | ICD-10-CM | POA: Diagnosis present

## 2014-09-07 DIAGNOSIS — T451X5A Adverse effect of antineoplastic and immunosuppressive drugs, initial encounter: Secondary | ICD-10-CM | POA: Diagnosis present

## 2014-09-07 DIAGNOSIS — Z79899 Other long term (current) drug therapy: Secondary | ICD-10-CM | POA: Diagnosis not present

## 2014-09-07 DIAGNOSIS — K2270 Barrett's esophagus without dysplasia: Secondary | ICD-10-CM | POA: Diagnosis present

## 2014-09-07 DIAGNOSIS — Y95 Nosocomial condition: Secondary | ICD-10-CM | POA: Diagnosis present

## 2014-09-07 DIAGNOSIS — D696 Thrombocytopenia, unspecified: Secondary | ICD-10-CM

## 2014-09-07 DIAGNOSIS — J189 Pneumonia, unspecified organism: Secondary | ICD-10-CM | POA: Diagnosis present

## 2014-09-07 DIAGNOSIS — K219 Gastro-esophageal reflux disease without esophagitis: Secondary | ICD-10-CM | POA: Diagnosis present

## 2014-09-07 DIAGNOSIS — R509 Fever, unspecified: Secondary | ICD-10-CM | POA: Diagnosis present

## 2014-09-07 DIAGNOSIS — F419 Anxiety disorder, unspecified: Secondary | ICD-10-CM | POA: Diagnosis present

## 2014-09-07 DIAGNOSIS — C50219 Malignant neoplasm of upper-inner quadrant of unspecified female breast: Secondary | ICD-10-CM | POA: Diagnosis present

## 2014-09-07 DIAGNOSIS — Z5189 Encounter for other specified aftercare: Secondary | ICD-10-CM

## 2014-09-07 DIAGNOSIS — N179 Acute kidney failure, unspecified: Secondary | ICD-10-CM | POA: Diagnosis present

## 2014-09-07 LAB — INFLUENZA PANEL BY PCR (TYPE A & B)
H1N1 flu by pcr: NOT DETECTED
INFLAPCR: NEGATIVE
Influenza B By PCR: NEGATIVE

## 2014-09-07 LAB — BASIC METABOLIC PANEL
ANION GAP: 9 (ref 5–15)
BUN: 19 mg/dL (ref 6–23)
CHLORIDE: 108 meq/L (ref 96–112)
CO2: 22 meq/L (ref 19–32)
CREATININE: 1.16 mg/dL — AB (ref 0.50–1.10)
Calcium: 8.4 mg/dL (ref 8.4–10.5)
GFR calc Af Amer: 52 mL/min — ABNORMAL LOW (ref >90)
GFR calc non Af Amer: 45 mL/min — ABNORMAL LOW (ref >90)
Glucose, Bld: 141 mg/dL — ABNORMAL HIGH (ref 70–99)
POTASSIUM: 4.2 meq/L (ref 3.7–5.3)
Sodium: 139 mEq/L (ref 137–147)

## 2014-09-07 LAB — CBC
HEMATOCRIT: 31.5 % — AB (ref 36.0–46.0)
HEMOGLOBIN: 10.6 g/dL — AB (ref 12.0–15.0)
MCH: 31.8 pg (ref 26.0–34.0)
MCHC: 33.7 g/dL (ref 30.0–36.0)
MCV: 94.6 fL (ref 78.0–100.0)
Platelets: 137 10*3/uL — ABNORMAL LOW (ref 150–400)
RBC: 3.33 MIL/uL — ABNORMAL LOW (ref 3.87–5.11)
RDW: 13.4 % (ref 11.5–15.5)
WBC: 12.6 10*3/uL — ABNORMAL HIGH (ref 4.0–10.5)

## 2014-09-07 NOTE — Progress Notes (Addendum)
PROGRESS NOTE  Kelli Owens GEX:[id number] DOB: July 27, 1941 DOA: 09/06/2014 PCP: Jerlyn Ly, MD  HPI: Kelli Owens is a 73 y.o. female with a history of Adenocarcinoma of the Right Breast diagnosed 07/2014 who had her first chemotherapy rx 2 days ago and presents to the ED with complaints of Fevers and Chills and nonproductive cough x 1 day. She reports having increased weakness and malaise. She was found on a CTS of the Chest to have a RLL pneumonia, and a solid lung nodule ans well as interstitial edema. She was placed on IV Vancomycn and Cefepime and referred for medical admission.   Subjective/ 24 H Interval events Feeling better this morning  Assessment/Plan: Principal Problem:   HCAP (healthcare-associated pneumonia) Active Problems:   Breast cancer of upper-inner quadrant of right female breast   Leukocytosis   Lung nodule, solitary   Thrombocytopenia   HCAP (healthcare-associated pneumonia) - IV Vancomycin and Cefepime, cultures obtained in the ED - feeling better this morning, on room air - leukocytosis improving Breast cancer of upper-inner quadrant of right - Had First Chemotherapy on 11/ 19 - Oncologist is Dr Lindi Adie, will let him know she was admitted Leukocytosis - Due to #1, improving Lung nodule, solitary - Seen on CTA Chest, can be followed up as an outpatient.  Thrombocytopenia - ?related to #1 vs chemotherapy, monitor.    Diet: regular Fluids: NS DVT Prophylaxis: Lovenox  Code Status: Full Family Communication: none  Disposition Plan: inpatient, home when ready   Consultants:  None   Procedures:  None    Antibiotics Vancomycin 11/21 >> Cefepime 11/21 >>   Studies  Dg Chest 2 View  09/06/2014   CLINICAL DATA:  Short of breath and weakness.  EXAM: CHEST  2 VIEW  COMPARISON:  08/26/2014  FINDINGS: Left-sided power port unchanged. Normal cardiac silhouette. Chronic bronchitic markings noted. No effusion, infiltrate,  pneumothorax.  IMPRESSION: 1. No acute cardiopulmonary process. 2. Chronic bronchitic markings.   Electronically Signed   By: Suzy Bouchard M.D.   On: 09/06/2014 17:43   Ct Angio Chest Pe W/cm &/or Wo Cm  09/06/2014   CLINICAL DATA:  Evaluate for PE, fever and weakness  EXAM: CT ANGIOGRAPHY CHEST WITH CONTRAST  TECHNIQUE: Multidetector CT imaging of the chest was performed using the standard protocol during bolus administration of intravenous contrast. Multiplanar CT image reconstructions and MIPs were obtained to evaluate the vascular anatomy.  CONTRAST:  115mL OMNIPAQUE IOHEXOL 350 MG/ML SOLN  COMPARISON:  Chest radiograph 09/06/2014  FINDINGS: There are no filling defects within pulmonary arteries to suggest acute pulmonary embolism. No acute findings aorta great vessels. No pericardial fluid. Esophagus is normal.  Review of the lung parenchyma demonstrates a lobular septal thickening. There is a sub solid nodule in the right lower lobe measuring 4 mm (image 60, series 7).  No axillary or supraclavicular adenopathy. No mediastinal adenopathy.  Limited upper abdomen is unremarkable. No aggressive osseous lesion.  Review of the MIP images confirms the above findings.  IMPRESSION: 1. No evidence of acute pulmonary embolism. 2. Mild interlobular septal thickening suggests mild interstitial edema. 3. Small sub solid solid nodule in the right lower lobe likely represent small focus of infection. Recommend follow-up CT without contrast in 3 months to determine if this lesion persists.   Electronically Signed   By: Suzy Bouchard M.D.   On: 09/06/2014 20:09     Objective  Filed Vitals:   09/06/14 2003 09/06/14 2253 09/07/14 0006 09/07/14 0433  BP:  110/43 106/55 122/42 102/43  Pulse: 80 83 82 85  Temp: 98.8 F (37.1 C) 98.6 F (37 C) 98.8 F (37.1 C) 99.1 F (37.3 C)  TempSrc: Oral Oral Oral Oral  Resp: 17 18 18 18   Height:  5\' 1"  (1.549 m)    Weight:  79.107 kg (174 lb 6.4 oz)    SpO2: 100%  100% 100% 97%    Intake/Output Summary (Last 24 hours) at 09/07/14 1056 Last data filed at 09/07/14 0900  Gross per 24 hour  Intake   1405 ml  Output      1 ml  Net   1404 ml   Filed Weights   09/06/14 1800 09/06/14 2253  Weight: 77.565 kg (171 lb) 79.107 kg (174 lb 6.4 oz)    Exam:  General:  NAD  Cardiovascular: RRR  Respiratory: CTA biL  Abdomen: soft, non tender  MSK: no edema  Neuro: non focal   Data Reviewed: Basic Metabolic Panel:  Recent Labs Lab 09/06/14 1749 09/07/14 0503  NA 138 139  K 3.8 4.2  CL 103 108  CO2 22 22  GLUCOSE 107* 141*  BUN 22 19  CREATININE 1.21* 1.16*  CALCIUM 8.8 8.4   Liver Function Tests:  Recent Labs Lab 09/06/14 1749  AST 42*  ALT 40*  ALKPHOS 54  BILITOT 0.5  PROT 6.1  ALBUMIN 3.2*   CBC:  Recent Labs Lab 09/06/14 1749 09/07/14 0503  WBC 18.3* 12.6*  NEUTROABS 17.5*  --   HGB 11.6* 10.6*  HCT 34.9* 31.5*  MCV 95.4 94.6  PLT 149* 137*   Scheduled Meds: . ceFEPime (MAXIPIME) IV  1 g Intravenous Q24H  . [START ON 09/15/2014] cyanocobalamin  1,000 mcg Intramuscular Q30 days  . dexamethasone  4 mg Oral BID  . enoxaparin (LOVENOX) injection  40 mg Subcutaneous QHS  . multivitamin with minerals  1 tablet Oral Daily  . vancomycin  1,000 mg Intravenous Q24H   Continuous Infusions: . sodium chloride 75 mL/hr at 09/06/14 2336    Time spent: 35 minutes  Marzetta Board, MD [facility] Pager [phone]. If 7 PM - 7 AM, please contact night-coverage at [email or web address], password [facility] 09/07/2014, 10:56 AM  LOS: 1 day

## 2014-09-08 ENCOUNTER — Telehealth: Payer: Self-pay

## 2014-09-08 ENCOUNTER — Ambulatory Visit: Payer: Medicare Other | Admitting: Hematology and Oncology

## 2014-09-08 LAB — CBC WITH DIFFERENTIAL/PLATELET
BASOS ABS: 0.1 10*3/uL (ref 0.0–0.1)
Basophils Relative: 1 % (ref 0–1)
EOS ABS: 0.1 10*3/uL (ref 0.0–0.7)
Eosinophils Relative: 1 % (ref 0–5)
HCT: 31.5 % — ABNORMAL LOW (ref 36.0–46.0)
Hemoglobin: 10.7 g/dL — ABNORMAL LOW (ref 12.0–15.0)
LYMPHS ABS: 0.3 10*3/uL — AB (ref 0.7–4.0)
Lymphocytes Relative: 3 % — ABNORMAL LOW (ref 12–46)
MCH: 31.8 pg (ref 26.0–34.0)
MCHC: 34 g/dL (ref 30.0–36.0)
MCV: 93.8 fL (ref 78.0–100.0)
Monocytes Absolute: 0.1 10*3/uL (ref 0.1–1.0)
Monocytes Relative: 1 % — ABNORMAL LOW (ref 3–12)
Neutro Abs: 9.3 10*3/uL — ABNORMAL HIGH (ref 1.7–7.7)
Neutrophils Relative %: 94 % — ABNORMAL HIGH (ref 43–77)
Platelets: 108 10*3/uL — ABNORMAL LOW (ref 150–400)
RBC: 3.36 MIL/uL — ABNORMAL LOW (ref 3.87–5.11)
RDW: 13 % (ref 11.5–15.5)
WBC: 9.9 10*3/uL (ref 4.0–10.5)

## 2014-09-08 LAB — COMPREHENSIVE METABOLIC PANEL
ALBUMIN: 3 g/dL — AB (ref 3.5–5.2)
ALK PHOS: 56 U/L (ref 39–117)
ALT: 47 U/L — ABNORMAL HIGH (ref 0–35)
AST: 43 U/L — ABNORMAL HIGH (ref 0–37)
Anion gap: 11 (ref 5–15)
BUN: 16 mg/dL (ref 6–23)
CALCIUM: 9.1 mg/dL (ref 8.4–10.5)
CO2: 22 mEq/L (ref 19–32)
CREATININE: 0.99 mg/dL (ref 0.50–1.10)
Chloride: 104 mEq/L (ref 96–112)
GFR calc Af Amer: 63 mL/min — ABNORMAL LOW (ref >90)
GFR, EST NON AFRICAN AMERICAN: 54 mL/min — AB (ref >90)
GLUCOSE: 160 mg/dL — AB (ref 70–99)
POTASSIUM: 4.3 meq/L (ref 3.7–5.3)
Sodium: 137 mEq/L (ref 137–147)
TOTAL PROTEIN: 6.1 g/dL (ref 6.0–8.3)
Total Bilirubin: 0.4 mg/dL (ref 0.3–1.2)

## 2014-09-08 LAB — URINE CULTURE
COLONY COUNT: NO GROWTH
CULTURE: NO GROWTH

## 2014-09-08 MED ORDER — PROMETHAZINE HCL 25 MG PO TABS
12.5000 mg | ORAL_TABLET | Freq: Four times a day (QID) | ORAL | Status: DC | PRN
  Administered 2014-09-10 – 2014-09-11 (×3): 12.5 mg via ORAL
  Filled 2014-09-08 (×4): qty 1

## 2014-09-08 MED ORDER — PHENOL 1.4 % MT LIQD
1.0000 | OROMUCOSAL | Status: DC | PRN
Start: 2014-09-08 — End: 2014-09-11
  Administered 2014-09-08: 1 via OROMUCOSAL
  Filled 2014-09-08: qty 177

## 2014-09-08 MED ORDER — PROMETHAZINE HCL 25 MG/ML IJ SOLN
6.2500 mg | Freq: Four times a day (QID) | INTRAMUSCULAR | Status: DC | PRN
  Administered 2014-09-08 – 2014-09-11 (×4): 6.25 mg via INTRAVENOUS
  Filled 2014-09-08 (×4): qty 1

## 2014-09-08 MED ORDER — DEXTROSE 5 % IV SOLN
1.0000 g | Freq: Two times a day (BID) | INTRAVENOUS | Status: DC
  Administered 2014-09-08 – 2014-09-10 (×5): 1 g via INTRAVENOUS
  Filled 2014-09-08 (×5): qty 1

## 2014-09-08 MED ORDER — SENNOSIDES-DOCUSATE SODIUM 8.6-50 MG PO TABS
2.0000 | ORAL_TABLET | Freq: Every evening | ORAL | Status: DC | PRN
  Filled 2014-09-08: qty 2

## 2014-09-08 MED ORDER — PROMETHAZINE HCL 25 MG RE SUPP
12.5000 mg | Freq: Four times a day (QID) | RECTAL | Status: DC | PRN
  Filled 2014-09-08: qty 1

## 2014-09-08 MED ORDER — GLYCERIN (LAXATIVE) 2.1 G RE SUPP
1.0000 | Freq: Every day | RECTAL | Status: DC | PRN
Start: 2014-09-08 — End: 2014-09-11
  Administered 2014-09-08: 1 via RECTAL
  Filled 2014-09-08 (×2): qty 1

## 2014-09-08 MED ORDER — DOCUSATE SODIUM 100 MG PO CAPS
100.0000 mg | ORAL_CAPSULE | Freq: Two times a day (BID) | ORAL | Status: DC
  Administered 2014-09-08 – 2014-09-11 (×6): 100 mg via ORAL
  Filled 2014-09-08 (×7): qty 1

## 2014-09-08 NOTE — Plan of Care (Signed)
Problem: Consults Goal: General Medical Patient Education See Patient Education Module for specific education.  Outcome: Completed/Met Date Met:  09/08/14  Problem: Phase I Progression Outcomes Goal: Pain controlled with appropriate interventions Outcome: Completed/Met Date Met:  09/08/14 Goal: OOB as tolerated unless otherwise ordered Outcome: Completed/Met Date Met:  09/08/14 Goal: Voiding-avoid urinary catheter unless indicated Outcome: Completed/Met Date Met:  09/08/14 Goal: Hemodynamically stable Outcome: Completed/Met Date Met:  09/08/14

## 2014-09-08 NOTE — Progress Notes (Signed)
  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 Patient received chemotherapy and 19th of November. She felt very well for the first 2 days. Subsequently she noted fever 202 for high. She was also feeling very dizzy and weak. So she decided to come to the hospital. In the hospital showed a chest x-ray which showed some infiltrative changes. This led to a CT of the chest that revealed what appeared to be hospital acquired pneumonia. She was admitted to the hospital and started on IV antibiotics and she is feeling a lot better today. She had dry cough. No productive sputum.  OBJECTIVE PHYSICAL EXAMINATION: ECOG PERFORMANCE STATUS: 2 - Symptomatic, <50% confined to bed  Filed Vitals:   09/08/14 1000  BP: 119/64  Pulse: 73  Temp: 98.7 F (37.1 C)  Resp: 16   Filed Weights   09/06/14 1800 09/06/14 2253  Weight: 171 lb (77.565 kg) 174 lb 6.4 oz (79.107 kg)    GENERAL:alert, no distress and comfortable SKIN: skin color, texture, turgor are normal, no rashes or significant lesions EYES: normal, Conjunctiva are pink and non-injected, sclera clear OROPHARYNX:no exudate, no erythema and lips, buccal mucosa, and tongue normal  NECK: supple, thyroid normal size, non-tender, without nodularity LYMPH:  no palpable lymphadenopathy in the cervical, axillary or inguinal LUNGS: clear to auscultation and percussion with normal breathing effort HEART: regular rate & rhythm and no murmurs and no lower extremity edema ABDOMEN:abdomen soft, non-tender and normal  bowel sounds Musculoskeletal:no cyanosis of digits and no clubbing  NEURO: alert & oriented x 3 with fluent speech, no focal motor/sensory deficits  LABORATORY DATA:   Lab Results  Component Value Date   WBC 9.9 09/08/2014   HGB 10.7* 09/08/2014   HCT 31.5* 09/08/2014   MCV 93.8 09/08/2014   PLT 108* 09/08/2014   NEUTROABS 9.3* 09/08/2014    ASSESSMENT AND PLAN:  1. Pneumonia: Fevers with lung infiltrates post chemotherapy concerning for pneumonia. I reviewed the x-ray and CT scan. Is not very clear that the lung infiltrative changes are truly related to infection. She does have decent blood counts currently but it is very likely that these blood counts would drop for the next 3-4 days.  2. Once her fever subsided and blood cultures did not reveal any clearcut etiology, she could be discharged home on oral antibiotic. However if she develops more fevers and she is neutropenic, she may need to stay in the hospital until the neutropenia is resolved.  3. Breast cancer currently on neoadjuvant chemotherapy: I be reducing her dosage for the next chemotherapy based on the current problems.  Patient is an appointment to see me on Wednesday. If she remains in the hospital, I will follow her as an inpatient.

## 2014-09-08 NOTE — Telephone Encounter (Signed)
Triage Call Report received from Call A Nurse dtd 09/06/14 2:55 pm.  Sent to scan.

## 2014-09-08 NOTE — Progress Notes (Signed)
PROGRESS NOTE  Kelli Owens [id number]:[id number] DOB: Apr 25, 1941 DOA: 09/06/2014 PCP: Jerlyn Ly, MD  HPI: Kelli Owens is a 73 y.o. female with a history of Adenocarcinoma of the Right Breast diagnosed 07/2014 who had her first chemotherapy rx 2 days ago and presents to the ED with complaints of Fevers and Chills and nonproductive cough x 1 day. She reports having increased weakness and malaise. She was found on a CTS of the Chest to have a RLL pneumonia, and a solid lung nodule ans well as interstitial edema. She was placed on IV Vancomycn and Cefepime and referred for medical admission.   Subjective/ 24 H Interval events Feeling better this morning  Assessment/Plan: Principal Problem:   HCAP (healthcare-associated pneumonia) Active Problems:   Breast cancer of upper-inner quadrant of right female breast   Leukocytosis   Lung nodule, solitary   Thrombocytopenia   HCAP (healthcare-associated pneumonia) - IV Vancomycin and Cefepime, cultures obtained in the ED - feeling better this morning, on room air - leukocytosis improving, concern for becoming neutropenic in the upcoming days  - febrile last night, continue broad spectrum IV antibiotics for now  Breast cancer of upper-inner quadrant of right - Had First Chemotherapy on 11/ 19 - Oncologist is Dr Lindi Adie, discussed with him this morning  Leukocytosis - Due to #1, improving  Lung nodule, solitary - Seen on CTA Chest, can be followed up as an outpatient.   Thrombocytopenia - ?related to #1 vs chemotherapy, monitor, discontinue Lovenox if < 100K   Diet: regular Fluids: NS DVT Prophylaxis: Lovenox  Code Status: Full Family Communication: none  Disposition Plan: inpatient, home when ready   Consultants:  Oncology    Procedures:  None    Antibiotics Vancomycin 11/21 >> Cefepime 11/21 >>   Studies  Objective  Filed Vitals:   09/07/14 1624 09/07/14 2034 09/08/14 0015 09/08/14 0417  BP:  123/52  141/54 118/48  Pulse:  75 72 72  Temp: 99.9 F (37.7 C) 100.1 F (37.8 C) 100.5 F (38.1 C) 98.9 F (37.2 C)  TempSrc: Oral Oral Oral Oral  Resp:  18 18 18   Height:      Weight:      SpO2:  95% 95% 95%    Intake/Output Summary (Last 24 hours) at 09/08/14 0848 Last data filed at 09/07/14 1844  Gross per 24 hour  Intake    320 ml  Output      4 ml  Net    316 ml   Filed Weights   09/06/14 1800 09/06/14 2253  Weight: 77.565 kg (171 lb) 79.107 kg (174 lb 6.4 oz)    Exam:  General:  NAD  Cardiovascular: RRR  Respiratory: CTA biL  Abdomen: soft, non tender  MSK: no edema  Neuro: non focal   Data Reviewed: Basic Metabolic Panel:  Recent Labs Lab 09/06/14 1749 09/07/14 0503 09/08/14 0535  NA 138 139 137  K 3.8 4.2 4.3  CL 103 108 104  CO2 22 22 22   GLUCOSE 107* 141* 160*  BUN 22 19 16   CREATININE 1.21* 1.16* 0.99  CALCIUM 8.8 8.4 9.1   Liver Function Tests:  Recent Labs Lab 09/06/14 1749 09/08/14 0535  AST 42* 43*  ALT 40* 47*  ALKPHOS 54 56  BILITOT 0.5 0.4  PROT 6.1 6.1  ALBUMIN 3.2* 3.0*   CBC:  Recent Labs Lab 09/06/14 1749 09/07/14 0503 09/08/14 0535  WBC 18.3* 12.6* 9.9  NEUTROABS 17.5*  --  9.3*  HGB 11.6*  10.6* 10.7*  HCT 34.9* 31.5* 31.5*  MCV 95.4 94.6 93.8  PLT 149* 137* 108*   Scheduled Meds: . ceFEPime (MAXIPIME) IV  1 g Intravenous Q24H  . [START ON 09/15/2014] cyanocobalamin  1,000 mcg Intramuscular Q30 days  . enoxaparin (LOVENOX) injection  40 mg Subcutaneous QHS  . multivitamin with minerals  1 tablet Oral Daily  . vancomycin  1,000 mg Intravenous Q24H   Continuous Infusions: . sodium chloride 75 mL/hr at 09/06/14 2336    Time spent: 25 minutes  Marzetta Board, MD [facility] Pager [phone]. If 7 PM - 7 AM, please contact night-coverage at [email or web address], password [facility] 09/08/2014, 8:48 AM  LOS: 2 days

## 2014-09-09 DIAGNOSIS — D72819 Decreased white blood cell count, unspecified: Secondary | ICD-10-CM

## 2014-09-09 DIAGNOSIS — T451X5A Adverse effect of antineoplastic and immunosuppressive drugs, initial encounter: Secondary | ICD-10-CM

## 2014-09-09 LAB — CBC WITH DIFFERENTIAL/PLATELET
BASOS ABS: 0.1 10*3/uL (ref 0.0–0.1)
Basophils Relative: 2 % — ABNORMAL HIGH (ref 0–1)
Eosinophils Absolute: 0 10*3/uL (ref 0.0–0.7)
Eosinophils Relative: 1 % (ref 0–5)
HCT: 31.8 % — ABNORMAL LOW (ref 36.0–46.0)
Hemoglobin: 10.7 g/dL — ABNORMAL LOW (ref 12.0–15.0)
LYMPHS ABS: 0.7 10*3/uL (ref 0.7–4.0)
Lymphocytes Relative: 25 % (ref 12–46)
MCH: 31.8 pg (ref 26.0–34.0)
MCHC: 33.6 g/dL (ref 30.0–36.0)
MCV: 94.6 fL (ref 78.0–100.0)
MONO ABS: 0.1 10*3/uL (ref 0.1–1.0)
Monocytes Relative: 5 % (ref 3–12)
Neutro Abs: 2 10*3/uL (ref 1.7–7.7)
Neutrophils Relative %: 67 % (ref 43–77)
PLATELETS: 80 10*3/uL — AB (ref 150–400)
RBC: 3.36 MIL/uL — ABNORMAL LOW (ref 3.87–5.11)
RDW: 13 % (ref 11.5–15.5)
WBC: 2.9 10*3/uL — ABNORMAL LOW (ref 4.0–10.5)

## 2014-09-09 LAB — COMPREHENSIVE METABOLIC PANEL
ALBUMIN: 3 g/dL — AB (ref 3.5–5.2)
ALT: 40 U/L — ABNORMAL HIGH (ref 0–35)
AST: 38 U/L — AB (ref 0–37)
Alkaline Phosphatase: 51 U/L (ref 39–117)
Anion gap: 14 (ref 5–15)
BUN: 19 mg/dL (ref 6–23)
CALCIUM: 9.1 mg/dL (ref 8.4–10.5)
CO2: 22 meq/L (ref 19–32)
Chloride: 104 mEq/L (ref 96–112)
Creatinine, Ser: 1.11 mg/dL — ABNORMAL HIGH (ref 0.50–1.10)
GFR calc Af Amer: 55 mL/min — ABNORMAL LOW (ref >90)
GFR, EST NON AFRICAN AMERICAN: 47 mL/min — AB (ref >90)
Glucose, Bld: 98 mg/dL (ref 70–99)
Potassium: 4.3 mEq/L (ref 3.7–5.3)
Sodium: 140 mEq/L (ref 137–147)
Total Bilirubin: 0.4 mg/dL (ref 0.3–1.2)
Total Protein: 5.9 g/dL — ABNORMAL LOW (ref 6.0–8.3)

## 2014-09-09 MED ORDER — SODIUM CHLORIDE 0.9 % IJ SOLN
10.0000 mL | INTRAMUSCULAR | Status: DC | PRN
  Administered 2014-09-10: 10 mL
  Filled 2014-09-09: qty 40

## 2014-09-09 MED ORDER — PROMETHAZINE HCL 25 MG/ML IJ SOLN
12.5000 mg | Freq: Once | INTRAMUSCULAR | Status: AC
  Administered 2014-09-09: 12.5 mg via INTRAVENOUS
  Filled 2014-09-09: qty 1

## 2014-09-09 NOTE — Progress Notes (Signed)
PROGRESS NOTE  Kelli Owens TIW:[id number] DOB: [birth year] DOA: 09/06/2014 PCP: Jerlyn Ly, MD  HPI: Kelli Owens is a 73 y.o. female with a history of Adenocarcinoma of the Right Breast diagnosed 07/2014 who had her first chemotherapy rx 2 days ago and presents to the ED with complaints of Fevers and Chills and nonproductive cough x 1 day. She reports having increased weakness and malaise. She was found on a CTS of the Chest to have a RLL pneumonia, and a solid lung nodule ans well as interstitial edema. She was placed on IV Vancomycn and Cefepime and referred for medical admission.   Subjective/ 24 H Interval events Feeling better this morning, afebrile overnight  Assessment/Plan: Principal Problem:   HCAP (healthcare-associated pneumonia) Active Problems:   Breast cancer of upper-inner quadrant of right female breast   Leukocytosis   Lung nodule, solitary   Thrombocytopenia   HCAP (healthcare-associated pneumonia) - IV Vancomycin and Cefepime, cultures obtained in the ED - feeling better this morning, on room air, close to baseline - Initially with significant leukocytosis in the setting of an active infection, however progressively becoming more leukopenic as expected following chemotherapy  Breast cancer of upper-inner quadrant of right - Had First Chemotherapy on 11/ 19 - Oncologist is Dr Lindi Adie, discussed with him our case and he has seen her yesterday - She is becoming more pancytopenic following the chemotherapy last week, we'll continue to monitor her white count and ANC as well as her platelets and monitor here until this stabilizes  Leukocytosis - resolved, now heading towards leukopenia  Lung nodule, solitary - Seen on CTA Chest, can be followed up as an outpatient.   Thrombocytopenia - ?related to #1 vs chemotherapy, monitor -platelets decreased to less than 100 K today, discontinue Lovenox    Diet: regular Fluids: NS DVT Prophylaxis:   SCDs  Code Status: Full Family Communication: Discussed with her husband and son that side Disposition Plan: inpatient, home when ready   Consultants:  Oncology    Procedures:  None    Antibiotics Vancomycin 11/21 >> Cefepime 11/21 >>   Studies  Objective  Filed Vitals:   09/08/14 1400 09/08/14 2014 09/09/14 0009 09/09/14 0300  BP: 149/76 106/76 129/56 112/49  Pulse: 69 83 79 72  Temp: 98.6 F (37 C) 99.5 F (37.5 C) 98.9 F (37.2 C) 98.6 F (37 C)  TempSrc:  Oral Oral Oral  Resp: 18 18 18 18   Height:      Weight:      SpO2: 100% 99% 97% 97%    Intake/Output Summary (Last 24 hours) at 09/09/14 0919 Last data filed at 09/08/14 1853  Gross per 24 hour  Intake    120 ml  Output      0 ml  Net    120 ml   Filed Weights   09/06/14 1800 09/06/14 2253  Weight: 77.565 kg (171 lb) 79.107 kg (174 lb 6.4 oz)    Exam:  General:  NAD  Cardiovascular: RRR  Respiratory: CTA biL  Abdomen: soft, non tender  MSK: no edema  Neuro: non focal   Data Reviewed: Basic Metabolic Panel:  Recent Labs Lab 09/06/14 1749 09/07/14 0503 09/08/14 0535 09/09/14 0500  NA 138 139 137 140  K 3.8 4.2 4.3 4.3  CL 103 108 104 104  CO2 22 22 22 22   GLUCOSE 107* 141* 160* 98  BUN 22 19 16 19   CREATININE 1.21* 1.16* 0.99 1.11*  CALCIUM 8.8 8.4 9.1 9.1  Liver Function Tests:  Recent Labs Lab 09/06/14 1749 09/08/14 0535 09/09/14 0500  AST 42* 43* 38*  ALT 40* 47* 40*  ALKPHOS 54 56 51  BILITOT 0.5 0.4 0.4  PROT 6.1 6.1 5.9*  ALBUMIN 3.2* 3.0* 3.0*   CBC:  Recent Labs Lab 09/06/14 1749 09/07/14 0503 09/08/14 0535 09/09/14 0500  WBC 18.3* 12.6* 9.9 2.9*  NEUTROABS 17.5*  --  9.3* 2.0  HGB 11.6* 10.6* 10.7* 10.7*  HCT 34.9* 31.5* 31.5* 31.8*  MCV 95.4 94.6 93.8 94.6  PLT 149* 137* 108* 80*   Scheduled Meds: . ceFEPime (MAXIPIME) IV  1 g Intravenous Q12H  . [START ON 09/15/2014] cyanocobalamin  1,000 mcg Intramuscular Q30 days  . docusate sodium   100 mg Oral BID  . multivitamin with minerals  1 tablet Oral Daily  . vancomycin  1,000 mg Intravenous Q24H   Continuous Infusions: . sodium chloride 75 mL/hr at 09/06/14 2336    Time spent: 25 minutes  Marzetta Board, MD [facility] Pager [phone]. If 7 PM - 7 AM, please contact night-coverage at [email or web address], password [facility] 09/09/2014, 9:19 AM  LOS: 3 days

## 2014-09-09 NOTE — Plan of Care (Signed)
Problem: Consults Goal: Skin Care Protocol Initiated - if Braden Score 18 or less If consults are not indicated, leave blank or document N/A  Outcome: Not Applicable Date Met:  51/46/04 Goal: Nutrition Consult-if indicated Outcome: Not Applicable Date Met:  79/98/72 Goal: Diabetes Guidelines if Diabetic/Glucose > 140 If diabetic or lab glucose is > 140 mg/dl - Initiate Diabetes/Hyperglycemia Guidelines & Document Interventions  Outcome: Not Applicable Date Met:  15/87/27  Problem: Phase I Progression Outcomes Goal: Initial discharge plan identified Outcome: Completed/Met Date Met:  09/09/14  Problem: Phase II Progression Outcomes Goal: Progress activity as tolerated unless otherwise ordered Outcome: Completed/Met Date Met:  09/09/14 Goal: Discharge plan established Outcome: Completed/Met Date Met:  09/09/14 Goal: Vital signs remain stable Outcome: Completed/Met Date Met:  09/09/14 Goal: Obtain order to discontinue catheter if appropriate Outcome: Not Applicable Date Met:  61/84/85  Problem: Phase III Progression Outcomes Goal: Pain controlled on oral analgesia Outcome: Completed/Met Date Met:  09/09/14 Goal: Activity at appropriate level-compared to baseline (UP IN CHAIR FOR HEMODIALYSIS)  Outcome: Completed/Met Date Met:  09/09/14 Goal: Voiding independently Outcome: Completed/Met Date Met:  09/09/14 Goal: Foley discontinued Outcome: Not Applicable Date Met:  92/76/39 Goal: Discharge plan remains appropriate-arrangements made Outcome: Progressing

## 2014-09-09 NOTE — Progress Notes (Signed)
PT Cancellation Note  Patient Details Name: Kelli Owens MRN: [id number] DOB: 11/10/1940   Cancelled Treatment:    Reason Eval/Treat Not Completed: Fatigue/lethargy limiting ability to participate (Pt stated she's "worn out" right now. Will re-attempt tomorrow. )   Philomena Doheny 09/09/2014, 2:52 PM  [phone]

## 2014-09-10 ENCOUNTER — Other Ambulatory Visit: Payer: Medicare Other

## 2014-09-10 ENCOUNTER — Ambulatory Visit: Payer: Medicare Other | Admitting: Hematology and Oncology

## 2014-09-10 DIAGNOSIS — D701 Agranulocytosis secondary to cancer chemotherapy: Secondary | ICD-10-CM

## 2014-09-10 DIAGNOSIS — D6959 Other secondary thrombocytopenia: Secondary | ICD-10-CM

## 2014-09-10 LAB — BASIC METABOLIC PANEL
ANION GAP: 12 (ref 5–15)
BUN: 12 mg/dL (ref 6–23)
CO2: 22 meq/L (ref 19–32)
Calcium: 8.7 mg/dL (ref 8.4–10.5)
Chloride: 104 mEq/L (ref 96–112)
Creatinine, Ser: 0.99 mg/dL (ref 0.50–1.10)
GFR calc Af Amer: 63 mL/min — ABNORMAL LOW (ref >90)
GFR calc non Af Amer: 54 mL/min — ABNORMAL LOW (ref >90)
Glucose, Bld: 95 mg/dL (ref 70–99)
POTASSIUM: 3.9 meq/L (ref 3.7–5.3)
SODIUM: 138 meq/L (ref 137–147)

## 2014-09-10 LAB — CBC WITH DIFFERENTIAL/PLATELET
Basophils Absolute: 0 10*3/uL (ref 0.0–0.1)
Basophils Relative: 0 % (ref 0–1)
Eosinophils Absolute: 0.1 10*3/uL (ref 0.0–0.7)
Eosinophils Relative: 4 % (ref 0–5)
HCT: 30.2 % — ABNORMAL LOW (ref 36.0–46.0)
Hemoglobin: 10.3 g/dL — ABNORMAL LOW (ref 12.0–15.0)
LYMPHS ABS: 0.7 10*3/uL (ref 0.7–4.0)
Lymphocytes Relative: 51 % — ABNORMAL HIGH (ref 12–46)
MCH: 31.5 pg (ref 26.0–34.0)
MCHC: 34.1 g/dL (ref 30.0–36.0)
MCV: 92.4 fL (ref 78.0–100.0)
MONOS PCT: 26 % — AB (ref 3–12)
Monocytes Absolute: 0.3 10*3/uL (ref 0.1–1.0)
NEUTROS PCT: 19 % — AB (ref 43–77)
Neutro Abs: 0.2 10*3/uL — ABNORMAL LOW (ref 1.7–7.7)
Platelets: 79 10*3/uL — ABNORMAL LOW (ref 150–400)
RBC: 3.27 MIL/uL — ABNORMAL LOW (ref 3.87–5.11)
RDW: 12.8 % (ref 11.5–15.5)
WBC: 1.3 10*3/uL — CL (ref 4.0–10.5)

## 2014-09-10 MED ORDER — VALACYCLOVIR HCL 500 MG PO TABS
500.0000 mg | ORAL_TABLET | Freq: Two times a day (BID) | ORAL | Status: DC
  Administered 2014-09-10 – 2014-09-11 (×2): 500 mg via ORAL
  Filled 2014-09-10 (×3): qty 1

## 2014-09-10 MED ORDER — CEFUROXIME AXETIL 500 MG PO TABS
500.0000 mg | ORAL_TABLET | Freq: Two times a day (BID) | ORAL | Status: DC
  Administered 2014-09-11 (×2): 500 mg via ORAL
  Filled 2014-09-10 (×3): qty 1

## 2014-09-10 NOTE — Progress Notes (Signed)
PROGRESS NOTE  Kelli Owens FAO:[id number] DOB: [birth year] DOA: 09/06/2014 PCP: Jerlyn Ly, MD Oncologist Dr. Pamala Duffel.  Summary: 73 year old woman with breast cancer, started chemotherapy approximately 2 days prior to admission, presented with fever, chills and cough. Admitted for right lower lobe pneumonia, sepsis  Assessment/Plan: 1. Pneumonia, treated as HCAP because of immunocompromise. No reason to suspect MRSA at this point. 2. Neutropenia, thrombocytopenia secondary to chemotherapy, no further evaluation per oncology. 3. Acute renal failure secondary to acute illness, resolved. 4. Breast cancer,  status post chemotherapy 11/19  5. Elevated transaminases, less than 2 times normal, unclear etiology. Follow-up as an outpatient. Possibly related to chemotherapy. 6. Lung nodule. Follow-up as an outpatient.  7. Depression, anxiety, acid reflux   Afebrile for 72 hours. Blood cultures, urine cultures unremarkable thus far. Overall much improved. Plan transition oral antibiotics. Treat nausea. If can tolerate diet and keep down medications anticipate discharge 11/26.  Code Status: full code DVT prophylaxis: SCDs Family Communication: discussed with husband at beside Disposition Plan:   Murray Hodgkins, MD  [facility]  Pager [phone] If 7PM-7AM, please contact night-coverage at [email or web address], password [facility] 09/10/2014, 6:03 PM  LOS: 4 days   Consultants:  Oncology   Procedures:    Antibiotics: Vancomycin 11/21 >> 11/25 Cefepime 11/21 >> 11/25 Ceftin 11/26 >>  HPI/Subjective: Oncology saw today, recommended discharge home. Afebrile greater than 48 hours.  Overall feeling better except she has a significant amount of nausea and had an episode of vomiting earlier today. Breathing well.  Objective: Filed Vitals:   09/10/14 0159 09/10/14 0524 09/10/14 1000 09/10/14 1427  BP: 115/42 119/55 125/54 126/54  Pulse: 80 83 78 81  Temp: 98.6 F (37 C) 98.9 F  (37.2 C) 98.1 F (36.7 C) 98.7 F (37.1 C)  TempSrc: Oral Oral Oral Oral  Resp: 16 18 18 18   Height:      Weight:      SpO2: 99% 96% 100% 100%    Intake/Output Summary (Last 24 hours) at 09/10/14 1803 Last data filed at 09/10/14 0845  Gross per 24 hour  Intake      0 ml  Output      3 ml  Net     -3 ml     Filed Weights   09/06/14 1800 09/06/14 2253  Weight: 77.565 kg (171 lb) 79.107 kg (174 lb 6.4 oz)    Exam:     Afebrile greater than 48 hours, vital signs stable. No hypoxia.   General: Appears calm, comfortable.  Psych: Speech fluent and clear.  CV: Regular rate and rhythm. No murmur, rub or gallop.  Respiratory: Clear to auscultation bilaterally. No wheezes, rales or rhonchi. Normal respiratory effort.  Data Reviewed:  Urine culture no growth, blood cultures no growth to date   Basic metabolic panel unremarkable  WBC 1.3. Hemoglobin stable 10.3. Platelet count 79, stable.  CT chest with no evidence of pulmonary embolism.  Scheduled Meds: . ceFEPime (MAXIPIME) IV  1 g Intravenous Q12H  . [START ON 09/15/2014] cyanocobalamin  1,000 mcg Intramuscular Q30 days  . docusate sodium  100 mg Oral BID  . multivitamin with minerals  1 tablet Oral Daily  . vancomycin  1,000 mg Intravenous Q24H   Continuous Infusions: . sodium chloride 75 mL/hr at 09/06/14 2336    Principal Problem:   HCAP (healthcare-associated pneumonia) Active Problems:   Breast cancer of upper-inner quadrant of right female breast   Leukocytosis   Lung nodule, solitary   Thrombocytopenia   Time  spent 20 minutes

## 2014-09-10 NOTE — Progress Notes (Signed)
CRITICAL VALUE ALERT  Critical value received:  WBC 1.3   Date of notification:  09/10/14  Time of notification:  0625  Critical value read back:Yes.    Nurse who received alert:  Jearld Shines  MD notified (1st page):  Sarajane Jews  Time of first page:  0702   Responding MD:  Sarajane Jews  Time MD responded:  [phone]

## 2014-09-10 NOTE — Progress Notes (Signed)
SUBJECTIVE: afebrile, feeling stronger  OBJECTIVE PHYSICAL EXAMINATION: ECOG PERFORMANCE STATUS: 2 - Symptomatic, <50% confined to bed  Filed Vitals:   09/10/14 0524  BP: 119/55  Pulse: 83  Temp: 98.9 F (37.2 C)  Resp: 18   Filed Weights   09/06/14 1800 09/06/14 2253  Weight: 171 lb (77.565 kg) 174 lb 6.4 oz (79.107 kg)    GENERAL:alert, no distress and comfortable SKIN: skin color, texture, turgor are normal, no rashes or significant lesions EYES: normal, Conjunctiva are pink and non-injected, sclera clear OROPHARYNX:no exudate, no erythema and lips, buccal mucosa, and tongue normal  NECK: supple, thyroid normal size, non-tender, without nodularity LYMPH:  no palpable lymphadenopathy in the cervical, axillary or inguinal LUNGS: clear to auscultation and percussion with normal breathing effort HEART: regular rate & rhythm and no murmurs and no lower extremity edema ABDOMEN:abdomen soft, non-tender and normal bowel sounds Musculoskeletal:no cyanosis of digits and no clubbing  NEURO: alert & oriented x 3 with fluent speech, no focal motor/sensory deficits  LABORATORY DATA:  I have reviewed the data as listed @LASTCHEMISTRY @  Lab Results  Component Value Date   WBC 1.3* 09/10/2014   HGB 10.3* 09/10/2014   HCT 30.2* 09/10/2014   MCV 92.4 09/10/2014   PLT 79* 09/10/2014   NEUTROABS 0.2* 09/10/2014    ASSESSMENT AND PLAN: 1. Pneumonia on CT scan and chest x-ray: Patient was been treated with antibiotics. She does not have any fevers or the past 3 days. Her blood cultures and urine cultures have all been negative so far. 2. Neutropenia: Secondary to chemotherapy. Her counts will stop recover in the next 2-3 days. 3. Thrombocytopenia also secondary to chemotherapy. I discussed with her that she could be discharged home today and to be careful and cautious avoiding any sick contacts at home. Since she is afebrile and all the cultures have all been negative, I believe it is  safe to discharge her home.

## 2014-09-10 NOTE — Progress Notes (Signed)
PT Cancellation Note  Patient Details Name: CHARENE MCCALLISTER MRN: [id number] DOB: 24-Jan-1941   Cancelled Treatment:    Reason Eval/Treat Not Completed: PT screened, no needs identified, will sign off (Pt stated she's been walking to the bathroom without difficulty and is going home today. She declined PT. ) PT signing off.    Blondell Reveal Kistler 09/10/2014, 10:45 AM  [phone]

## 2014-09-11 ENCOUNTER — Inpatient Hospital Stay (HOSPITAL_COMMUNITY): Payer: Medicare Other

## 2014-09-11 MED ORDER — HEPARIN SOD (PORK) LOCK FLUSH 100 UNIT/ML IV SOLN
500.0000 [IU] | INTRAVENOUS | Status: AC | PRN
  Administered 2014-09-11: 500 [IU]

## 2014-09-11 MED ORDER — CEFUROXIME AXETIL 500 MG PO TABS: 500.0000 mg | ORAL_TABLET | Freq: Two times a day (BID) | ORAL | Status: DC

## 2014-09-11 MED ORDER — GI COCKTAIL ~~LOC~~
30.0000 mL | Freq: Once | ORAL | Status: AC
  Administered 2014-09-11: 30 mL via ORAL
  Filled 2014-09-11: qty 30

## 2014-09-11 NOTE — Discharge Summary (Signed)
Physician Discharge Summary  Kelli Owens ZCH:[id number] DOB: [birth year] DOA: 09/06/2014  PCP: Kelli Ly, Owens Oncologist Dr. Pamala Owens.  Admit date: 09/06/2014 Discharge date: 09/11/2014  Recommendations for Outpatient Follow-up:  1. Resolution of pneumonia 2. Resolution of neutropenia, thrombocytopenia secondary to chemotherapy  3. Mild elevation of transaminases likely related to chemotherapy 4. Resolution of lung nodule. Consider repeat imaging as an outpatient. See CT report below.   Follow-up Information    Follow up with Kelli Owens, Owens. Schedule an appointment as soon as possible for Owens visit in 1 week.   Specialty:  Internal Medicine   Contact information:   [address] [place] [address] [phone]      Discharge Diagnoses:  1. Pneumonia 2. Neutropenia, thrombocytopenia secondary to chemotherapy  3. Nausea without vomiting 4. Acute renal failure 5. Breast cancer currently undergoing chemotherapy 6. Mild elevation of serum transaminases 7. Lung nodule  Discharge Condition: improved Disposition: home   Diet recommendation: regular  Filed Weights   09/06/14 1800 09/06/14 2253  Weight: 77.565 kg (171 lb) 79.107 kg (174 lb 6.4 oz)    History of present illness:  73 year old woman with breast cancer, started chemotherapy approximately 2 days prior to admission, presented with fever, chills and cough. Admitted for right lower lobe pneumonia, sepsis  Hospital Course:  Kelli Owens was admitted and treated with empiric antibiotics for pneumonia. Her condition gradually improved and respiratory status stabilized. She had development of neutropenia from cytopenia presumably related to chemotherapy. She was evaluated by oncology and cleared for discharge as below. She did have nausea without vomiting but currently is tolerating diet. Individual issues as below.  1. Pneumonia, treated as HCAP because of immunocompromise. No reason to suspect MRSA.  Appears clinically resolved. 2. Neutropenia, thrombocytopenia secondary to chemotherapy, no further evaluation per oncology. Counts expected to spontaneously improve soon per oncology. 3. Nausea without vomiting. Tolerating diet. No abdominal pain. Plain film of the abdomen unremarkable. Likely related to chemotherapy. She has nausea medication at home. She tolerated lunch. She requested discharge home. Discussed her options with family present and she desired discharge. She knows to return for recurrent vomiting or worsening of condition. 4. Acute renal failure secondary to acute illness, resolved. 5. Breast cancer, status post chemotherapy 11/19  6. Elevated transaminases, less than 2 times normal, unclear etiology. Follow-up as an outpatient. Possibly related to chemotherapy. 7. Lung nodule. Follow-up as an outpatient.  8. Depression, anxiety, acid reflux  Consultants:  Oncology  Procedures:    Antibiotics: Vancomycin 11/21 >> 11/25 Cefepime 11/21 >> 11/25 Ceftin 11/26 >> 11/28  Discharge Instructions  Discharge Instructions    Diet general    Complete by:  As directed      Discharge instructions    Complete by:  As directed   Call your physician or seek immediate medical attention for fever, shortness of breath, vomiting or worsening of condition.     Increase activity slowly    Complete by:  As directed           Current Discharge Medication List    START taking these medications   Details  cefUROXime (CEFTIN) 500 MG tablet Take 1 tablet (500 mg total) by mouth 2 (two) times daily with Owens meal. Qty: 5 tablet, Refills: 0      CONTINUE these medications which have NOT CHANGED   Details  cyanocobalamin (,VITAMIN B-12,) 1000 MCG/ML injection Inject 1,000 mcg into the muscle every 30 (thirty) days.    dexamethasone (DECADRON) 4 MG  tablet Take 1 tablet (4 mg total) by mouth 2 (two) times daily. Start the day before Taxotere. Then again the day after chemo for 3  days. Qty: 30 tablet, Refills: 1   Associated Diagnoses: Breast cancer of upper-inner quadrant of right female breast    HYDROcodone-acetaminophen (NORCO/VICODIN) 5-325 MG per tablet Take 1-2 tablets by mouth every 6 (six) hours as needed. Qty: 30 tablet, Refills: 0    lidocaine-prilocaine (EMLA) cream Apply 1 application topically as needed. Qty: 30 g, Refills: 0    LORazepam (ATIVAN) 0.5 MG tablet Take 0.5 mg by mouth every 6 (six) hours as needed for anxiety.    LYRICA 75 MG capsule Take 75 mg by mouth 3 (three) times daily as needed (for pain).  Refills: 0    Multiple Vitamin (MULTIVITAMIN WITH MINERALS) TABS tablet Take 1 tablet by mouth daily.    ondansetron (ZOFRAN) 8 MG tablet Take 1 tablet (8 mg total) by mouth 2 (two) times daily. Start the day after chemo for 3 days. Then take as needed for nausea or vomiting. Qty: 30 tablet, Refills: 1   Associated Diagnoses: Breast cancer of upper-inner quadrant of right female breast    temazepam (RESTORIL) 30 MG capsule Take 30 mg by mouth at bedtime as needed for sleep.    Vitamin D, Ergocalciferol, (DRISDOL) 50000 UNITS CAPS Take 50,000 Units by mouth every 7 (seven) days. Takes on Thursday or Friday       No Known Allergies  The results of significant diagnostics from this hospitalization (including imaging, microbiology, ancillary and laboratory) are listed below for reference.    Significant Diagnostic Studies: Dg Chest 2 View  09/06/2014   CLINICAL DATA:  Short of breath and weakness.  EXAM: CHEST  2 VIEW  COMPARISON:  08/26/2014  FINDINGS: Left-sided power port unchanged. Normal cardiac silhouette. Chronic bronchitic markings noted. No effusion, infiltrate, pneumothorax.  IMPRESSION: 1. No acute cardiopulmonary process. 2. Chronic bronchitic markings.   Electronically Signed   By: Suzy Bouchard M.D.   On: 09/06/2014 17:43   Ct Angio Chest Pe W/cm &/or Wo Cm  09/06/2014   CLINICAL DATA:  Evaluate for PE, fever and  weakness  EXAM: CT ANGIOGRAPHY CHEST WITH CONTRAST  TECHNIQUE: Multidetector CT imaging of the chest was performed using the standard protocol during bolus administration of intravenous contrast. Multiplanar CT image reconstructions and MIPs were obtained to evaluate the vascular anatomy.  CONTRAST:  179mL OMNIPAQUE IOHEXOL 350 MG/ML SOLN  COMPARISON:  Chest radiograph 09/06/2014  FINDINGS: There are no filling defects within pulmonary arteries to suggest acute pulmonary embolism. No acute findings aorta great vessels. No pericardial fluid. Esophagus is normal.  Review of the lung parenchyma demonstrates Owens lobular septal thickening. There is Owens sub solid nodule in the right lower lobe measuring 4 mm (image 60, series 7).  No axillary or supraclavicular adenopathy. No mediastinal adenopathy.  Limited upper abdomen is unremarkable. No aggressive osseous lesion.  Review of the MIP images confirms the above findings.  IMPRESSION: 1. No evidence of acute pulmonary embolism. 2. Mild interlobular septal thickening suggests mild interstitial edema. 3. Small sub solid solid nodule in the right lower lobe likely represent small focus of infection. Recommend follow-up CT without contrast in 3 months to determine if this lesion persists.   Electronically Signed   By: Suzy Bouchard M.D.   On: 09/06/2014 20:09   Dg Abd Portable 1v  09/11/2014   CLINICAL DATA:  73 year old female with nausea vomiting diarrhea. Initial encounter.  EXAM: PORTABLE ABDOMEN - 1 VIEW  COMPARISON:  Chest CTA 09/06/2014.  Pelvis radiographs 04/16/2006.  FINDINGS: Portable AP supine view at 0908 hrs. Moderate levoconvex lumbar scoliosis. The upper abdomen is not entirely included. The level of the diaphragm is not included. Non obstructed bowel gas pattern. Abdominal and pelvic visceral contours are within normal limits. No definite pneumoperitoneum on this supine view. No acute osseous abnormality identified.  IMPRESSION: Negative supine KUB view of  the abdomen.   Electronically Signed   By: Lars Pinks M.D.   On: 09/11/2014 10:14   Microbiology: Recent Results (from the past 240 hour(s))  Blood Culture (routine x 2)     Status: None (Preliminary result)   Collection Time: 09/06/14  5:49 PM  Result Value Ref Range Status   Specimen Description BLOOD BLOOD LEFT FOREARM  Final   Special Requests BOTTLES DRAWN AEROBIC AND ANAEROBIC 5 CC EACH  Final   Culture  Setup Time   Final    09/06/2014 21:11 Performed at [facility]    Culture   Final           BLOOD CULTURE RECEIVED NO GROWTH TO DATE CULTURE WILL BE HELD FOR 5 DAYS BEFORE ISSUING Owens FINAL NEGATIVE REPORT Performed at [facility]    Report Status PENDING  Incomplete  Blood Culture (routine x 2)     Status: None (Preliminary result)   Collection Time: 09/06/14  5:49 PM  Result Value Ref Range Status   Specimen Description BLOOD LEFT HAND  Final   Special Requests BOTTLES DRAWN AEROBIC ONLY 3CC   Final   Culture  Setup Time   Final    09/06/2014 21:11 Performed at [facility]    Culture   Final           BLOOD CULTURE RECEIVED NO GROWTH TO DATE CULTURE WILL BE HELD FOR 5 DAYS BEFORE ISSUING Owens FINAL NEGATIVE REPORT Performed at [facility]    Report Status PENDING  Incomplete  Urine culture     Status: None   Collection Time: 09/06/14  7:43 PM  Result Value Ref Range Status   Specimen Description URINE, CLEAN CATCH  Final   Special Requests NONE  Final   Culture  Setup Time   Final    09/07/2014 01:15 Performed at [place] Performed at [facility]   Final   Culture NO GROWTH Performed at [facility]   Final   Report Status 09/08/2014 FINAL  Final     Labs: Basic Metabolic Panel:  Recent Labs Lab 09/06/14 1749 09/07/14 0503 09/08/14 0535 09/09/14 0500 09/10/14 0526  NA 138 139 137 140 138  K 3.8 4.2 4.3 4.3 3.9  CL 103 108 104 104 104  CO2 22 22 22 22 22    GLUCOSE 107* 141* 160* 98 95  BUN 22 19 16 19 12   CREATININE 1.21* 1.16* 0.99 1.11* 0.99  CALCIUM 8.8 8.4 9.1 9.1 8.7   Liver Function Tests:  Recent Labs Lab 09/06/14 1749 09/08/14 0535 09/09/14 0500  AST 42* 43* 38*  ALT 40* 47* 40*  ALKPHOS 54 56 51  BILITOT 0.5 0.4 0.4  PROT 6.1 6.1 5.9*  ALBUMIN 3.2* 3.0* 3.0*   CBC:  Recent Labs Lab 09/06/14 1749 09/07/14 0503 09/08/14 0535 09/09/14 0500 09/10/14 0526  WBC 18.3* 12.6* 9.9 2.9* 1.3*  NEUTROABS 17.5*  --  9.3* 2.0 0.2*  HGB 11.6* 10.6*  10.7* 10.7* 10.3*  HCT 34.9* 31.5* 31.5* 31.8* 30.2*  MCV 95.4 94.6 93.8 94.6 92.4  PLT 149* 137* 108* 80* 79*    Principal Problem:   HCAP (healthcare-associated pneumonia) Active Problems:   Breast cancer of upper-inner quadrant of right female breast   Leukocytosis   Lung nodule, solitary   Thrombocytopenia   Time coordinating discharge: 35 minutes, discussed with husband, son, daughter-in-law at bedside  Signed:  Murray Hodgkins, Owens [facility] 09/11/2014, 3:51 PM

## 2014-09-11 NOTE — Plan of Care (Signed)
Problem: Discharge Progression Outcomes Goal: Discharge plan in place and appropriate Outcome: Completed/Met Date Met:  09/11/14 Goal: Pain controlled with appropriate interventions Outcome: Completed/Met Date Met:  09/11/14 Goal: Hemodynamically stable Outcome: Completed/Met Date Met:  09/11/14 Goal: Complications resolved/controlled Outcome: Completed/Met Date Met:  09/11/14 Goal: Tolerating diet Outcome: Completed/Met Date Met:  09/11/14 Goal: Activity appropriate for discharge plan Outcome: Completed/Met Date Met:  09/11/14 Goal: Other Discharge Outcomes/Goals Outcome: Completed/Met Date Met:  09/11/14     

## 2014-09-11 NOTE — Progress Notes (Signed)
PROGRESS NOTE  Kelli Owens OEH:[id number] DOB: 10-13-1941 DOA: 09/06/2014 PCP: Jerlyn Ly, MD Oncologist Dr. Pamala Duffel.  Summary: 73 year old woman with breast cancer, started chemotherapy approximately 2 days prior to admission, presented with fever, chills and cough. Admitted for right lower lobe pneumonia, sepsis  Assessment/Plan: 1. Pneumonia, treated as HCAP because of immunocompromise. No reason to suspect MRSA. Appears clinically resolved. 2. Neutropenia, thrombocytopenia secondary to chemotherapy, no further evaluation per oncology. Counts expected to spontaneously improve soon per oncology. 3. Nausea without vomiting. Tolerating diet. No abdominal pain. Plain film of the abdomen unremarkable. Likely related to chemotherapy. She has nausea medication at home. She tolerated lunch. She requested discharge home. Discussed her options with family present and she desired discharge. She knows to return for recurrent vomiting or worsening of condition. 4. Acute renal failure secondary to acute illness, resolved. 5. Breast cancer, status post chemotherapy 11/19  6. Elevated transaminases, less than 2 times normal, unclear etiology. Follow-up as an outpatient. Possibly related to chemotherapy. 7. Lung nodule. Follow-up as an outpatient.  8. Depression, anxiety, acid reflux   Afebrile for 96 hours. Blood cultures remain no growth to date, urine culture unremarkable.   Plan home on oral antibiotics.    Code Status: full code DVT prophylaxis: SCDs Family Communication: discussed with husband at beside Disposition Plan:   Murray Hodgkins, MD  [facility]  Pager [phone] If 7PM-7AM, please contact night-coverage at [email or web address], password [facility] 09/11/2014, 12:00 PM  LOS: 5 days   Consultants:  Oncology   Procedures:    Antibiotics: Vancomycin 11/21 >> 11/25 Cefepime 11/21 >> 11/25 Ceftin 11/26 >>  HPI/Subjective: Nursing reports nausea this AM.  She had  nausea this morning however currently feeling better. She was able to tolerate lunch. No vomiting. She has no abdominal pain. Breathing well. No complaints. She would like to go home.  Objective: Filed Vitals:   09/10/14 1956 09/11/14 0159 09/11/14 0550 09/11/14 1009  BP: 115/55 121/52 107/48 112/53  Pulse: 83 84 75 70  Temp: 98.7 F (37.1 C) 98.9 F (37.2 C) 98 F (36.7 C) 97.5 F (36.4 C)  TempSrc: Oral Oral Oral Oral  Resp: 18 18 18 18   Height:      Weight:      SpO2: 99% 97% 97% 99%    Intake/Output Summary (Last 24 hours) at 09/11/14 1200 Last data filed at 09/11/14 1009  Gross per 24 hour  Intake    120 ml  Output      0 ml  Net    120 ml     Filed Weights   09/06/14 1800 09/06/14 2253  Weight: 77.565 kg (171 lb) 79.107 kg (174 lb 6.4 oz)    Exam:     Remains afebrile, vital signs stable. No hypoxia.   Appears calm, comfortable. Speech fluent and clear. Alert.  Respiratory clear to auscultation bilaterally. No wheezes, rales or rhonchi. Normal respiratory effort.  Cardiovascular regular rate and rhythm. No murmur, rub or gallop.  Abdomen soft, nontender, nondistended. Positive bowel sounds.  Data Reviewed:  Multiple voids. BM 2.   Urine culture no growth, final; blood cultures remain no growth to date   Abdominal film negative.   Scheduled Meds: . cefUROXime  500 mg Oral BID WC  . [START ON 09/15/2014] cyanocobalamin  1,000 mcg Intramuscular Q30 days  . docusate sodium  100 mg Oral BID  . multivitamin with minerals  1 tablet Oral Daily  . valACYclovir  500 mg Oral BID   Continuous Infusions:  Principal Problem:   HCAP (healthcare-associated pneumonia) Active Problems:   Breast cancer of upper-inner quadrant of right female breast   Leukocytosis   Lung nodule, solitary   Thrombocytopenia

## 2014-09-12 LAB — CULTURE, BLOOD (ROUTINE X 2)
CULTURE: NO GROWTH
Culture: NO GROWTH

## 2014-09-22 ENCOUNTER — Telehealth: Payer: Self-pay | Admitting: *Deleted

## 2014-09-22 ENCOUNTER — Telehealth: Payer: Self-pay | Admitting: Hematology and Oncology

## 2014-09-22 NOTE — Telephone Encounter (Signed)
Per staff message and POF I have scheduled appts. Advised scheduler of appts. JMW  

## 2014-09-22 NOTE — Telephone Encounter (Signed)
Confirm appt d/t for  09/25/14.

## 2014-09-25 ENCOUNTER — Other Ambulatory Visit: Payer: Self-pay | Admitting: Hematology and Oncology

## 2014-09-25 ENCOUNTER — Ambulatory Visit (HOSPITAL_BASED_OUTPATIENT_CLINIC_OR_DEPARTMENT_OTHER): Payer: Medicare Other | Admitting: Hematology and Oncology

## 2014-09-25 ENCOUNTER — Other Ambulatory Visit (HOSPITAL_BASED_OUTPATIENT_CLINIC_OR_DEPARTMENT_OTHER): Payer: Medicare Other

## 2014-09-25 ENCOUNTER — Ambulatory Visit (HOSPITAL_BASED_OUTPATIENT_CLINIC_OR_DEPARTMENT_OTHER): Payer: Medicare Other

## 2014-09-25 ENCOUNTER — Other Ambulatory Visit: Payer: Self-pay | Admitting: *Deleted

## 2014-09-25 ENCOUNTER — Telehealth: Payer: Self-pay | Admitting: Hematology and Oncology

## 2014-09-25 VITALS — BP 140/69 | HR 80 | Temp 98.0°F | Resp 18 | Ht 61.0 in | Wt 159.7 lb

## 2014-09-25 DIAGNOSIS — C50811 Malignant neoplasm of overlapping sites of right female breast: Secondary | ICD-10-CM

## 2014-09-25 DIAGNOSIS — C50211 Malignant neoplasm of upper-inner quadrant of right female breast: Secondary | ICD-10-CM

## 2014-09-25 DIAGNOSIS — Z5112 Encounter for antineoplastic immunotherapy: Secondary | ICD-10-CM

## 2014-09-25 DIAGNOSIS — R5383 Other fatigue: Secondary | ICD-10-CM

## 2014-09-25 DIAGNOSIS — R630 Anorexia: Secondary | ICD-10-CM

## 2014-09-25 DIAGNOSIS — D6481 Anemia due to antineoplastic chemotherapy: Secondary | ICD-10-CM

## 2014-09-25 DIAGNOSIS — D702 Other drug-induced agranulocytosis: Secondary | ICD-10-CM

## 2014-09-25 DIAGNOSIS — Z5111 Encounter for antineoplastic chemotherapy: Secondary | ICD-10-CM

## 2014-09-25 DIAGNOSIS — D6959 Other secondary thrombocytopenia: Secondary | ICD-10-CM

## 2014-09-25 LAB — CBC WITH DIFFERENTIAL/PLATELET
BASO%: 0.7 % (ref 0.0–2.0)
Basophils Absolute: 0 10*3/uL (ref 0.0–0.1)
EOS%: 0 % (ref 0.0–7.0)
Eosinophils Absolute: 0 10*3/uL (ref 0.0–0.5)
HCT: 31.7 % — ABNORMAL LOW (ref 34.8–46.6)
HGB: 10.4 g/dL — ABNORMAL LOW (ref 11.6–15.9)
LYMPH#: 0.7 10*3/uL — AB (ref 0.9–3.3)
LYMPH%: 15 % (ref 14.0–49.7)
MCH: 31.3 pg (ref 25.1–34.0)
MCHC: 32.8 g/dL (ref 31.5–36.0)
MCV: 95.4 fL (ref 79.5–101.0)
MONO#: 0.4 10*3/uL (ref 0.1–0.9)
MONO%: 7.5 % (ref 0.0–14.0)
NEUT#: 3.8 10*3/uL (ref 1.5–6.5)
NEUT%: 76.8 % (ref 38.4–76.8)
Platelets: 333 10*3/uL (ref 145–400)
RBC: 3.32 10*6/uL — ABNORMAL LOW (ref 3.70–5.45)
RDW: 13 % (ref 11.2–14.5)
WBC: 5 10*3/uL (ref 3.9–10.3)

## 2014-09-25 LAB — COMPREHENSIVE METABOLIC PANEL (CC13)
ALT: 30 U/L (ref 0–55)
ANION GAP: 11 meq/L (ref 3–11)
AST: 22 U/L (ref 5–34)
Albumin: 3.7 g/dL (ref 3.5–5.0)
Alkaline Phosphatase: 71 U/L (ref 40–150)
BILIRUBIN TOTAL: 0.38 mg/dL (ref 0.20–1.20)
BUN: 17.3 mg/dL (ref 7.0–26.0)
CHLORIDE: 104 meq/L (ref 98–109)
CO2: 26 meq/L (ref 22–29)
Calcium: 10.2 mg/dL (ref 8.4–10.4)
Creatinine: 1.2 mg/dL — ABNORMAL HIGH (ref 0.6–1.1)
EGFR: 43 mL/min/{1.73_m2} — ABNORMAL LOW (ref >90)
Glucose: 112 mg/dl (ref 70–140)
Potassium: 4.2 mEq/L (ref 3.5–5.1)
Sodium: 141 mEq/L (ref 136–145)
TOTAL PROTEIN: 6.9 g/dL (ref 6.4–8.3)

## 2014-09-25 MED ORDER — DEXAMETHASONE SODIUM PHOSPHATE 20 MG/5ML IJ SOLN
20.0000 mg | Freq: Once | INTRAMUSCULAR | Status: AC
  Administered 2014-09-25: 20 mg via INTRAVENOUS

## 2014-09-25 MED ORDER — ACETAMINOPHEN 325 MG PO TABS
650.0000 mg | ORAL_TABLET | Freq: Once | ORAL | Status: AC
  Administered 2014-09-25: 650 mg via ORAL

## 2014-09-25 MED ORDER — ONDANSETRON 16 MG/50ML IVPB (CHCC)
16.0000 mg | Freq: Once | INTRAVENOUS | Status: AC
  Administered 2014-09-25: 16 mg via INTRAVENOUS

## 2014-09-25 MED ORDER — DIPHENHYDRAMINE HCL 25 MG PO CAPS
50.0000 mg | ORAL_CAPSULE | Freq: Once | ORAL | Status: AC
  Administered 2014-09-25: 50 mg via ORAL

## 2014-09-25 MED ORDER — SODIUM CHLORIDE 0.9 % IJ SOLN
10.0000 mL | INTRAMUSCULAR | Status: DC | PRN
  Administered 2014-09-25: 10 mL
  Filled 2014-09-25: qty 10

## 2014-09-25 MED ORDER — SODIUM CHLORIDE 0.9 % IV SOLN
420.0000 mg | Freq: Once | INTRAVENOUS | Status: AC
  Administered 2014-09-25: 420 mg via INTRAVENOUS
  Filled 2014-09-25: qty 14

## 2014-09-25 MED ORDER — ACETAMINOPHEN 325 MG PO TABS
ORAL_TABLET | ORAL | Status: AC
  Filled 2014-09-25: qty 2

## 2014-09-25 MED ORDER — DEXAMETHASONE SODIUM PHOSPHATE 20 MG/5ML IJ SOLN
INTRAMUSCULAR | Status: AC
  Filled 2014-09-25: qty 5

## 2014-09-25 MED ORDER — ONDANSETRON 16 MG/50ML IVPB (CHCC)
INTRAVENOUS | Status: AC
  Filled 2014-09-25: qty 16

## 2014-09-25 MED ORDER — DOCETAXEL CHEMO INJECTION 160 MG/16ML
65.0000 mg/m2 | Freq: Once | INTRAVENOUS | Status: AC
  Administered 2014-09-25: 120 mg via INTRAVENOUS
  Filled 2014-09-25: qty 12

## 2014-09-25 MED ORDER — HEPARIN SOD (PORK) LOCK FLUSH 100 UNIT/ML IV SOLN
500.0000 [IU] | Freq: Once | INTRAVENOUS | Status: AC | PRN
  Administered 2014-09-25: 500 [IU]
  Filled 2014-09-25: qty 5

## 2014-09-25 MED ORDER — DIPHENHYDRAMINE HCL 25 MG PO CAPS
ORAL_CAPSULE | ORAL | Status: AC
  Filled 2014-09-25: qty 2

## 2014-09-25 MED ORDER — SODIUM CHLORIDE 0.9 % IV SOLN
6.0000 mg/kg | Freq: Once | INTRAVENOUS | Status: AC
  Administered 2014-09-25: 462 mg via INTRAVENOUS
  Filled 2014-09-25: qty 22

## 2014-09-25 MED ORDER — SODIUM CHLORIDE 0.9 % IV SOLN
Freq: Once | INTRAVENOUS | Status: AC
  Administered 2014-09-25: 11:00:00 via INTRAVENOUS

## 2014-09-25 NOTE — Telephone Encounter (Signed)
, °

## 2014-09-25 NOTE — Patient Instructions (Signed)
[facility] Discharge Instructions for Patients Receiving Chemotherapy  Today you received the following chemotherapy agents: Taxotere, Herceptin, Perjeta  To help prevent nausea and vomiting after your treatment, we encourage you to take your nausea medication: Compazine 10 mg every 6 hours as needed, Zofran 8 mg every 12 hours. As needed.   If you develop nausea and vomiting that is not controlled by your nausea medication, call the clinic.   BELOW ARE SYMPTOMS THAT SHOULD BE REPORTED IMMEDIATELY:  *FEVER GREATER THAN 100.5 F  *CHILLS WITH OR WITHOUT FEVER  NAUSEA AND VOMITING THAT IS NOT CONTROLLED WITH YOUR NAUSEA MEDICATION  *UNUSUAL SHORTNESS OF BREATH  *UNUSUAL BRUISING OR BLEEDING  TENDERNESS IN MOUTH AND THROAT WITH OR WITHOUT PRESENCE OF ULCERS  *URINARY PROBLEMS  *BOWEL PROBLEMS  UNUSUAL RASH Items with * indicate a potential emergency and should be followed up as soon as possible.  Feel free to call the clinic you have any questions or concerns. The clinic phone number is [phone].

## 2014-09-25 NOTE — Progress Notes (Signed)
Patient Care Team: Jerlyn Ly, MD as PCP - General (Internal Medicine) Alphonsa Overall, MD as Consulting Physician (General Surgery) Rulon Eisenmenger, MD as Consulting Physician (Hematology and Oncology) Blair Promise, MD as Consulting Physician (Radiation Oncology)  DIAGNOSIS: Breast cancer of upper-inner quadrant of right female breast   Staging form: Breast, AJCC 7th Edition     Clinical: Stage IIA (T2, N0, cM0) - Signed by Rulon Eisenmenger, MD on 08/13/2014     Pathologic: No stage assigned - Unsigned   SUMMARY OF ONCOLOGIC HISTORY:   Breast cancer of upper-inner quadrant of right female breast   07/17/2014 Mammogram Right breast mass at 3:00 position by ultrasound measured 2.4 cm   08/06/2014 Initial Biopsy 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 After cycle 1 she was hospitalized with fevers and was discharged home on antibiotics. She is here today for cycle 2. She reports profound lack of energy, weight loss, loss of appetite.  REVIEW OF SYSTEMS:   Constitutional: Denies fevers, chills or abnormal weight loss Eyes: Denies blurriness of vision Ears, nose, mouth, throat, and face: Denies mucositis or sore throat Respiratory: Denies cough, dyspnea or wheezes Cardiovascular: Denies palpitation, chest discomfort or lower extremity swelling Gastrointestinal:  Denies  nausea, heartburn or change in bowel habits Skin: Denies abnormal skin rashes Lymphatics: Denies new lymphadenopathy or easy bruising Neurological:Denies numbness, tingling or new weaknesses Behavioral/Psych: Mood is stable, no new changes  All other systems were reviewed with the patient and are negative.  I have reviewed the past medical history, past surgical history, social history and family history with the patient and they are unchanged from previous note.  ALLERGIES:  has No Known Allergies.  MEDICATIONS:  Current Outpatient Prescriptions  Medication Sig Dispense Refill  . cefUROXime (CEFTIN) 500 MG tablet Take 1 tablet (500 mg total) by mouth 2 (two) times daily with a meal. 5 tablet 0  . cyanocobalamin (,VITAMIN B-12,) 1000 MCG/ML injection Inject 1,000 mcg into the muscle every 30 (thirty) days.    Marland Kitchen dexamethasone (DECADRON) 4 MG tablet Take 1 tablet (4 mg total) by mouth 2 (two) times daily. Start the day before Taxotere. Then again the day after chemo for 3 days. 30 tablet 1  . HYDROcodone-acetaminophen (NORCO/VICODIN) 5-325 MG per tablet Take 1-2 tablets by mouth every 6 (six) hours as needed. 30 tablet 0  . lidocaine-prilocaine (EMLA) cream Apply 1 application topically as needed. 30 g 0  . LORazepam (ATIVAN) 0.5 MG tablet Take 0.5 mg by mouth every 6 (six) hours as needed for anxiety.    Marland Kitchen LYRICA 75 MG capsule Take 75 mg by mouth 3 (three) times daily as needed (for pain).   0  . Multiple Vitamin (MULTIVITAMIN WITH MINERALS) TABS tablet Take 1 tablet by mouth daily.    . ondansetron (ZOFRAN) 8 MG tablet Take 1 tablet (8 mg total) by mouth 2 (two) times daily. Start the day after chemo for 3  days. Then take as needed for nausea or vomiting. 30 tablet 1  . prochlorperazine (COMPAZINE) 10 MG tablet   0  . temazepam (RESTORIL) 30 MG capsule Take 30 mg by mouth at bedtime as needed for sleep.    . Vitamin D, Ergocalciferol, (DRISDOL) 50000 UNITS CAPS Take 50,000 Units by mouth  every 7 (seven) days. Takes on Thursday or Friday     No current facility-administered medications for this visit.   Facility-Administered Medications Ordered in Other Visits  Medication Dose Route Frequency Provider Last Rate Last Dose  . sodium chloride 0.9 % injection 10 mL  10 mL Intracatheter PRN Rulon Eisenmenger, MD   10 mL at 09/04/14 1601    PHYSICAL EXAMINATION: ECOG PERFORMANCE STATUS: 2 - Symptomatic, <50% confined to bed  Filed Vitals:   09/25/14 0935  BP: 140/69  Pulse: 80  Temp: 98 F (36.7 C)  Resp: 18   Filed Weights   09/25/14 0935  Weight: 159 lb 11.2 oz (72.439 kg)    GENERAL:alert, no distress and comfortable SKIN: skin color, texture, turgor are normal, no rashes or significant lesions EYES: normal, Conjunctiva are pink and non-injected, sclera clear OROPHARYNX:no exudate, no erythema and lips, buccal mucosa, and tongue normal  NECK: supple, thyroid normal size, non-tender, without nodularity LYMPH:  no palpable lymphadenopathy in the cervical, axillary or inguinal LUNGS: clear to auscultation and percussion with normal breathing effort HEART: regular rate & rhythm and no murmurs and no lower extremity edema ABDOMEN:abdomen soft, non-tender and normal bowel sounds Musculoskeletal:no cyanosis of digits and no clubbing  NEURO: alert & oriented x 3 with fluent speech, no focal motor/sensory deficits  LABORATORY DATA:  I have reviewed the data as listed   Chemistry      Component Value Date/Time   NA 138 09/10/2014 0526   NA 143 08/13/2014 0820   K 3.9 09/10/2014 0526   K 3.9 08/13/2014 0820   CL 104 09/10/2014 0526   CO2 22 09/10/2014 0526   CO2 23 08/13/2014 0820   BUN 12 09/10/2014 0526   BUN 16.5 08/13/2014 0820   CREATININE 0.99 09/10/2014 0526   CREATININE 0.9 08/13/2014 0820      Component Value Date/Time   CALCIUM 8.7 09/10/2014 0526   CALCIUM 9.7 08/13/2014 0820   ALKPHOS 51 09/09/2014 0500   ALKPHOS 40 08/13/2014 0820   AST 38*  09/09/2014 0500   AST 15 08/13/2014 0820   ALT 40* 09/09/2014 0500   ALT 20 08/13/2014 0820   BILITOT 0.4 09/09/2014 0500   BILITOT 0.37 08/13/2014 0820       Lab Results  Component Value Date   WBC 5.0 09/25/2014   HGB 10.4* 09/25/2014   HCT 31.7* 09/25/2014   MCV 95.4 09/25/2014   PLT 333 09/25/2014   NEUTROABS 3.8 09/25/2014   ASSESSMENT & PLAN:  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 Hospitalization for pneumonia after cycle 1 ( patient had fevers up 102 and was found to have slight infiltrate on chest x-ray. Unclear if the fever was related to a true pneumonia versus Neulasta related pyrexia) 2. Chemotherapy-induced neutropenia 3. Chemotherapy-induced anemia 4. Chemotherapy-induced thrombocytopenia 5. Profound fatigue 5. Diminished appetite  Plan: discontinue carboplatin and reduction in dosage of Taxotere to 65 mg  meter square. Return to clinic in one week for toxicity check and follow monitoring closely for toxicities  Orders Placed This Encounter  Procedures  . CBC with Differential    Standing Status: Future     Number of Occurrences:      Standing Expiration Date: 09/25/2015  . Comprehensive metabolic panel (Cmet) - [facility]    Standing Status: Future     Number of Occurrences:      Standing Expiration Date: 09/25/2015   The patient has a good understanding of the overall plan. she agrees with it. She will call with any problems that may develop before her next visit here.   Rulon Eisenmenger, MD 09/25/2014 10:04 AM

## 2014-09-25 NOTE — Progress Notes (Signed)
Taxotere given first per pharmacy.

## 2014-09-25 NOTE — Assessment & Plan Note (Addendum)
Right breast invasive mammary cancer most likely ductal phenotype grade 3, 2.4 cm mass at 3:00 position ER negative PR negative HER-2 positive ratio 2.75, Ki-67 80% clinical stage IIA, T2, N0, M0  Toxicities of chemotherapy: 1. Hospitalization for pneumonia after cycle 1 ( patient had fevers up 102 and was found to have slight infiltrate on chest x-ray. Unclear if the fever was related to a true pneumonia versus Neulasta related pyrexia) 2. Chemotherapy-induced neutropenia 3. Chemotherapy-induced anemia 4. Chemotherapy-induced thrombocytopenia 5. Profound fatigue 5. Diminished appetite  Plan: discontinue carboplatin and reduction in dosage of Taxotere to 65 mg meter square. Return to clinic in one week for toxicity check and follow

## 2014-09-26 ENCOUNTER — Ambulatory Visit (HOSPITAL_BASED_OUTPATIENT_CLINIC_OR_DEPARTMENT_OTHER): Payer: Medicare Other

## 2014-09-26 DIAGNOSIS — C50811 Malignant neoplasm of overlapping sites of right female breast: Secondary | ICD-10-CM

## 2014-09-26 DIAGNOSIS — Z5189 Encounter for other specified aftercare: Secondary | ICD-10-CM

## 2014-09-26 DIAGNOSIS — C50211 Malignant neoplasm of upper-inner quadrant of right female breast: Secondary | ICD-10-CM

## 2014-09-26 MED ORDER — PEGFILGRASTIM INJECTION 6 MG/0.6ML ~~LOC~~
6.0000 mg | PREFILLED_SYRINGE | Freq: Once | SUBCUTANEOUS | Status: AC
  Administered 2014-09-26: 6 mg via SUBCUTANEOUS
  Filled 2014-09-26: qty 0.6

## 2014-09-26 NOTE — Patient Instructions (Signed)
Pegfilgrastim injection What is this medicine? PEGFILGRASTIM (peg fil GRA stim) is a long-acting granulocyte colony-stimulating factor that stimulates the growth of neutrophils, a type of white blood cell important in the body's fight against infection. It is used to reduce the incidence of fever and infection in patients with certain types of cancer who are receiving chemotherapy that affects the bone marrow. This medicine may be used for other purposes; ask your health care provider or pharmacist if you have questions. COMMON BRAND NAME(S): Neulasta What should I tell my health care provider before I take this medicine? They need to know if you have any of these conditions: -latex allergy -ongoing radiation therapy -sickle cell disease -skin reactions to acrylic adhesives (On-Body Injector only) -an unusual or allergic reaction to pegfilgrastim, filgrastim, other medicines, foods, dyes, or preservatives -pregnant or trying to get pregnant -breast-feeding How should I use this medicine? This medicine is for injection under the skin. If you get this medicine at home, you will be taught how to prepare and give the pre-filled syringe or how to use the On-body Injector. Refer to the patient Instructions for Use for detailed instructions. Use exactly as directed. Take your medicine at regular intervals. Do not take your medicine more often than directed. It is important that you put your used needles and syringes in a special sharps container. Do not put them in a trash can. If you do not have a sharps container, call your pharmacist or healthcare provider to get one. Talk to your pediatrician regarding the use of this medicine in children. Special care may be needed. Overdosage: If you think you have taken too much of this medicine contact a poison control center or emergency room at once. NOTE: This medicine is only for you. Do not share this medicine with others. What if I miss a dose? It is  important not to miss your dose. Call your doctor or health care professional if you miss your dose. If you miss a dose due to an On-body Injector failure or leakage, a new dose should be administered as soon as possible using a single prefilled syringe for manual use. What may interact with this medicine? Interactions have not been studied. Give your health care provider a list of all the medicines, herbs, non-prescription drugs, or dietary supplements you use. Also tell them if you smoke, drink alcohol, or use illegal drugs. Some items may interact with your medicine. This list may not describe all possible interactions. Give your health care provider a list of all the medicines, herbs, non-prescription drugs, or dietary supplements you use. Also tell them if you smoke, drink alcohol, or use illegal drugs. Some items may interact with your medicine. What should I watch for while using this medicine? You may need blood work done while you are taking this medicine. If you are going to need a MRI, CT scan, or other procedure, tell your doctor that you are using this medicine (On-Body Injector only). What side effects may I notice from receiving this medicine? Side effects that you should report to your doctor or health care professional as soon as possible: -allergic reactions like skin rash, itching or hives, swelling of the face, lips, or tongue -dizziness -fever -pain, redness, or irritation at site where injected -pinpoint red spots on the skin -shortness of breath or breathing problems -stomach or side pain, or pain at the shoulder -swelling -tiredness -trouble passing urine Side effects that usually do not require medical attention (report to your doctor   or health care professional if they continue or are bothersome): -bone pain -muscle pain This list may not describe all possible side effects. Call your doctor for medical advice about side effects. You may report side effects to FDA at  1-800-FDA-1088. Where should I keep my medicine? Keep out of the reach of children. Store pre-filled syringes in a refrigerator between 2 and 8 degrees C (36 and 46 degrees F). Do not freeze. Keep in carton to protect from light. Throw away this medicine if it is left out of the refrigerator for more than 48 hours. Throw away any unused medicine after the expiration date. NOTE: This sheet is a summary. It may not cover all possible information. If you have questions about this medicine, talk to your doctor, pharmacist, or health care provider.  2015, [employer]. (2014-01-02 16:14:05)  

## 2014-10-01 ENCOUNTER — Telehealth: Payer: Self-pay | Admitting: Hematology and Oncology

## 2014-10-01 ENCOUNTER — Encounter: Payer: Self-pay | Admitting: Oncology

## 2014-10-01 ENCOUNTER — Other Ambulatory Visit (HOSPITAL_BASED_OUTPATIENT_CLINIC_OR_DEPARTMENT_OTHER): Payer: Medicare Other

## 2014-10-01 ENCOUNTER — Ambulatory Visit (HOSPITAL_BASED_OUTPATIENT_CLINIC_OR_DEPARTMENT_OTHER): Payer: Medicare Other | Admitting: Oncology

## 2014-10-01 VITALS — BP 148/60 | HR 90 | Temp 97.4°F | Resp 18 | Ht 61.0 in | Wt 160.7 lb

## 2014-10-01 DIAGNOSIS — C50211 Malignant neoplasm of upper-inner quadrant of right female breast: Secondary | ICD-10-CM

## 2014-10-01 DIAGNOSIS — D6481 Anemia due to antineoplastic chemotherapy: Secondary | ICD-10-CM

## 2014-10-01 DIAGNOSIS — C50811 Malignant neoplasm of overlapping sites of right female breast: Secondary | ICD-10-CM

## 2014-10-01 DIAGNOSIS — K219 Gastro-esophageal reflux disease without esophagitis: Secondary | ICD-10-CM

## 2014-10-01 DIAGNOSIS — R110 Nausea: Secondary | ICD-10-CM

## 2014-10-01 DIAGNOSIS — R5383 Other fatigue: Secondary | ICD-10-CM

## 2014-10-01 LAB — CBC WITH DIFFERENTIAL/PLATELET
BASO%: 0.4 % (ref 0.0–2.0)
BASOS ABS: 0.1 10*3/uL (ref 0.0–0.1)
EOS%: 0.2 % (ref 0.0–7.0)
Eosinophils Absolute: 0.1 10*3/uL (ref 0.0–0.5)
HCT: 30.1 % — ABNORMAL LOW (ref 34.8–46.6)
HEMOGLOBIN: 9.9 g/dL — AB (ref 11.6–15.9)
LYMPH%: 12.4 % — AB (ref 14.0–49.7)
MCH: 31.3 pg (ref 25.1–34.0)
MCHC: 32.8 g/dL (ref 31.5–36.0)
MCV: 95.5 fL (ref 79.5–101.0)
MONO#: 2.5 10*3/uL — ABNORMAL HIGH (ref 0.1–0.9)
MONO%: 10.9 % (ref 0.0–14.0)
NEUT#: 17.7 10*3/uL — ABNORMAL HIGH (ref 1.5–6.5)
NEUT%: 76.1 % (ref 38.4–76.8)
Platelets: 219 10*3/uL (ref 145–400)
RBC: 3.15 10*6/uL — ABNORMAL LOW (ref 3.70–5.45)
RDW: 13.8 % (ref 11.2–14.5)
WBC: 23.3 10*3/uL — ABNORMAL HIGH (ref 3.9–10.3)
lymph#: 2.9 10*3/uL (ref 0.9–3.3)

## 2014-10-01 LAB — COMPREHENSIVE METABOLIC PANEL (CC13)
ALK PHOS: 102 U/L (ref 40–150)
ALT: 19 U/L (ref 0–55)
AST: 30 U/L (ref 5–34)
Albumin: 3.4 g/dL — ABNORMAL LOW (ref 3.5–5.0)
Anion Gap: 8 mEq/L (ref 3–11)
BILIRUBIN TOTAL: 0.35 mg/dL (ref 0.20–1.20)
BUN: 12.9 mg/dL (ref 7.0–26.0)
CO2: 27 mEq/L (ref 22–29)
CREATININE: 1 mg/dL (ref 0.6–1.1)
Calcium: 9.8 mg/dL (ref 8.4–10.4)
Chloride: 107 mEq/L (ref 98–109)
EGFR: 58 mL/min/{1.73_m2} — ABNORMAL LOW (ref >90)
Glucose: 103 mg/dl (ref 70–140)
Potassium: 4 mEq/L (ref 3.5–5.1)
Sodium: 141 mEq/L (ref 136–145)
Total Protein: 6.2 g/dL — ABNORMAL LOW (ref 6.4–8.3)

## 2014-10-01 MED ORDER — ONDANSETRON HCL 8 MG PO TABS: 8.0000 mg | ORAL_TABLET | Freq: Three times a day (TID) | ORAL | Status: DC | PRN

## 2014-10-01 MED ORDER — LORAZEPAM 0.5 MG PO TABS: 0.5000 mg | ORAL_TABLET | Freq: Three times a day (TID) | ORAL | Status: DC | PRN

## 2014-10-01 MED ORDER — OMEPRAZOLE 20 MG PO CPDR: 20.0000 mg | DELAYED_RELEASE_CAPSULE | Freq: Every day | ORAL | Status: DC

## 2014-10-01 NOTE — Assessment & Plan Note (Addendum)
Right breast invasive mammary cancer most likely ductal phenotype grade 3, 2.4 cm mass at 3:00 position ER negative PR negative HER-2 positive ratio 2.75, Ki-67 80% clinical stage IIA, T2, N0, M0  Toxicities of chemotherapy: 1. Fatigue related to chemotherapy. Able to perform ADLs, but has not been getting our of her house often.  2. Anemia due to chemotherapy. 3. Nausea 4. GERD  Plan: Recommended taking frequent breaks and resting when she is able. Reviewed antiemetic use and how to take her medications. I have recommended that she use Zofran more frequently when she feels nauseated and to use Lorazepam or Compazine in addition to the Zofran. I have refilled her Zofran and Ativan today. For her GERD symptoms, I have given her a prescription for Omeprazole 20 mg daily. We also discussed diet and eating bland foods for now with avoidance of spicy and greasy foods until her GERD and nausea improve.  She will return in 2 weeks for her 3rd cycle of chemo. She is to call us prior to her appointment if her nausea worsens or if she has any other problems.

## 2014-10-01 NOTE — Progress Notes (Signed)
Patient Care Team: Jerlyn Ly, MD as PCP - General (Internal Medicine) Alphonsa Overall, MD as Consulting Physician (General Surgery) Rulon Eisenmenger, MD as Consulting Physician (Hematology and Oncology) Blair Promise, MD as Consulting Physician (Radiation Oncology)  DIAGNOSIS: Breast cancer of upper-inner quadrant of right female breast   Staging form: Breast, AJCC 7th Edition     Clinical: Stage IIA (T2, N0, cM0) - Signed by Rulon Eisenmenger, MD on 08/13/2014     Pathologic: No stage assigned - Unsigned   SUMMARY OF ONCOLOGIC HISTORY:   Breast cancer of upper-inner quadrant of right female breast   07/17/2014 Mammogram Right breast mass at 3:00 position by ultrasound measured 2.4 cm   08/06/2014 Initial Biopsy 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 After cycle 1 she was hospitalized with fevers and was discharged home on antibiotics. Carboplatin was stopped after 1 cycle. She is here today for toxicity check. She reports ongoing lack of energy, decreased appetite, and nausea. Has been using Zofran only about once a day. Vomited during the night, but did not take additional medications.  REVIEW OF SYSTEMS:   Constitutional: Denies fevers, chills or abnormal weight loss Eyes: Denies blurriness of vision Ears, nose, mouth, throat, and face: Denies mucositis or sore throat Respiratory:  Denies cough, dyspnea or wheezes Cardiovascular: Denies palpitation, chest discomfort or lower extremity swelling Gastrointestinal:  Denies change in bowel habits. Reports nausea and reflux symptoms. Skin: Denies abnormal skin rashes Lymphatics: Denies new lymphadenopathy or easy bruising Neurological:Denies numbness, tingling or new weaknesses Behavioral/Psych: Mood is stable, no new changes  All other systems were reviewed with the patient and are negative.  I have reviewed the past medical history, past surgical history, social history and family history with the patient and they are unchanged from previous note.  ALLERGIES:  has No Known Allergies.  MEDICATIONS:  Current Outpatient Prescriptions  Medication Sig Dispense Refill  . cefUROXime (CEFTIN) 500 MG tablet Take 1 tablet (500 mg total) by mouth 2 (two) times daily with a meal. 5 tablet 0  . cyanocobalamin (,VITAMIN B-12,) 1000 MCG/ML injection Inject 1,000 mcg into the muscle every 30 (thirty) days.    Marland Kitchen dexamethasone (DECADRON) 4 MG tablet Take 1 tablet (4 mg total) by mouth 2 (two) times daily. Start the day before Taxotere. Then again the day after chemo for 3 days. 30 tablet 1  . HYDROcodone-acetaminophen (NORCO/VICODIN) 5-325 MG per tablet Take 1-2 tablets by mouth every 6 (six) hours as needed. 30 tablet 0  . lidocaine-prilocaine (EMLA) cream Apply 1 application topically as needed. 30 g 0  . LORazepam (ATIVAN) 0.5 MG tablet Take 1 tablet (0.5 mg total) by mouth every 8 (eight) hours as needed for anxiety. 30 tablet 1  . LYRICA 75 MG capsule Take 75 mg by mouth 3 (three) times daily as needed (for pain).   0  . Multiple Vitamin (MULTIVITAMIN WITH MINERALS) TABS tablet Take 1 tablet by mouth daily.    Marland Kitchen  omeprazole (PRILOSEC) 20 MG capsule Take 1 capsule (20 mg total) by mouth daily. 30 capsule 2  . ondansetron (ZOFRAN) 8 MG tablet Take 1 tablet (8 mg total) by mouth every 8 (eight) hours as needed for nausea or vomiting. 30  tablet 1  . prochlorperazine (COMPAZINE) 10 MG tablet   0  . temazepam (RESTORIL) 30 MG capsule Take 30 mg by mouth at bedtime as needed for sleep.    . Vitamin D, Ergocalciferol, (DRISDOL) 50000 UNITS CAPS Take 50,000 Units by mouth every 7 (seven) days. Takes on Thursday or Friday     No current facility-administered medications for this visit.   Facility-Administered Medications Ordered in Other Visits  Medication Dose Route Frequency Provider Last Rate Last Dose  . sodium chloride 0.9 % injection 10 mL  10 mL Intracatheter PRN Rulon Eisenmenger, MD   10 mL at 09/04/14 1601    PHYSICAL EXAMINATION: ECOG PERFORMANCE STATUS: 2 - Symptomatic, <50% confined to bed  Filed Vitals:   10/01/14 1105  BP: 148/60  Pulse: 90  Temp: 97.4 F (36.3 C)  Resp: 18   Filed Weights   10/01/14 1105  Weight: 160 lb 11.2 oz (72.893 kg)    GENERAL:alert, no distress and comfortable SKIN: skin color, texture, turgor are normal, no rashes or significant lesions EYES: normal, Conjunctiva are pink and non-injected, sclera clear OROPHARYNX:no exudate, no erythema and lips, buccal mucosa, and tongue normal  NECK: supple, thyroid normal size, non-tender, without nodularity LYMPH:  no palpable lymphadenopathy in the cervical, axillary or inguinal LUNGS: clear to auscultation and percussion with normal breathing effort HEART: regular rate & rhythm and no murmurs and no lower extremity edema ABDOMEN:abdomen soft, non-tender and normal bowel sounds Musculoskeletal:no cyanosis of digits and no clubbing  NEURO: alert & oriented x 3 with fluent speech, no focal motor/sensory deficits  LABORATORY DATA:  I have reviewed the data as listed   Chemistry      Component Value Date/Time   NA 141 10/01/2014 1054   NA 138 09/10/2014 0526   K 4.0 10/01/2014 1054   K 3.9 09/10/2014 0526   CL 104 09/10/2014 0526   CO2 27 10/01/2014 1054   CO2 22 09/10/2014 0526   BUN 12.9 10/01/2014 1054   BUN 12 09/10/2014 0526    CREATININE 1.0 10/01/2014 1054   CREATININE 0.99 09/10/2014 0526      Component Value Date/Time   CALCIUM 9.8 10/01/2014 1054   CALCIUM 8.7 09/10/2014 0526   ALKPHOS 102 10/01/2014 1054   ALKPHOS 51 09/09/2014 0500   AST 30 10/01/2014 1054   AST 38* 09/09/2014 0500   ALT 19 10/01/2014 1054   ALT 40* 09/09/2014 0500   BILITOT 0.35 10/01/2014 1054   BILITOT 0.4 09/09/2014 0500       Lab Results  Component Value Date   WBC 23.3* 10/01/2014   HGB 9.9* 10/01/2014   HCT 30.1* 10/01/2014   MCV 95.5 10/01/2014   PLT 219 10/01/2014   NEUTROABS 17.7* 10/01/2014   ASSESSMENT & PLAN:  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 Fatigue related to chemotherapy. Able to perform ADLs, but has not been getting our of her house often.  2. Anemia due to chemotherapy. 3. Nausea 4. GERD  Plan: Recommended  taking frequent breaks and resting when she is able. Reviewed antiemetic use and how to take her medications. I have recommended that she use Zofran more frequently when she feels nauseated and to use Lorazepam or Compazine in addition to the Zofran. I have refilled her Zofran and Ativan today. For her GERD symptoms, I have given her a prescription for Omeprazole 20 mg daily. We also discussed diet and eating bland foods for now with avoidance of spicy and greasy foods until her GERD and nausea improve.  She will return in 2 weeks for her 3rd cycle of chemo. She is to call us prior to her appointment if her nausea worsens or if she has any other problems.   No orders of the defined types were placed in this encounter.   The patient has a good understanding of the overall plan. she agrees with it. She will call with any problems that may develop  before her next visit here.   Mikey Bussing, NP 10/01/2014 8:36 PM

## 2014-10-01 NOTE — Telephone Encounter (Signed)
lvm for pt regarding to Dec appt....mailed pt appt sched/avs and letter °

## 2014-10-16 ENCOUNTER — Telehealth: Payer: Self-pay | Admitting: Hematology and Oncology

## 2014-10-16 ENCOUNTER — Encounter: Payer: Self-pay | Admitting: Nurse Practitioner

## 2014-10-16 ENCOUNTER — Ambulatory Visit (HOSPITAL_BASED_OUTPATIENT_CLINIC_OR_DEPARTMENT_OTHER): Payer: Medicare Other | Admitting: Nurse Practitioner

## 2014-10-16 ENCOUNTER — Other Ambulatory Visit (HOSPITAL_BASED_OUTPATIENT_CLINIC_OR_DEPARTMENT_OTHER): Payer: Medicare Other

## 2014-10-16 ENCOUNTER — Other Ambulatory Visit: Payer: Self-pay | Admitting: Oncology

## 2014-10-16 ENCOUNTER — Ambulatory Visit (HOSPITAL_BASED_OUTPATIENT_CLINIC_OR_DEPARTMENT_OTHER): Payer: Medicare Other

## 2014-10-16 ENCOUNTER — Telehealth: Payer: Self-pay | Admitting: *Deleted

## 2014-10-16 VITALS — BP 138/62 | HR 82 | Temp 97.8°F | Resp 18 | Ht 61.0 in | Wt 161.9 lb

## 2014-10-16 DIAGNOSIS — T451X5A Adverse effect of antineoplastic and immunosuppressive drugs, initial encounter: Secondary | ICD-10-CM

## 2014-10-16 DIAGNOSIS — C50811 Malignant neoplasm of overlapping sites of right female breast: Secondary | ICD-10-CM

## 2014-10-16 DIAGNOSIS — Z171 Estrogen receptor negative status [ER-]: Secondary | ICD-10-CM

## 2014-10-16 DIAGNOSIS — Z5111 Encounter for antineoplastic chemotherapy: Secondary | ICD-10-CM

## 2014-10-16 DIAGNOSIS — C50211 Malignant neoplasm of upper-inner quadrant of right female breast: Secondary | ICD-10-CM

## 2014-10-16 DIAGNOSIS — Z5112 Encounter for antineoplastic immunotherapy: Secondary | ICD-10-CM

## 2014-10-16 DIAGNOSIS — D6481 Anemia due to antineoplastic chemotherapy: Secondary | ICD-10-CM

## 2014-10-16 LAB — CBC WITH DIFFERENTIAL/PLATELET
BASO%: 0.5 % (ref 0.0–2.0)
Basophils Absolute: 0 10*3/uL (ref 0.0–0.1)
EOS%: 0 % (ref 0.0–7.0)
Eosinophils Absolute: 0 10*3/uL (ref 0.0–0.5)
HCT: 29.2 % — ABNORMAL LOW (ref 34.8–46.6)
HGB: 9.5 g/dL — ABNORMAL LOW (ref 11.6–15.9)
LYMPH#: 1.4 10*3/uL (ref 0.9–3.3)
LYMPH%: 22.7 % (ref 14.0–49.7)
MCH: 32.1 pg (ref 25.1–34.0)
MCHC: 32.6 g/dL (ref 31.5–36.0)
MCV: 98.4 fL (ref 79.5–101.0)
MONO#: 0.7 10*3/uL (ref 0.1–0.9)
MONO%: 10.9 % (ref 0.0–14.0)
NEUT#: 4.1 10*3/uL (ref 1.5–6.5)
NEUT%: 65.9 % (ref 38.4–76.8)
Platelets: 345 10*3/uL (ref 145–400)
RBC: 2.97 10*6/uL — AB (ref 3.70–5.45)
RDW: 15.8 % — AB (ref 11.2–14.5)
WBC: 6.2 10*3/uL (ref 3.9–10.3)

## 2014-10-16 LAB — COMPREHENSIVE METABOLIC PANEL (CC13)
ALBUMIN: 3.5 g/dL (ref 3.5–5.0)
ALT: 19 U/L (ref 0–55)
AST: 19 U/L (ref 5–34)
Alkaline Phosphatase: 49 U/L (ref 40–150)
Anion Gap: 8 mEq/L (ref 3–11)
BUN: 17.6 mg/dL (ref 7.0–26.0)
CALCIUM: 9.5 mg/dL (ref 8.4–10.4)
CHLORIDE: 110 meq/L — AB (ref 98–109)
CO2: 23 mEq/L (ref 22–29)
Creatinine: 1.1 mg/dL (ref 0.6–1.1)
EGFR: 51 mL/min/{1.73_m2} — AB (ref >90)
GLUCOSE: 106 mg/dL (ref 70–140)
POTASSIUM: 3.7 meq/L (ref 3.5–5.1)
Sodium: 141 mEq/L (ref 136–145)
TOTAL PROTEIN: 6.3 g/dL — AB (ref 6.4–8.3)
Total Bilirubin: 0.43 mg/dL (ref 0.20–1.20)

## 2014-10-16 MED ORDER — ACETAMINOPHEN 325 MG PO TABS
650.0000 mg | ORAL_TABLET | Freq: Once | ORAL | Status: AC
  Administered 2014-10-16: 650 mg via ORAL

## 2014-10-16 MED ORDER — DEXAMETHASONE SODIUM PHOSPHATE 20 MG/5ML IJ SOLN
20.0000 mg | Freq: Once | INTRAMUSCULAR | Status: AC
  Administered 2014-10-16: 20 mg via INTRAVENOUS

## 2014-10-16 MED ORDER — ONDANSETRON 16 MG/50ML IVPB (CHCC)
16.0000 mg | Freq: Once | INTRAVENOUS | Status: AC
  Administered 2014-10-16: 16 mg via INTRAVENOUS

## 2014-10-16 MED ORDER — SODIUM CHLORIDE 0.9 % IV SOLN
420.0000 mg | Freq: Once | INTRAVENOUS | Status: AC
  Administered 2014-10-16: 420 mg via INTRAVENOUS
  Filled 2014-10-16: qty 14

## 2014-10-16 MED ORDER — DIPHENHYDRAMINE HCL 25 MG PO CAPS
50.0000 mg | ORAL_CAPSULE | Freq: Once | ORAL | Status: AC
  Administered 2014-10-16: 50 mg via ORAL

## 2014-10-16 MED ORDER — DIPHENHYDRAMINE HCL 25 MG PO CAPS
ORAL_CAPSULE | ORAL | Status: AC
  Filled 2014-10-16: qty 2

## 2014-10-16 MED ORDER — DEXAMETHASONE SODIUM PHOSPHATE 20 MG/5ML IJ SOLN
INTRAMUSCULAR | Status: AC
  Filled 2014-10-16: qty 5

## 2014-10-16 MED ORDER — DOCETAXEL CHEMO INJECTION 160 MG/16ML
65.0000 mg/m2 | Freq: Once | INTRAVENOUS | Status: AC
  Administered 2014-10-16: 120 mg via INTRAVENOUS
  Filled 2014-10-16: qty 12

## 2014-10-16 MED ORDER — TRASTUZUMAB CHEMO INJECTION 440 MG
6.0000 mg/kg | Freq: Once | INTRAVENOUS | Status: AC
  Administered 2014-10-16: 462 mg via INTRAVENOUS
  Filled 2014-10-16: qty 22

## 2014-10-16 MED ORDER — ACETAMINOPHEN 325 MG PO TABS
ORAL_TABLET | ORAL | Status: AC
  Filled 2014-10-16: qty 2

## 2014-10-16 MED ORDER — SODIUM CHLORIDE 0.9 % IJ SOLN
10.0000 mL | INTRAMUSCULAR | Status: DC | PRN
  Filled 2014-10-16: qty 10

## 2014-10-16 MED ORDER — ONDANSETRON 16 MG/50ML IVPB (CHCC)
INTRAVENOUS | Status: AC
  Filled 2014-10-16: qty 16

## 2014-10-16 MED ORDER — SODIUM CHLORIDE 0.9 % IV SOLN
Freq: Once | INTRAVENOUS | Status: AC
  Administered 2014-10-16: 13:00:00 via INTRAVENOUS

## 2014-10-16 NOTE — Telephone Encounter (Signed)
Called pt to inquire about her of appt today b/c she is not here. No answer. Left a detailed message to pt's VM concerning appt, also called mobile phone, line was busy signal. Message to be forwarded to [employer].

## 2014-10-16 NOTE — Patient Instructions (Signed)
[facility] Discharge Instructions for Patients Receiving Chemotherapy  Today you received the following chemotherapy agents Taxotere, Perjeta, Herceptin.  To help prevent nausea and vomiting after your treatment, we encourage you to take your nausea medication as directed.   If you develop nausea and vomiting that is not controlled by your nausea medication, call the clinic.   BELOW ARE SYMPTOMS THAT SHOULD BE REPORTED IMMEDIATELY:  *FEVER GREATER THAN 100.5 F  *CHILLS WITH OR WITHOUT FEVER  NAUSEA AND VOMITING THAT IS NOT CONTROLLED WITH YOUR NAUSEA MEDICATION  *UNUSUAL SHORTNESS OF BREATH  *UNUSUAL BRUISING OR BLEEDING  TENDERNESS IN MOUTH AND THROAT WITH OR WITHOUT PRESENCE OF ULCERS  *URINARY PROBLEMS  *BOWEL PROBLEMS  UNUSUAL RASH Items with * indicate a potential emergency and should be followed up as soon as possible.  Feel free to call the clinic you have any questions or concerns. The clinic phone number is [phone].

## 2014-10-16 NOTE — Telephone Encounter (Signed)
, °

## 2014-10-16 NOTE — Progress Notes (Signed)
Patient Care Team: Jerlyn Ly, MD as PCP - General (Internal Medicine) Alphonsa Overall, MD as Consulting Physician (General Surgery) Rulon Eisenmenger, MD as Consulting Physician (Hematology and Oncology) Blair Promise, MD as Consulting Physician (Radiation Oncology)  DIAGNOSIS: Breast cancer of upper-inner quadrant of right female breast   Staging form: Breast, AJCC 7th Edition     Clinical: Stage IIA (T2, N0, cM0) - Signed by Rulon Eisenmenger, MD on 08/13/2014     Pathologic: No stage assigned - Unsigned   SUMMARY OF ONCOLOGIC HISTORY:   Breast cancer of upper-inner quadrant of right female breast   07/17/2014 Mammogram Right breast mass at 3:00 position by ultrasound measured 2.4 cm   08/06/2014 Initial Biopsy 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 After cycle 1 she was hospitalized with fevers and was discharged home on antibiotics. Carboplatin was stopped after 1 cycle.   REVIEW OF SYSTEMS:   Gerlean denies fevers, chills, nausea, vomiting, or changes in bowel or bladder habits. Her reflux is better with the omeprazole prescribed last week. She denies mouth sores or rashes. Her appetite is decreased because she has lost her entire sense of taste. She used to drink lots of water but has to force herself now. She has lower back and bilateral thigh pain possibly related to neupogen. Her  fatigue is manageable. She denies shortness of breath, chest pain, cough, or palpitations.   All other systems were reviewed with the patient and are negative.  I have reviewed the past medical history, past surgical history, social history and family history with the patient and they are unchanged from previous note.  ALLERGIES:  has No Known Allergies.  MEDICATIONS:  Current Outpatient Prescriptions  Medication Sig Dispense Refill  . dexamethasone (DECADRON) 4 MG tablet Take 1 tablet (4 mg total) by mouth 2 (two) times daily. Start the day before Taxotere. Then again the day after chemo for 3 days. 30 tablet 1  . LYRICA 75 MG capsule Take 75 mg by mouth 3 (three) times daily as needed (for pain).   0  . Multiple Vitamin (MULTIVITAMIN WITH MINERALS) TABS tablet Take 1 tablet by mouth daily.    Marland Kitchen omeprazole (PRILOSEC) 20 MG capsule Take 1 capsule (20 mg total) by mouth daily. 30 capsule 2  . temazepam (RESTORIL) 30 MG capsule Take 30 mg by mouth at bedtime as needed for sleep.    . cyanocobalamin (,VITAMIN B-12,) 1000 MCG/ML injection Inject 1,000 mcg into the muscle every 30 (thirty) days.    Marland Kitchen HYDROcodone-acetaminophen (NORCO/VICODIN) 5-325 MG per tablet Take 1-2 tablets by mouth every 6 (six) hours as needed. (Patient not taking: Reported on 10/16/2014) 30 tablet 0  . lidocaine-prilocaine (EMLA) cream Apply 1 application topically as needed. (Patient not taking: Reported on 10/16/2014) 30 g 0  . LORazepam (ATIVAN) 0.5 MG tablet Take 1 tablet (0.5 mg total) by mouth every 8 (eight) hours as needed for anxiety. (Patient not taking: Reported on  10/16/2014) 30 tablet 1  . ondansetron (ZOFRAN) 8 MG tablet Take 1 tablet (8 mg total) by mouth every 8 (eight) hours as needed for nausea or vomiting. (Patient not taking: Reported on 10/16/2014) 30 tablet 1  . prochlorperazine (COMPAZINE) 10 MG tablet   0  . Vitamin D, Ergocalciferol, (DRISDOL) 50000 UNITS CAPS Take 50,000 Units by mouth every 7  (seven) days. Takes on Thursday or Friday     No current facility-administered medications for this visit.   Facility-Administered Medications Ordered in Other Visits  Medication Dose Route Frequency Provider Last Rate Last Dose  . sodium chloride 0.9 % injection 10 mL  10 mL Intracatheter PRN Rulon Eisenmenger, MD   10 mL at 09/04/14 1601    PHYSICAL EXAMINATION: ECOG PERFORMANCE STATUS: 2 - Symptomatic, <50% confined to bed  Filed Vitals:   10/16/14 1135  BP: 138/62  Pulse: 82  Temp: 97.8 F (36.6 C)  Resp: 18   Filed Weights   10/16/14 1135  Weight: 161 lb 14.4 oz (73.437 kg)   Skin: warm, dry  HEENT: sclerae anicteric, conjunctivae pink, oropharynx clear. No thrush or mucositis.  Lymph Nodes: No cervical or supraclavicular lymphadenopathy  Lungs: clear to auscultation bilaterally, no rales, wheezes, or rhonci  Heart: regular rate and rhythm  Abdomen: round, soft, non tender, positive bowel sounds  Musculoskeletal: No focal spinal tenderness, no peripheral edema  Neuro: non focal, well oriented, positive affect  Breasts: deferred  LABORATORY DATA:  I have reviewed the data as listed   Chemistry      Component Value Date/Time   NA 141 10/16/2014 1124   NA 138 09/10/2014 0526   K 3.7 10/16/2014 1124   K 3.9 09/10/2014 0526   CL 104 09/10/2014 0526   CO2 23 10/16/2014 1124   CO2 22 09/10/2014 0526   BUN 17.6 10/16/2014 1124   BUN 12 09/10/2014 0526   CREATININE 1.1 10/16/2014 1124   CREATININE 0.99 09/10/2014 0526      Component Value Date/Time   CALCIUM 9.5 10/16/2014 1124   CALCIUM 8.7 09/10/2014 0526   ALKPHOS 49 10/16/2014 1124   ALKPHOS 51 09/09/2014 0500   AST 19 10/16/2014 1124   AST 38* 09/09/2014 0500   ALT 19 10/16/2014 1124   ALT 40* 09/09/2014 0500   BILITOT 0.43 10/16/2014 1124   BILITOT 0.4 09/09/2014 0500       Lab Results  Component Value Date   WBC 6.2 10/16/2014   HGB 9.5* 10/16/2014   HCT 29.2* 10/16/2014   MCV 98.4 10/16/2014    PLT 345 10/16/2014   NEUTROABS 4.1 10/16/2014   ASSESSMENT & PLAN:  Breast cancer of upper-inner quadrant of right female breast Right breast invasive mammary cancer most likely ductal phenotype grade 3, 2.4 cm mass at 3:00 position ER negative PR negative HER-2 positive ratio 2.75, Ki-67 80% clinical stage IIA, T2, N0, M0.   The labs were reviewed in detail and were entirely stable. She continues to have treatment related anemia with a hgb of 9.5, but besides fatigue she is asymptomatic. We will continue to monitor this value. She will proceed with cycle 3 of taxotere, herceptin, and perjeta.   She will continue to use her PRN antiemetics and omeprazole to control her gastric symptoms. I have encouraged her to push meals and fluids. She will try carnation breakfast packets as an easy way to get calories and nutrients in the future.   Mava will return next week for labs and a  nadir visit.    No orders of the defined types were placed in this encounter.   The patient has a good understanding of the overall plan. she agrees with it. She will call with any problems that may develop before her next visit here.   Marcelino Duster, NP 10/16/2014 12:33 PM

## 2014-10-16 NOTE — Assessment & Plan Note (Signed)
Right breast invasive mammary cancer most likely ductal phenotype grade 3, 2.4 cm mass at 3:00 position ER negative PR negative HER-2 positive ratio 2.75, Ki-67 80% clinical stage IIA, T2, N0, M0.   The labs were reviewed in detail and were entirely stable. She continues to have treatment related anemia with a hgb of 9.5, but besides fatigue she is asymptomatic. We will continue to monitor this value. She will proceed with cycle 3 of taxotere, herceptin, and perjeta.   She will continue to use her PRN antiemetics and omeprazole to control her gastric symptoms. I have encouraged her to push meals and fluids. She will try carnation breakfast packets as an easy way to get calories and nutrients in the future.   Kelli Owens will return next week for labs and a nadir visit.

## 2014-10-18 ENCOUNTER — Ambulatory Visit (HOSPITAL_BASED_OUTPATIENT_CLINIC_OR_DEPARTMENT_OTHER): Payer: PPO

## 2014-10-18 DIAGNOSIS — C50811 Malignant neoplasm of overlapping sites of right female breast: Secondary | ICD-10-CM

## 2014-10-18 DIAGNOSIS — Z5189 Encounter for other specified aftercare: Secondary | ICD-10-CM

## 2014-10-18 DIAGNOSIS — C50211 Malignant neoplasm of upper-inner quadrant of right female breast: Secondary | ICD-10-CM

## 2014-10-18 MED ORDER — PEGFILGRASTIM INJECTION 6 MG/0.6ML ~~LOC~~
6.0000 mg | PREFILLED_SYRINGE | Freq: Once | SUBCUTANEOUS | Status: AC
  Administered 2014-10-18: 6 mg via SUBCUTANEOUS

## 2014-10-20 ENCOUNTER — Telehealth: Payer: Self-pay | Admitting: *Deleted

## 2014-10-20 NOTE — Telephone Encounter (Signed)
Per staff message and POF I have scheduled appts. Advised scheduler of appts. JMW  

## 2014-10-24 ENCOUNTER — Ambulatory Visit (HOSPITAL_BASED_OUTPATIENT_CLINIC_OR_DEPARTMENT_OTHER): Payer: PPO | Admitting: Hematology and Oncology

## 2014-10-24 ENCOUNTER — Telehealth: Payer: Self-pay | Admitting: *Deleted

## 2014-10-24 ENCOUNTER — Telehealth: Payer: Self-pay | Admitting: Hematology and Oncology

## 2014-10-24 ENCOUNTER — Other Ambulatory Visit (HOSPITAL_BASED_OUTPATIENT_CLINIC_OR_DEPARTMENT_OTHER): Payer: PPO

## 2014-10-24 DIAGNOSIS — C50211 Malignant neoplasm of upper-inner quadrant of right female breast: Secondary | ICD-10-CM

## 2014-10-24 DIAGNOSIS — D6959 Other secondary thrombocytopenia: Secondary | ICD-10-CM

## 2014-10-24 DIAGNOSIS — C50811 Malignant neoplasm of overlapping sites of right female breast: Secondary | ICD-10-CM

## 2014-10-24 DIAGNOSIS — D72829 Elevated white blood cell count, unspecified: Secondary | ICD-10-CM

## 2014-10-24 DIAGNOSIS — R5383 Other fatigue: Secondary | ICD-10-CM

## 2014-10-24 LAB — CBC WITH DIFFERENTIAL/PLATELET
BASO%: 0.5 % (ref 0.0–2.0)
Basophils Absolute: 0.2 10*3/uL — ABNORMAL HIGH (ref 0.0–0.1)
EOS ABS: 0 10*3/uL (ref 0.0–0.5)
EOS%: 0.1 % (ref 0.0–7.0)
HCT: 29.3 % — ABNORMAL LOW (ref 34.8–46.6)
HEMOGLOBIN: 9.4 g/dL — AB (ref 11.6–15.9)
LYMPH#: 3.3 10*3/uL (ref 0.9–3.3)
LYMPH%: 10.7 % — AB (ref 14.0–49.7)
MCH: 31.5 pg (ref 25.1–34.0)
MCHC: 32.1 g/dL (ref 31.5–36.0)
MCV: 98 fL (ref 79.5–101.0)
MONO#: 0.5 10*3/uL (ref 0.1–0.9)
MONO%: 1.8 % (ref 0.0–14.0)
NEUT%: 86.9 % — AB (ref 38.4–76.8)
NEUTROS ABS: 26.7 10*3/uL — AB (ref 1.5–6.5)
PLATELETS: 233 10*3/uL (ref 145–400)
RBC: 2.98 10*6/uL — ABNORMAL LOW (ref 3.70–5.45)
RDW: 15.9 % — AB (ref 11.2–14.5)
WBC: 30.7 10*3/uL — ABNORMAL HIGH (ref 3.9–10.3)

## 2014-10-24 LAB — COMPREHENSIVE METABOLIC PANEL (CC13)
ALK PHOS: 78 U/L (ref 40–150)
ALT: 14 U/L (ref 0–55)
AST: 27 U/L (ref 5–34)
Albumin: 3.4 g/dL — ABNORMAL LOW (ref 3.5–5.0)
Anion Gap: 8 mEq/L (ref 3–11)
BILIRUBIN TOTAL: 0.31 mg/dL (ref 0.20–1.20)
BUN: 10.2 mg/dL (ref 7.0–26.0)
CALCIUM: 9.6 mg/dL (ref 8.4–10.4)
CO2: 27 mEq/L (ref 22–29)
Chloride: 106 mEq/L (ref 98–109)
Creatinine: 1.1 mg/dL (ref 0.6–1.1)
EGFR: 51 mL/min/{1.73_m2} — ABNORMAL LOW (ref >90)
Glucose: 95 mg/dl (ref 70–140)
Potassium: 3.5 mEq/L (ref 3.5–5.1)
SODIUM: 141 meq/L (ref 136–145)
TOTAL PROTEIN: 6.3 g/dL — AB (ref 6.4–8.3)

## 2014-10-24 NOTE — Telephone Encounter (Signed)
, °

## 2014-10-24 NOTE — Progress Notes (Signed)
Patient Care Team: Jerlyn Ly, MD as PCP - General (Internal Medicine) Alphonsa Overall, MD as Consulting Physician (General Surgery) Rulon Eisenmenger, MD as Consulting Physician (Hematology and Oncology) Blair Promise, MD as Consulting Physician (Radiation Oncology)  DIAGNOSIS: Breast cancer of upper-inner quadrant of right female breast   Staging form: Breast, AJCC 7th Edition     Clinical: Stage IIA (T2, N0, cM0) - Signed by Rulon Eisenmenger, MD on 08/13/2014     Pathologic: No stage assigned - Unsigned   SUMMARY OF ONCOLOGIC HISTORY:   Breast cancer of upper-inner quadrant of right female breast   07/17/2014 Mammogram Right breast mass at 3:00 position by ultrasound measured 2.4 cm   08/06/2014 Initial Biopsy 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 After first cycle she had to be hospitalized for pneumonia, with the second cycle carboplatin was discontinued and she received Taxotere Herceptin Perjeta. She developed profound fatigue as a result of chemotherapy. It is to the point that she is not able to even do basic activities of daily living. In the morning she has a lot of energy but then by afternoon she is sleeping most of the time. This has resulted in her not eating as adequately. But she is trying to drink as much liquids as she can. She does have  occasional nausea but no vomiting. Denies any fevers or chills.  REVIEW OF SYSTEMS:   Constitutional: Denies fevers, chills or abnormal weight loss; severe fatigue Eyes: Denies blurriness of vision Ears, nose, mouth, throat, and face: Denies mucositis or sore throat Respiratory: Denies cough, dyspnea or wheezes Cardiovascular: Denies palpitation, chest discomfort or lower extremity swelling Gastrointestinal:  Denies nausea, heartburn or change in bowel habits Skin: Denies abnormal skin rashes Lymphatics: Denies new lymphadenopathy or easy bruising Neurological:Denies numbness, tingling or new weaknesses Behavioral/Psych: Mood is stable, no new changes  All other systems were reviewed with the patient and are negative.  I have reviewed the past medical history, past surgical history, social history and family history with the patient and they are unchanged from previous note.  ALLERGIES:  has No Known Allergies.  MEDICATIONS:  Current Outpatient Prescriptions  Medication Sig Dispense Refill  . cyanocobalamin (,VITAMIN B-12,) 1000 MCG/ML injection Inject 1,000 mcg into the muscle every 30 (thirty) days.    Marland Kitchen dexamethasone (DECADRON) 4 MG tablet Take 1 tablet (4 mg total) by mouth 2 (two) times daily. Start the day before Taxotere. Then again the day after chemo for 3 days. 30 tablet 1  . HYDROcodone-acetaminophen (NORCO/VICODIN) 5-325 MG per tablet Take 1-2 tablets by mouth every 6 (six) hours as needed. 30 tablet 0  . lidocaine-prilocaine (EMLA) cream Apply 1 application topically as needed. 30 g 0  . LORazepam (ATIVAN) 0.5 MG tablet Take 1 tablet (0.5 mg total) by mouth every 8 (eight) hours as needed for anxiety. 30 tablet 1  . LYRICA 75 MG capsule Take 75 mg by  mouth 3 (three) times daily as needed (for pain).   0  . Multiple Vitamin (MULTIVITAMIN WITH MINERALS) TABS tablet Take 1 tablet by mouth daily.    Marland Kitchen omeprazole (PRILOSEC) 20 MG capsule Take 1 capsule (20 mg total) by mouth  daily. 30 capsule 2  . ondansetron (ZOFRAN) 8 MG tablet Take 1 tablet (8 mg total) by mouth every 8 (eight) hours as needed for nausea or vomiting. 30 tablet 1  . prochlorperazine (COMPAZINE) 10 MG tablet   0  . temazepam (RESTORIL) 30 MG capsule Take 30 mg by mouth at bedtime as needed for sleep.    . Vitamin D, Ergocalciferol, (DRISDOL) 50000 UNITS CAPS Take 50,000 Units by mouth every 7 (seven) days. Takes on Thursday or Friday     No current facility-administered medications for this visit.   Facility-Administered Medications Ordered in Other Visits  Medication Dose Route Frequency Provider Last Rate Last Dose  . sodium chloride 0.9 % injection 10 mL  10 mL Intracatheter PRN Rulon Eisenmenger, MD   10 mL at 09/04/14 1601    PHYSICAL EXAMINATION: ECOG PERFORMANCE STATUS: 1 - Symptomatic but completely ambulatory  Filed Vitals:   10/24/14 1159  BP: 125/51  Pulse: 79  Temp: 97.8 F (36.6 C)  Resp: 18   Filed Weights   10/24/14 1159  Weight: 160 lb 1.6 oz (72.621 kg)    GENERAL:alert, no distress and comfortable SKIN: skin color, texture, turgor are normal, no rashes or significant lesions EYES: normal, Conjunctiva are pink and non-injected, sclera clear OROPHARYNX:no exudate, no erythema and lips, buccal mucosa, and tongue normal  NECK: supple, thyroid normal size, non-tender, without nodularity LYMPH:  no palpable lymphadenopathy in the cervical, axillary or inguinal LUNGS: clear to auscultation and percussion with normal breathing effort HEART: regular rate & rhythm and no murmurs and no lower extremity edema ABDOMEN:abdomen soft, non-tender and normal bowel sounds Musculoskeletal:no cyanosis of digits and no clubbing  NEURO: alert & oriented x 3 with fluent speech, no focal motor/sensory deficits  LABORATORY DATA:  I have reviewed the data as listed   Chemistry      Component Value Date/Time   NA 141 10/24/2014 1127   NA 138 09/10/2014 0526   K 3.5 10/24/2014 1127    K 3.9 09/10/2014 0526   CL 104 09/10/2014 0526   CO2 27 10/24/2014 1127   CO2 22 09/10/2014 0526   BUN 10.2 10/24/2014 1127   BUN 12 09/10/2014 0526   CREATININE 1.1 10/24/2014 1127   CREATININE 0.99 09/10/2014 0526      Component Value Date/Time   CALCIUM 9.6 10/24/2014 1127   CALCIUM 8.7 09/10/2014 0526   ALKPHOS 78 10/24/2014 1127   ALKPHOS 51 09/09/2014 0500   AST 27 10/24/2014 1127   AST 38* 09/09/2014 0500   ALT 14 10/24/2014 1127   ALT 40* 09/09/2014 0500   BILITOT 0.31 10/24/2014 1127   BILITOT 0.4 09/09/2014 0500       Lab Results  Component Value Date   WBC 30.7* 10/24/2014   HGB 9.4* 10/24/2014   HCT 29.3* 10/24/2014   MCV 98.0 10/24/2014   PLT 233 10/24/2014   NEUTROABS 26.7* 10/24/2014   ASSESSMENT & PLAN:  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 Hospitalization  for pneumonia after cycle 1 ( patient had fevers up 102 and was found to have slight infiltrate on chest x-ray. Unclear if the fever was related to a true pneumonia versus Neulasta related pyrexia) 2. Chemotherapy-induced neutropenia: Resolved after carboplatin was discontinued and Taxotere dose reduced  3. Chemotherapy-induced anemia: Stable with a hemoglobin of 9.4  4. Chemotherapy-induced thrombocytopenia 5. Profound fatigue: Grade 3 we will reduce the dosage of Taxotere to 40 mg/m  5. Diminished appetite  I reviewed her blood work which showed leukocytosis related to prior Neulasta. I would discontinue Neulasta injections from subsequent cycles.  Plan: reduction in dosage of Taxotere to 40 mg meter square. Return to clinic in 3 week for cycle 4. If she cannot tolerate even this dosage of Taxotere, will discontinue chemotherapy and will remain on  Herceptin and Perjeta for the next 3 cycles.  monitoring closely for toxicities   Orders Placed This Encounter  Procedures  . CBC with Differential    Standing Status: Future     Number of Occurrences:      Standing Expiration Date: 10/24/2015  . Comprehensive metabolic panel (Cmet) - [facility]    Standing Status: Future     Number of Occurrences:      Standing Expiration Date: 10/24/2015   The patient has a good understanding of the overall plan. she agrees with it. She will call with any problems that may develop before her next visit here.   Rulon Eisenmenger, MD 10/24/2014 12:21 PM

## 2014-10-24 NOTE — Telephone Encounter (Signed)
Per staff message and POF I have scheduled appts. Advised scheduler of appts. JMW  

## 2014-10-24 NOTE — Assessment & Plan Note (Signed)
Right breast invasive mammary cancer most likely ductal phenotype grade 3, 2.4 cm mass at 3:00 position ER negative PR negative HER-2 positive ratio 2.75, Ki-67 80% clinical stage IIA, T2, N0, M0  Toxicities of chemotherapy: 1. Hospitalization for pneumonia after cycle 1 ( patient had fevers up 102 and was found to have slight infiltrate on chest x-ray. Unclear if the fever was related to a true pneumonia versus Neulasta related pyrexia) 2. Chemotherapy-induced neutropenia: Resolved after carboplatin was discontinued and Taxotere dose reduced  3. Chemotherapy-induced anemia: Stable with a hemoglobin of 9.4  4. Chemotherapy-induced thrombocytopenia 5. Profound fatigue: Grade 3 we will reduce the dosage of Taxotere to 40 mg/m  5. Diminished appetite  I reviewed her blood work which showed leukocytosis related to prior Neulasta. I would discontinue Neulasta injections from subsequent cycles.  Plan: reduction in dosage of Taxotere to 40 mg meter square. Return to clinic in 3 week for cycle 4. If she cannot tolerate even this dosage of Taxotere, will discontinue chemotherapy and will remain on Herceptin and Perjeta for the next 3 cycles.  monitoring closely for toxicities

## 2014-10-27 ENCOUNTER — Telehealth: Payer: Self-pay

## 2014-10-27 NOTE — Telephone Encounter (Signed)
Authorization confirmation [id number] received from [employer].  Given to managed care.  Copy to scan.

## 2014-10-29 ENCOUNTER — Other Ambulatory Visit: Payer: Self-pay

## 2014-10-30 ENCOUNTER — Other Ambulatory Visit: Payer: Medicare Other

## 2014-10-30 ENCOUNTER — Ambulatory Visit: Payer: PPO

## 2014-10-30 ENCOUNTER — Ambulatory Visit: Payer: Medicare Other | Admitting: Hematology and Oncology

## 2014-10-31 ENCOUNTER — Telehealth: Payer: Self-pay | Admitting: *Deleted

## 2014-10-31 ENCOUNTER — Other Ambulatory Visit: Payer: Self-pay | Admitting: *Deleted

## 2014-10-31 ENCOUNTER — Ambulatory Visit: Payer: Medicare Other

## 2014-10-31 DIAGNOSIS — C50211 Malignant neoplasm of upper-inner quadrant of right female breast: Secondary | ICD-10-CM

## 2014-10-31 MED ORDER — LORAZEPAM 0.5 MG PO TABS: 0.5000 mg | ORAL_TABLET | Freq: Three times a day (TID) | ORAL | Status: DC | PRN

## 2014-10-31 NOTE — Telephone Encounter (Signed)
Per staff message and POF I have scheduled appts. Advised scheduler of appts and no available moved to 1/22. JMW

## 2014-11-03 ENCOUNTER — Telehealth: Payer: Self-pay | Admitting: Hematology and Oncology

## 2014-11-03 NOTE — Telephone Encounter (Signed)
, °

## 2014-11-06 ENCOUNTER — Ambulatory Visit (HOSPITAL_BASED_OUTPATIENT_CLINIC_OR_DEPARTMENT_OTHER): Payer: PPO | Admitting: Hematology and Oncology

## 2014-11-06 ENCOUNTER — Ambulatory Visit (HOSPITAL_BASED_OUTPATIENT_CLINIC_OR_DEPARTMENT_OTHER): Payer: PPO

## 2014-11-06 ENCOUNTER — Telehealth: Payer: Self-pay | Admitting: Hematology and Oncology

## 2014-11-06 ENCOUNTER — Other Ambulatory Visit: Payer: Self-pay | Admitting: Hematology and Oncology

## 2014-11-06 ENCOUNTER — Other Ambulatory Visit (HOSPITAL_BASED_OUTPATIENT_CLINIC_OR_DEPARTMENT_OTHER): Payer: PPO

## 2014-11-06 ENCOUNTER — Telehealth: Payer: Self-pay | Admitting: *Deleted

## 2014-11-06 VITALS — BP 132/58 | HR 82 | Temp 97.8°F | Resp 18 | Ht 61.0 in | Wt 158.9 lb

## 2014-11-06 DIAGNOSIS — Z5112 Encounter for antineoplastic immunotherapy: Secondary | ICD-10-CM

## 2014-11-06 DIAGNOSIS — C50811 Malignant neoplasm of overlapping sites of right female breast: Secondary | ICD-10-CM

## 2014-11-06 DIAGNOSIS — C50211 Malignant neoplasm of upper-inner quadrant of right female breast: Secondary | ICD-10-CM

## 2014-11-06 DIAGNOSIS — D6481 Anemia due to antineoplastic chemotherapy: Secondary | ICD-10-CM

## 2014-11-06 DIAGNOSIS — R630 Anorexia: Secondary | ICD-10-CM

## 2014-11-06 DIAGNOSIS — Z5111 Encounter for antineoplastic chemotherapy: Secondary | ICD-10-CM

## 2014-11-06 DIAGNOSIS — D6959 Other secondary thrombocytopenia: Secondary | ICD-10-CM

## 2014-11-06 DIAGNOSIS — R5383 Other fatigue: Secondary | ICD-10-CM

## 2014-11-06 LAB — CBC WITH DIFFERENTIAL/PLATELET
BASO%: 0.8 % (ref 0.0–2.0)
Basophils Absolute: 0 10*3/uL (ref 0.0–0.1)
EOS%: 0.2 % (ref 0.0–7.0)
Eosinophils Absolute: 0 10*3/uL (ref 0.0–0.5)
HEMATOCRIT: 29 % — AB (ref 34.8–46.6)
HGB: 9.4 g/dL — ABNORMAL LOW (ref 11.6–15.9)
LYMPH#: 1.7 10*3/uL (ref 0.9–3.3)
LYMPH%: 38.7 % (ref 14.0–49.7)
MCH: 31.9 pg (ref 25.1–34.0)
MCHC: 32.6 g/dL (ref 31.5–36.0)
MCV: 97.8 fL (ref 79.5–101.0)
MONO#: 0.7 10*3/uL (ref 0.1–0.9)
MONO%: 15.4 % — AB (ref 0.0–14.0)
NEUT%: 44.9 % (ref 38.4–76.8)
NEUTROS ABS: 2 10*3/uL (ref 1.5–6.5)
Platelets: 326 10*3/uL (ref 145–400)
RBC: 2.96 10*6/uL — AB (ref 3.70–5.45)
RDW: 15.4 % — ABNORMAL HIGH (ref 11.2–14.5)
WBC: 4.5 10*3/uL (ref 3.9–10.3)

## 2014-11-06 LAB — COMPREHENSIVE METABOLIC PANEL (CC13)
ALBUMIN: 3.5 g/dL (ref 3.5–5.0)
ALK PHOS: 51 U/L (ref 40–150)
ALT: 17 U/L (ref 0–55)
ANION GAP: 7 meq/L (ref 3–11)
AST: 23 U/L (ref 5–34)
BILIRUBIN TOTAL: 0.5 mg/dL (ref 0.20–1.20)
BUN: 17.1 mg/dL (ref 7.0–26.0)
CALCIUM: 9.2 mg/dL (ref 8.4–10.4)
CO2: 25 mEq/L (ref 22–29)
Chloride: 109 mEq/L (ref 98–109)
Creatinine: 0.9 mg/dL (ref 0.6–1.1)
EGFR: 60 mL/min/{1.73_m2} — AB (ref >90)
GLUCOSE: 92 mg/dL (ref 70–140)
Potassium: 4 mEq/L (ref 3.5–5.1)
SODIUM: 142 meq/L (ref 136–145)
Total Protein: 6.1 g/dL — ABNORMAL LOW (ref 6.4–8.3)

## 2014-11-06 MED ORDER — ONDANSETRON 16 MG/50ML IVPB (CHCC)
INTRAVENOUS | Status: AC
  Filled 2014-11-06: qty 16

## 2014-11-06 MED ORDER — ACETAMINOPHEN 325 MG PO TABS
ORAL_TABLET | ORAL | Status: AC
  Filled 2014-11-06: qty 2

## 2014-11-06 MED ORDER — TRASTUZUMAB CHEMO INJECTION 440 MG
6.0000 mg/kg | Freq: Once | INTRAVENOUS | Status: AC
  Administered 2014-11-06: 462 mg via INTRAVENOUS
  Filled 2014-11-06: qty 22

## 2014-11-06 MED ORDER — PERTUZUMAB CHEMO INJECTION 420 MG/14ML
420.0000 mg | Freq: Once | INTRAVENOUS | Status: AC
  Administered 2014-11-06: 420 mg via INTRAVENOUS
  Filled 2014-11-06: qty 14

## 2014-11-06 MED ORDER — ONDANSETRON 16 MG/50ML IVPB (CHCC)
16.0000 mg | Freq: Once | INTRAVENOUS | Status: AC
  Administered 2014-11-06: 16 mg via INTRAVENOUS

## 2014-11-06 MED ORDER — HEPARIN SOD (PORK) LOCK FLUSH 100 UNIT/ML IV SOLN
500.0000 [IU] | Freq: Once | INTRAVENOUS | Status: AC | PRN
  Administered 2014-11-06: 500 [IU]
  Filled 2014-11-06: qty 5

## 2014-11-06 MED ORDER — ACETAMINOPHEN 325 MG PO TABS
650.0000 mg | ORAL_TABLET | Freq: Once | ORAL | Status: AC
  Administered 2014-11-06: 650 mg via ORAL

## 2014-11-06 MED ORDER — DEXAMETHASONE SODIUM PHOSPHATE 20 MG/5ML IJ SOLN
INTRAMUSCULAR | Status: AC
  Filled 2014-11-06: qty 5

## 2014-11-06 MED ORDER — DEXAMETHASONE SODIUM PHOSPHATE 20 MG/5ML IJ SOLN
20.0000 mg | Freq: Once | INTRAMUSCULAR | Status: AC
  Administered 2014-11-06: 20 mg via INTRAVENOUS

## 2014-11-06 MED ORDER — SODIUM CHLORIDE 0.9 % IV SOLN
Freq: Once | INTRAVENOUS | Status: AC
  Administered 2014-11-06: 13:00:00 via INTRAVENOUS

## 2014-11-06 MED ORDER — DEXTROSE 5 % IV SOLN
40.0000 mg/m2 | Freq: Once | INTRAVENOUS | Status: AC
  Administered 2014-11-06: 70 mg via INTRAVENOUS
  Filled 2014-11-06: qty 7

## 2014-11-06 MED ORDER — SODIUM CHLORIDE 0.9 % IJ SOLN
10.0000 mL | INTRAMUSCULAR | Status: DC | PRN
  Administered 2014-11-06: 10 mL
  Filled 2014-11-06: qty 10

## 2014-11-06 MED ORDER — DIPHENHYDRAMINE HCL 25 MG PO CAPS
ORAL_CAPSULE | ORAL | Status: AC
  Filled 2014-11-06: qty 2

## 2014-11-06 MED ORDER — DIPHENHYDRAMINE HCL 25 MG PO CAPS
50.0000 mg | ORAL_CAPSULE | Freq: Once | ORAL | Status: AC
  Administered 2014-11-06: 50 mg via ORAL

## 2014-11-06 NOTE — Telephone Encounter (Signed)
, °

## 2014-11-06 NOTE — Patient Instructions (Signed)
[place] Discharge Instructions for Patients Receiving Chemotherapy  Today you received the following chemotherapy agents: Herceptin, Perjeta, Taxotere  To help prevent nausea and vomiting after your treatment, we encourage you to take your nausea medication as prescribed by your physician.   If you develop nausea and vomiting that is not controlled by your nausea medication, call the clinic.   BELOW ARE SYMPTOMS THAT SHOULD BE REPORTED IMMEDIATELY:  *FEVER GREATER THAN 100.5 F  *CHILLS WITH OR WITHOUT FEVER  NAUSEA AND VOMITING THAT IS NOT CONTROLLED WITH YOUR NAUSEA MEDICATION  *UNUSUAL SHORTNESS OF BREATH  *UNUSUAL BRUISING OR BLEEDING  TENDERNESS IN MOUTH AND THROAT WITH OR WITHOUT PRESENCE OF ULCERS  *URINARY PROBLEMS  *BOWEL PROBLEMS  UNUSUAL RASH Items with * indicate a potential emergency and should be followed up as soon as possible.  Feel free to call the clinic you have any questions or concerns. The clinic phone number is [phone].

## 2014-11-06 NOTE — Telephone Encounter (Signed)
Per staff message and POF I have scheduled appts. Advised scheduler of appts. JMW  

## 2014-11-06 NOTE — Progress Notes (Signed)
Patient Care Team: Jerlyn Ly, MD as PCP - General (Internal Medicine) Alphonsa Overall, MD as Consulting Physician (General Surgery) Rulon Eisenmenger, MD as Consulting Physician (Hematology and Oncology) Blair Promise, MD as Consulting Physician (Radiation Oncology)  DIAGNOSIS: Breast cancer of upper-inner quadrant of right female breast   Staging form: Breast, AJCC 7th Edition     Clinical: Stage IIA (T2, N0, cM0) - Signed by Rulon Eisenmenger, MD on 08/13/2014     Pathologic: No stage assigned - Unsigned   SUMMARY OF ONCOLOGIC HISTORY:   Breast cancer of upper-inner quadrant of right female breast   07/17/2014 Mammogram Right breast mass at 3:00 position by ultrasound measured 2.4 cm   08/06/2014 Initial Biopsy 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 With the dose reduction of Taxotere she appears to be tolerating this treatment fairly well. She had mild episode of nausea but overall her energy levels are adequate. She has poor appetite because of lack of taste. Denies any abdominal pain diarrhea or constipation.  REVIEW OF SYSTEMS:   Constitutional: Denies fevers, chills or abnormal weight loss , complains of fatigue Eyes: Denies blurriness of vision Ears, nose, mouth, throat, and face: Denies mucositis or sore throat Respiratory: Denies cough, dyspnea or  wheezes Cardiovascular: Denies palpitation, chest discomfort or lower extremity swelling Gastrointestinal:  Denies nausea, heartburn or change in bowel habits Skin: Denies abnormal skin rashes Lymphatics: Denies new lymphadenopathy or easy bruising Neurological:Denies numbness, tingling or new weaknesses Behavioral/Psych: Mood is stable, no new changes  All other systems were reviewed with the patient and are negative.  I have reviewed the past medical history, past surgical history, social history and family history with the patient and they are unchanged from previous note.  ALLERGIES:  has No Known Allergies.  MEDICATIONS:  Current Outpatient Prescriptions  Medication Sig Dispense Refill  . cyanocobalamin (,VITAMIN B-12,) 1000 MCG/ML injection Inject 1,000 mcg into the muscle every 30 (thirty) days.    Marland Kitchen dexamethasone (DECADRON) 4 MG tablet Take 1 tablet (4 mg total) by mouth 2 (two) times daily. Start the day before Taxotere. Then again the day after chemo for 3 days. 30 tablet 1  . HYDROcodone-acetaminophen (NORCO/VICODIN) 5-325 MG per tablet Take 1-2 tablets by mouth every 6 (six) hours as needed. 30 tablet 0  . lidocaine-prilocaine (EMLA) cream Apply 1 application topically as needed. 30 g 0  . LORazepam (ATIVAN) 0.5 MG tablet Take 1 tablet (0.5 mg total) by mouth every 8 (eight) hours as needed for anxiety. 30 tablet 0  . LYRICA 75 MG capsule Take 75 mg by mouth 3 (three) times daily as needed (for pain).   0  . Multiple Vitamin (MULTIVITAMIN WITH MINERALS) TABS tablet Take 1 tablet by mouth daily.    Marland Kitchen omeprazole (PRILOSEC) 20 MG capsule Take 1 capsule (20 mg total) by mouth daily. 30 capsule 2  . ondansetron (ZOFRAN) 8 MG tablet Take 1 tablet (8  mg total) by mouth every 8 (eight) hours as needed for nausea or vomiting. 30 tablet 1  . prochlorperazine (COMPAZINE) 10 MG tablet   0  . temazepam (RESTORIL) 30 MG capsule Take 30 mg by mouth at bedtime as needed for sleep.    . Vitamin  D, Ergocalciferol, (DRISDOL) 50000 UNITS CAPS Take 50,000 Units by mouth every 7 (seven) days. Takes on Thursday or Friday     No current facility-administered medications for this visit.   Facility-Administered Medications Ordered in Other Visits  Medication Dose Route Frequency Provider Last Rate Last Dose  . sodium chloride 0.9 % injection 10 mL  10 mL Intracatheter PRN Rulon Eisenmenger, MD   10 mL at 09/04/14 1601    PHYSICAL EXAMINATION: ECOG PERFORMANCE STATUS: 1 - Symptomatic but completely ambulatory  Filed Vitals:   11/06/14 1033  BP: 132/58  Pulse: 82  Temp: 97.8 F (36.6 C)  Resp: 18   Filed Weights   11/06/14 1033  Weight: 158 lb 14.4 oz (72.077 kg)    GENERAL:alert, no distress and comfortable SKIN: skin color, texture, turgor are normal, no rashes or significant lesions EYES: normal, Conjunctiva are pink and non-injected, sclera clear OROPHARYNX:no exudate, no erythema and lips, buccal mucosa, and tongue normal  NECK: supple, thyroid normal size, non-tender, without nodularity LYMPH:  no palpable lymphadenopathy in the cervical, axillary or inguinal LUNGS: clear to auscultation and percussion with normal breathing effort HEART: regular rate & rhythm and no murmurs and no lower extremity edema ABDOMEN:abdomen soft, non-tender and normal bowel sounds Musculoskeletal:no cyanosis of digits and no clubbing  NEURO: alert & oriented x 3 with fluent speech, no focal motor/sensory deficits  LABORATORY DATA:  I have reviewed the data as listed   Chemistry      Component Value Date/Time   NA 142 11/06/2014 0947   NA 138 09/10/2014 0526   K 4.0 11/06/2014 0947   K 3.9 09/10/2014 0526   CL 104 09/10/2014 0526   CO2 25 11/06/2014 0947   CO2 22 09/10/2014 0526   BUN 17.1 11/06/2014 0947   BUN 12 09/10/2014 0526   CREATININE 0.9 11/06/2014 0947   CREATININE 0.99 09/10/2014 0526      Component Value Date/Time   CALCIUM 9.2 11/06/2014 0947   CALCIUM 8.7 09/10/2014  0526   ALKPHOS 51 11/06/2014 0947   ALKPHOS 51 09/09/2014 0500   AST 23 11/06/2014 0947   AST 38* 09/09/2014 0500   ALT 17 11/06/2014 0947   ALT 40* 09/09/2014 0500   BILITOT 0.50 11/06/2014 0947   BILITOT 0.4 09/09/2014 0500       Lab Results  Component Value Date   WBC 4.5 11/06/2014   HGB 9.4* 11/06/2014   HCT 29.0* 11/06/2014   MCV 97.8 11/06/2014   PLT 326 11/06/2014   NEUTROABS 2.0 11/06/2014    ASSESSMENT & PLAN:  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 Hospitalization for pneumonia after cycle 1 ( patient had fevers up 102 and was found to have slight infiltrate on chest x-ray. Unclear if the fever was related to a true pneumonia versus Neulasta related pyrexia) 2. Chemotherapy-induced neutropenia: Resolved after carboplatin was discontinued and Taxotere dose reduced  3. Chemotherapy-induced anemia: Stable with a hemoglobin of 9.4  4. Chemotherapy-induced thrombocytopenia 5. Profound fatigue: Grade 3 dosage of Taxotere reduced to 40 mg/m from cycle 3 5. Diminished appetite  Neulasta has been discontinued after cycle 3  And her blood counts today are adequate for treatment ANC is 2000  Plan: Continue with reduction in dosage of Taxotere to 40 mg meter square (from cycle 3.) Return to clinic in 3 week for cycle 5  TCH Perjeta. monitoring closely for toxicities      Orders Placed This Encounter  Procedures  . CBC with Differential    Standing Status: Future     Number of Occurrences:      Standing Expiration Date: 11/06/2015  . Comprehensive metabolic panel (Cmet) - [facility]    Standing Status: Future     Number of Occurrences:      Standing Expiration Date: 11/06/2015   The patient has a good understanding of  the overall plan. she agrees with it. She will call with any problems that may develop before her next visit here.   Rulon Eisenmenger, MD

## 2014-11-06 NOTE — Assessment & Plan Note (Signed)
Right breast invasive mammary cancer most likely ductal phenotype grade 3, 2.4 cm mass at 3:00 position ER negative PR negative HER-2 positive ratio 2.75, Ki-67 80% clinical stage IIA, T2, N0, M0  Toxicities of chemotherapy: 1. Hospitalization for pneumonia after cycle 1 ( patient had fevers up 102 and was found to have slight infiltrate on chest x-ray. Unclear if the fever was related to a true pneumonia versus Neulasta related pyrexia) 2. Chemotherapy-induced neutropenia: Resolved after carboplatin was discontinued and Taxotere dose reduced  3. Chemotherapy-induced anemia: Stable with a hemoglobin of 9.4  4. Chemotherapy-induced thrombocytopenia 5. Profound fatigue: Grade 3 dosage of Taxotere reduced to 40 mg/m from cycle 3 5. Diminished appetite  I reviewed her blood work which showed leukocytosis related to prior Neulasta. I would discontinue Neulasta injections from subsequent cycles.  Plan: Continue with reduction in dosage of Taxotere to 40 mg meter square (from cycle 3.) Return to clinic in 3 week for cycle 5. monitoring closely for toxicities

## 2014-11-07 ENCOUNTER — Ambulatory Visit: Payer: PPO

## 2014-11-14 ENCOUNTER — Ambulatory Visit (HOSPITAL_BASED_OUTPATIENT_CLINIC_OR_DEPARTMENT_OTHER): Payer: PPO | Admitting: Hematology and Oncology

## 2014-11-14 ENCOUNTER — Telehealth: Payer: Self-pay | Admitting: Hematology and Oncology

## 2014-11-14 ENCOUNTER — Other Ambulatory Visit (HOSPITAL_BASED_OUTPATIENT_CLINIC_OR_DEPARTMENT_OTHER): Payer: PPO

## 2014-11-14 VITALS — BP 117/99 | HR 72 | Temp 97.5°F | Resp 18 | Ht 61.0 in | Wt 157.2 lb

## 2014-11-14 DIAGNOSIS — C50811 Malignant neoplasm of overlapping sites of right female breast: Secondary | ICD-10-CM

## 2014-11-14 DIAGNOSIS — D6959 Other secondary thrombocytopenia: Secondary | ICD-10-CM

## 2014-11-14 DIAGNOSIS — C50211 Malignant neoplasm of upper-inner quadrant of right female breast: Secondary | ICD-10-CM

## 2014-11-14 DIAGNOSIS — D6481 Anemia due to antineoplastic chemotherapy: Secondary | ICD-10-CM

## 2014-11-14 DIAGNOSIS — Z171 Estrogen receptor negative status [ER-]: Secondary | ICD-10-CM

## 2014-11-14 LAB — COMPREHENSIVE METABOLIC PANEL (CC13)
ALK PHOS: 50 U/L (ref 40–150)
ALT: 20 U/L (ref 0–55)
ANION GAP: 8 meq/L (ref 3–11)
AST: 25 U/L (ref 5–34)
Albumin: 3.6 g/dL (ref 3.5–5.0)
BUN: 16.9 mg/dL (ref 7.0–26.0)
CO2: 23 meq/L (ref 22–29)
Calcium: 9.2 mg/dL (ref 8.4–10.4)
Chloride: 111 mEq/L — ABNORMAL HIGH (ref 98–109)
Creatinine: 1 mg/dL (ref 0.6–1.1)
EGFR: 57 mL/min/{1.73_m2} — ABNORMAL LOW (ref >90)
Glucose: 95 mg/dl (ref 70–140)
Potassium: 4.3 mEq/L (ref 3.5–5.1)
Sodium: 143 mEq/L (ref 136–145)
Total Bilirubin: 0.36 mg/dL (ref 0.20–1.20)
Total Protein: 6.2 g/dL — ABNORMAL LOW (ref 6.4–8.3)

## 2014-11-14 LAB — CBC WITH DIFFERENTIAL/PLATELET
BASO%: 0.7 % (ref 0.0–2.0)
Basophils Absolute: 0 10*3/uL (ref 0.0–0.1)
EOS%: 0.5 % (ref 0.0–7.0)
Eosinophils Absolute: 0 10*3/uL (ref 0.0–0.5)
HEMATOCRIT: 29.3 % — AB (ref 34.8–46.6)
HEMOGLOBIN: 9.5 g/dL — AB (ref 11.6–15.9)
LYMPH%: 56.9 % — ABNORMAL HIGH (ref 14.0–49.7)
MCH: 31.8 pg (ref 25.1–34.0)
MCHC: 32.5 g/dL (ref 31.5–36.0)
MCV: 97.8 fL (ref 79.5–101.0)
MONO#: 0.3 10*3/uL (ref 0.1–0.9)
MONO%: 9.1 % (ref 0.0–14.0)
NEUT%: 32.8 % — ABNORMAL LOW (ref 38.4–76.8)
NEUTROS ABS: 0.9 10*3/uL — AB (ref 1.5–6.5)
Platelets: 268 10*3/uL (ref 145–400)
RBC: 3 10*6/uL — ABNORMAL LOW (ref 3.70–5.45)
RDW: 15 % — ABNORMAL HIGH (ref 11.2–14.5)
WBC: 2.9 10*3/uL — ABNORMAL LOW (ref 3.9–10.3)
lymph#: 1.6 10*3/uL (ref 0.9–3.3)

## 2014-11-14 NOTE — Progress Notes (Signed)
Patient Care Team: Jerlyn Ly, MD as PCP - General (Internal Medicine) Alphonsa Overall, MD as Consulting Physician (General Surgery) Rulon Eisenmenger, MD as Consulting Physician (Hematology and Oncology) Blair Promise, MD as Consulting Physician (Radiation Oncology)  DIAGNOSIS: Breast cancer of upper-inner quadrant of right female breast   Staging form: Breast, AJCC 7th Edition     Clinical: Stage IIA (T2, N0, cM0) - Signed by Rulon Eisenmenger, MD on 08/13/2014     Pathologic: No stage assigned - Unsigned   SUMMARY OF ONCOLOGIC HISTORY:   Breast cancer of upper-inner quadrant of right female breast   07/17/2014 Mammogram Right breast mass at 3:00 position by ultrasound measured 2.4 cm   08/06/2014 Initial Biopsy 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 Today is cycle #5. Carboplatin was discontinued after cycle 2 and Taxotere dose was reduced after cycle 3. She does not appear to have any new symptoms or concerns. Denies any nausea or vomiting denies any fevers or chills. Does feel fatigued.  REVIEW OF SYSTEMS:   Constitutional: Denies fevers, chills or abnormal weight loss Eyes: Denies blurriness of vision Ears, nose, mouth, throat, and face: Denies mucositis or sore throat Respiratory: Denies cough, dyspnea or wheezes Cardiovascular: Denies palpitation, chest discomfort or lower extremity swelling Gastrointestinal:   Denies nausea, heartburn or change in bowel habits Skin: Denies abnormal skin rashes Lymphatics: Denies new lymphadenopathy or easy bruising Neurological:Denies numbness, tingling or new weaknesses Behavioral/Psych: Mood is stable, no new changes  All other systems were reviewed with the patient and are negative.  I have reviewed the past medical history, past surgical history, social history and family history with the patient and they are unchanged from previous note.  ALLERGIES:  has No Known Allergies.  MEDICATIONS:  Current Outpatient Prescriptions  Medication Sig Dispense Refill  . cyanocobalamin (,VITAMIN B-12,) 1000 MCG/ML injection Inject 1,000 mcg into the muscle every 30 (thirty) days.    Marland Kitchen dexamethasone (DECADRON) 4 MG tablet Take 1 tablet (4 mg total) by mouth 2 (two) times daily. Start the day before Taxotere. Then again the day after chemo for 3 days. 30 tablet 1  . HYDROcodone-acetaminophen (NORCO/VICODIN) 5-325 MG per tablet Take 1-2 tablets by mouth every 6 (six) hours as needed. 30 tablet 0  . lidocaine-prilocaine (EMLA) cream Apply 1 application topically as needed. 30 g 0  . LORazepam (ATIVAN) 0.5 MG tablet Take 1 tablet (0.5 mg total) by mouth every 8 (eight) hours as needed for anxiety. 30 tablet 0  . LYRICA 75 MG capsule Take 75 mg by mouth 3 (three) times daily as needed (for pain).   0  . Multiple Vitamin (MULTIVITAMIN WITH MINERALS) TABS tablet Take 1 tablet by mouth daily.    Marland Kitchen omeprazole (PRILOSEC) 20 MG capsule Take 1 capsule (20 mg total) by mouth daily. 30 capsule 2  . ondansetron (ZOFRAN) 8 MG tablet Take 1 tablet (8 mg total) by mouth every 8 (eight) hours as needed for nausea or vomiting. 30 tablet 1  .  prochlorperazine (COMPAZINE) 10 MG tablet   0  . temazepam (RESTORIL) 30 MG capsule Take 30 mg by mouth at bedtime as needed for sleep.    . Vitamin D, Ergocalciferol, (DRISDOL) 50000 UNITS CAPS Take 50,000 Units by mouth every 7 (seven) days. Takes on  Thursday or Friday     No current facility-administered medications for this visit.   Facility-Administered Medications Ordered in Other Visits  Medication Dose Route Frequency Provider Last Rate Last Dose  . sodium chloride 0.9 % injection 10 mL  10 mL Intracatheter PRN Rulon Eisenmenger, MD   10 mL at 09/04/14 1601    PHYSICAL EXAMINATION: ECOG PERFORMANCE STATUS: 1 - Symptomatic but completely ambulatory  Filed Vitals:   11/14/14 0958  BP: 117/99  Pulse: 72  Temp: 97.5 F (36.4 C)  Resp: 18   Filed Weights   11/14/14 0958  Weight: 157 lb 3.2 oz (71.305 kg)    GENERAL:alert, no distress and comfortable SKIN: skin color, texture, turgor are normal, no rashes or significant lesions EYES: normal, Conjunctiva are pink and non-injected, sclera clear OROPHARYNX:no exudate, no erythema and lips, buccal mucosa, and tongue normal  NECK: supple, thyroid normal size, non-tender, without nodularity LYMPH:  no palpable lymphadenopathy in the cervical, axillary or inguinal LUNGS: clear to auscultation and percussion with normal breathing effort HEART: regular rate & rhythm and no murmurs and no lower extremity edema ABDOMEN:abdomen soft, non-tender and normal bowel sounds Musculoskeletal:no cyanosis of digits and no clubbing  NEURO: alert & oriented x 3 with fluent speech, no focal motor/sensory deficits LABORATORY DATA:  I have reviewed the data as listed   Chemistry      Component Value Date/Time   NA 142 11/06/2014 0947   NA 138 09/10/2014 0526   K 4.0 11/06/2014 0947   K 3.9 09/10/2014 0526   CL 104 09/10/2014 0526   CO2 25 11/06/2014 0947   CO2 22 09/10/2014 0526   BUN 17.1 11/06/2014 0947   BUN 12 09/10/2014 0526   CREATININE 0.9 11/06/2014 0947   CREATININE 0.99 09/10/2014 0526      Component Value Date/Time   CALCIUM 9.2 11/06/2014 0947   CALCIUM 8.7 09/10/2014 0526   ALKPHOS 51 11/06/2014 0947   ALKPHOS 51 09/09/2014 0500   AST 23 11/06/2014 0947   AST 38*  09/09/2014 0500   ALT 17 11/06/2014 0947   ALT 40* 09/09/2014 0500   BILITOT 0.50 11/06/2014 0947   BILITOT 0.4 09/09/2014 0500       Lab Results  Component Value Date   WBC 2.9* 11/14/2014   HGB 9.5* 11/14/2014   HCT 29.3* 11/14/2014   MCV 97.8 11/14/2014   PLT 268 11/14/2014   NEUTROABS 0.9* 11/14/2014    ASSESSMENT & PLAN:  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 Hospitalization for pneumonia after cycle 1 ( patient had fevers up 102 and was found to have slight infiltrate on chest x-ray. Unclear if the fever was related to a true pneumonia versus Neulasta related pyrexia) 2. Chemotherapy-induced neutropenia: Resolved after carboplatin was discontinued and Taxotere dose reduced  Because of profound side effects including tiredness, loss of appetite and taste, I will discontinue Taxotere from cycle 5, patient will get Herceptin Perjeta for cycle 5 and 6 followed  by Herceptin maintenance 3. Chemotherapy-induced anemia: Stable with a hemoglobin of 9.4  4. Chemotherapy-induced thrombocytopenia 5. Profound fatigue: Grade 3 dosage of Taxotere reduced to 40 mg/m from cycle 3 5. Diminished appetite  Neulasta has been discontinued after cycle 3   Plan: Continue with reduction in dosage of Taxotere to 40 mg meter square (from cycle 3.) Return to clinic in 2 weeks for cycle 5 Herceptin Perjeta.Patient will be seen with the last cycle. I will arrange for breast MRI following cycle 6 chemotherapy and tumor board presentation subsequently followed by surgery appointment.  monitoring closely for toxicities    No orders of the defined types were placed in this encounter.   The patient has a good understanding of the overall plan. she  agrees with it. She will call with any problems that may develop before her next visit here.   Rulon Eisenmenger, MD

## 2014-11-14 NOTE — Telephone Encounter (Signed)
, °

## 2014-11-14 NOTE — Assessment & Plan Note (Signed)
Right breast invasive mammary cancer most likely ductal phenotype grade 3, 2.4 cm mass at 3:00 position ER negative PR negative HER-2 positive ratio 2.75, Ki-67 80% clinical stage IIA, T2, N0, M0  Toxicities of chemotherapy: 1. Hospitalization for pneumonia after cycle 1 ( patient had fevers up 102 and was found to have slight infiltrate on chest x-ray. Unclear if the fever was related to a true pneumonia versus Neulasta related pyrexia) 2. Chemotherapy-induced neutropenia: Resolved after carboplatin was discontinued and Taxotere dose reduced  3. Chemotherapy-induced anemia: Stable with a hemoglobin of 9.4  4. Chemotherapy-induced thrombocytopenia 5. Profound fatigue: Grade 3 dosage of Taxotere reduced to 40 mg/m from cycle 3 5. Diminished appetite  Neulasta has been discontinued after cycle 3 And her blood counts have been adequate for treatment  Plan: Continue with reduction in dosage of Taxotere to 40 mg meter square (from cycle 3.) Return to clinic in 3 weeks for cycle 6 TH Perjeta.Patient will not need to be seen with the last cycle. I will arrange for breast MRI following cycle 6 chemotherapy and tumor board presentation subsequently followed by surgery appointment. Patient will come back in 3 weeks from cycle 6 for Herceptin maintenance at which time I will discussed at tumor board recommendations and review the MRI breast.  monitoring closely for toxicities

## 2014-11-20 ENCOUNTER — Ambulatory Visit: Payer: PPO

## 2014-11-20 ENCOUNTER — Other Ambulatory Visit: Payer: Self-pay | Admitting: *Deleted

## 2014-11-27 ENCOUNTER — Telehealth: Payer: Self-pay | Admitting: Hematology and Oncology

## 2014-11-27 ENCOUNTER — Other Ambulatory Visit (HOSPITAL_BASED_OUTPATIENT_CLINIC_OR_DEPARTMENT_OTHER): Payer: PPO

## 2014-11-27 ENCOUNTER — Ambulatory Visit (HOSPITAL_BASED_OUTPATIENT_CLINIC_OR_DEPARTMENT_OTHER): Payer: PPO | Admitting: Hematology and Oncology

## 2014-11-27 ENCOUNTER — Ambulatory Visit (HOSPITAL_BASED_OUTPATIENT_CLINIC_OR_DEPARTMENT_OTHER): Payer: PPO

## 2014-11-27 VITALS — BP 139/56 | HR 72 | Temp 97.2°F | Resp 18 | Ht 61.0 in | Wt 158.6 lb

## 2014-11-27 DIAGNOSIS — C50211 Malignant neoplasm of upper-inner quadrant of right female breast: Secondary | ICD-10-CM

## 2014-11-27 DIAGNOSIS — Z171 Estrogen receptor negative status [ER-]: Secondary | ICD-10-CM

## 2014-11-27 DIAGNOSIS — R5383 Other fatigue: Secondary | ICD-10-CM

## 2014-11-27 DIAGNOSIS — D6481 Anemia due to antineoplastic chemotherapy: Secondary | ICD-10-CM

## 2014-11-27 DIAGNOSIS — Z5112 Encounter for antineoplastic immunotherapy: Secondary | ICD-10-CM

## 2014-11-27 DIAGNOSIS — D6959 Other secondary thrombocytopenia: Secondary | ICD-10-CM

## 2014-11-27 LAB — CBC WITH DIFFERENTIAL/PLATELET
BASO%: 0.6 % (ref 0.0–2.0)
BASOS ABS: 0 10*3/uL (ref 0.0–0.1)
EOS%: 0.5 % (ref 0.0–7.0)
Eosinophils Absolute: 0 10*3/uL (ref 0.0–0.5)
HCT: 31 % — ABNORMAL LOW (ref 34.8–46.6)
HGB: 10 g/dL — ABNORMAL LOW (ref 11.6–15.9)
LYMPH%: 44.5 % (ref 14.0–49.7)
MCH: 31.5 pg (ref 25.1–34.0)
MCHC: 32.4 g/dL (ref 31.5–36.0)
MCV: 97.3 fL (ref 79.5–101.0)
MONO#: 0.5 10*3/uL (ref 0.1–0.9)
MONO%: 10.8 % (ref 0.0–14.0)
NEUT#: 1.9 10*3/uL (ref 1.5–6.5)
NEUT%: 43.6 % (ref 38.4–76.8)
Platelets: 240 10*3/uL (ref 145–400)
RBC: 3.19 10*6/uL — AB (ref 3.70–5.45)
RDW: 13.9 % (ref 11.2–14.5)
WBC: 4.4 10*3/uL (ref 3.9–10.3)
lymph#: 1.9 10*3/uL (ref 0.9–3.3)

## 2014-11-27 LAB — COMPREHENSIVE METABOLIC PANEL (CC13)
ALBUMIN: 3.5 g/dL (ref 3.5–5.0)
ALK PHOS: 48 U/L (ref 40–150)
ALT: 18 U/L (ref 0–55)
AST: 25 U/L (ref 5–34)
Anion Gap: 6 mEq/L (ref 3–11)
BUN: 16.4 mg/dL (ref 7.0–26.0)
CO2: 24 mEq/L (ref 22–29)
Calcium: 9.3 mg/dL (ref 8.4–10.4)
Chloride: 111 mEq/L — ABNORMAL HIGH (ref 98–109)
Creatinine: 0.9 mg/dL (ref 0.6–1.1)
EGFR: 59 mL/min/{1.73_m2} — ABNORMAL LOW (ref >90)
Glucose: 104 mg/dl (ref 70–140)
POTASSIUM: 3.8 meq/L (ref 3.5–5.1)
SODIUM: 140 meq/L (ref 136–145)
TOTAL PROTEIN: 6 g/dL — AB (ref 6.4–8.3)
Total Bilirubin: 0.31 mg/dL (ref 0.20–1.20)

## 2014-11-27 MED ORDER — ACETAMINOPHEN 325 MG PO TABS
650.0000 mg | ORAL_TABLET | Freq: Once | ORAL | Status: AC
  Administered 2014-11-27: 650 mg via ORAL

## 2014-11-27 MED ORDER — HEPARIN SOD (PORK) LOCK FLUSH 100 UNIT/ML IV SOLN
500.0000 [IU] | Freq: Once | INTRAVENOUS | Status: AC | PRN
  Administered 2014-11-27: 500 [IU]
  Filled 2014-11-27: qty 5

## 2014-11-27 MED ORDER — ACETAMINOPHEN 325 MG PO TABS
ORAL_TABLET | ORAL | Status: AC
  Filled 2014-11-27: qty 2

## 2014-11-27 MED ORDER — TRASTUZUMAB CHEMO INJECTION 440 MG
6.0000 mg/kg | Freq: Once | INTRAVENOUS | Status: AC
  Administered 2014-11-27: 462 mg via INTRAVENOUS
  Filled 2014-11-27: qty 22

## 2014-11-27 MED ORDER — SODIUM CHLORIDE 0.9 % IV SOLN
420.0000 mg | Freq: Once | INTRAVENOUS | Status: AC
  Administered 2014-11-27: 420 mg via INTRAVENOUS
  Filled 2014-11-27: qty 14

## 2014-11-27 MED ORDER — SODIUM CHLORIDE 0.9 % IV SOLN
Freq: Once | INTRAVENOUS | Status: AC
  Administered 2014-11-27: 10:00:00 via INTRAVENOUS

## 2014-11-27 MED ORDER — DIPHENHYDRAMINE HCL 25 MG PO CAPS
50.0000 mg | ORAL_CAPSULE | Freq: Once | ORAL | Status: AC
  Administered 2014-11-27: 50 mg via ORAL

## 2014-11-27 MED ORDER — SODIUM CHLORIDE 0.9 % IJ SOLN
10.0000 mL | INTRAMUSCULAR | Status: DC | PRN
  Administered 2014-11-27: 10 mL
  Filled 2014-11-27: qty 10

## 2014-11-27 MED ORDER — DIPHENHYDRAMINE HCL 25 MG PO CAPS
ORAL_CAPSULE | ORAL | Status: AC
  Filled 2014-11-27: qty 2

## 2014-11-27 NOTE — Telephone Encounter (Signed)
, °

## 2014-11-27 NOTE — Patient Instructions (Signed)
[facility] Discharge Instructions   Today you received the following: Herceptin and Perjeta  To help prevent nausea and vomiting after your treatment, we encourage you to take your nausea medication as directed.   If you develop nausea and vomiting that is not controlled by your nausea medication, call the clinic.   BELOW ARE SYMPTOMS THAT SHOULD BE REPORTED IMMEDIATELY:  *FEVER GREATER THAN 100.5 F  *CHILLS WITH OR WITHOUT FEVER  NAUSEA AND VOMITING THAT IS NOT CONTROLLED WITH YOUR NAUSEA MEDICATION  *UNUSUAL SHORTNESS OF BREATH  *UNUSUAL BRUISING OR BLEEDING  TENDERNESS IN MOUTH AND THROAT WITH OR WITHOUT PRESENCE OF ULCERS  *URINARY PROBLEMS  *BOWEL PROBLEMS  UNUSUAL RASH Items with * indicate a potential emergency and should be followed up as soon as possible.  Feel free to call the clinic you have any questions or concerns. The clinic phone number is [phone].

## 2014-11-27 NOTE — Assessment & Plan Note (Signed)
Right breast invasive mammary cancer most likely ductal phenotype grade 3, 2.4 cm mass at 3:00 position ER negative PR negative HER-2 positive ratio 2.75, Ki-67 80% clinical stage IIA, T2, N0, M0  Current treatment: Cycle 5 of Herceptin Perjeta (Taxotere discontinued after cycle 4 of carboplatin discontinued after cycle 1)  Toxicities of chemotherapy: 1. Hospitalization for pneumonia after cycle 1 ( patient had fevers up 102 and was found to have slight infiltrate on chest x-ray. Unclear if the fever was related to a true pneumonia versus Neulasta related pyrexia) 2. Chemotherapy-induced neutropenia: Resolved after carboplatin was discontinued and Taxotere dose reduced  Because of profound side effects including tiredness, loss of appetite and taste,Taxotere discontinued from cycle 5, patient will get Herceptin Perjeta for cycle 5 and 6 followed by Herceptin maintenance 3. Chemotherapy-induced anemia: Stable with a hemoglobin of 9.4  4. Chemotherapy-induced thrombocytopenia 5. Profound fatigue: Grade 3 dosage of Taxotere reduced to 40 mg/m from cycle 3 5. Diminished appetite  Neulasta has been discontinued after cycle 3   Plan:  1. Herceptin Perjeta alone without chemotherapy for cycle 5 and 6  2. breast MRI following cycle 6 and tumor board presentation subsequently followed by surgery appointment.   Monitoring closely for toxicities

## 2014-11-27 NOTE — Progress Notes (Signed)
Patient Care Team: Jerlyn Ly, MD as PCP - General (Internal Medicine) Alphonsa Overall, MD as Consulting Physician (General Surgery) Rulon Eisenmenger, MD as Consulting Physician (Hematology and Oncology) Blair Promise, MD as Consulting Physician (Radiation Oncology)  DIAGNOSIS: Breast cancer of upper-inner quadrant of right female breast   Staging form: Breast, AJCC 7th Edition     Clinical: Stage IIA (T2, N0, cM0) - Signed by Rulon Eisenmenger, MD on 08/13/2014     Pathologic: No stage assigned - Unsigned   SUMMARY OF ONCOLOGIC HISTORY:   Breast cancer of upper-inner quadrant of right female breast   07/17/2014 Mammogram Right breast mass at 3:00 position by ultrasound measured 2.4 cm   08/06/2014 Initial Biopsy 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 We discontinued chemotherapy after cycle 4 because she could not tolerate it with lot of side effects. She is continuing and finishing the Herceptin and Perjeta treatment for cycle 5 and 6. She reports that she is feeling a lot better since the chemotherapy has been stopped. Her taste is coming back. She is eating better energy levels have improved. Denies any nausea vomiting diarrhea or constipation.  REVIEW OF SYSTEMS:   Constitutional: Denies fevers, chills or abnormal weight loss Eyes: Denies blurriness of vision Ears, nose, mouth, throat, and face: Denies mucositis or  sore throat Respiratory: Denies cough, dyspnea or wheezes Cardiovascular: Denies palpitation, chest discomfort or lower extremity swelling Gastrointestinal:  Denies nausea, heartburn or change in bowel habits Skin: Denies abnormal skin rashes Lymphatics: Denies new lymphadenopathy or easy bruising Neurological:Denies numbness, tingling or new weaknesses Behavioral/Psych: Mood is stable, no new changes  All other systems were reviewed with the patient and are negative.  I have reviewed the past medical history, past surgical history, social history and family history with the patient and they are unchanged from previous note.  ALLERGIES:  has No Known Allergies.  MEDICATIONS:  Current Outpatient Prescriptions  Medication Sig Dispense Refill  . cyanocobalamin (,VITAMIN B-12,) 1000 MCG/ML injection Inject 1,000 mcg into the muscle every 30 (thirty) days.    Marland Kitchen dexamethasone (DECADRON) 4 MG tablet Take 1 tablet (4 mg total) by mouth 2 (two) times daily. Start the day before Taxotere. Then again the day after chemo for 3 days. 30 tablet 1  . HYDROcodone-acetaminophen (NORCO/VICODIN) 5-325 MG per tablet Take 1-2 tablets by mouth every 6 (six) hours as needed. 30 tablet 0  . lidocaine-prilocaine (EMLA) cream Apply 1 application topically as needed. 30 g 0  . LORazepam (ATIVAN) 0.5 MG tablet Take 1 tablet (0.5 mg total) by mouth every 8 (eight) hours as needed for anxiety. 30 tablet 0  . LYRICA 75 MG capsule Take 75 mg by mouth 3 (three) times daily as needed (for pain).   0  . Multiple Vitamin (MULTIVITAMIN WITH MINERALS) TABS tablet Take 1 tablet by mouth daily.    Marland Kitchen omeprazole (PRILOSEC) 20 MG capsule Take 1 capsule (20 mg total) by mouth daily. 30 capsule 2  .  ondansetron (ZOFRAN) 8 MG tablet Take 1 tablet (8 mg total) by mouth every 8 (eight) hours as needed for nausea or vomiting. 30 tablet 1  . prochlorperazine (COMPAZINE) 10 MG tablet   0  . temazepam (RESTORIL) 30 MG capsule Take 30 mg by  mouth at bedtime as needed for sleep.    . Vitamin D, Ergocalciferol, (DRISDOL) 50000 UNITS CAPS Take 50,000 Units by mouth every 7 (seven) days. Takes on Thursday or Friday     No current facility-administered medications for this visit.   Facility-Administered Medications Ordered in Other Visits  Medication Dose Route Frequency Provider Last Rate Last Dose  . sodium chloride 0.9 % injection 10 mL  10 mL Intracatheter PRN Sabas Sous, MD   10 mL at 09/04/14 1601    PHYSICAL EXAMINATION: ECOG PERFORMANCE STATUS: 2 - Symptomatic, <50% confined to bed  Filed Vitals:   11/27/14 0848  BP: 139/56  Pulse: 72  Temp: 97.2 F (36.2 C)  Resp: 18   Filed Weights   11/27/14 0848  Weight: 158 lb 9.6 oz (71.94 kg)    GENERAL:alert, no distress and comfortable SKIN: skin color, texture, turgor are normal, no rashes or significant lesions EYES: normal, Conjunctiva are pink and non-injected, sclera clear OROPHARYNX:no exudate, no erythema and lips, buccal mucosa, and tongue normal  NECK: supple, thyroid normal size, non-tender, without nodularity LYMPH:  no palpable lymphadenopathy in the cervical, axillary or inguinal LUNGS: clear to auscultation and percussion with normal breathing effort HEART: regular rate & rhythm and no murmurs and no lower extremity edema ABDOMEN:abdomen soft, non-tender and normal bowel sounds Musculoskeletal:no cyanosis of digits and no clubbing  NEURO: alert & oriented x 3 with fluent speech, no focal motor/sensory deficits LABORATORY DATA:  I have reviewed the data as listed   Chemistry      Component Value Date/Time   NA 140 11/27/2014 0749   NA 138 09/10/2014 0526   K 3.8 11/27/2014 0749   K 3.9 09/10/2014 0526   CL 104 09/10/2014 0526   CO2 24 11/27/2014 0749   CO2 22 09/10/2014 0526   BUN 16.4 11/27/2014 0749   BUN 12 09/10/2014 0526   CREATININE 0.9 11/27/2014 0749   CREATININE 0.99 09/10/2014 0526      Component Value Date/Time   CALCIUM 9.3  11/27/2014 0749   CALCIUM 8.7 09/10/2014 0526   ALKPHOS 48 11/27/2014 0749   ALKPHOS 51 09/09/2014 0500   AST 25 11/27/2014 0749   AST 38* 09/09/2014 0500   ALT 18 11/27/2014 0749   ALT 40* 09/09/2014 0500   BILITOT 0.31 11/27/2014 0749   BILITOT 0.4 09/09/2014 0500       Lab Results  Component Value Date   WBC 4.4 11/27/2014   HGB 10.0* 11/27/2014   HCT 31.0* 11/27/2014   MCV 97.3 11/27/2014   PLT 240 11/27/2014   NEUTROABS 1.9 11/27/2014   ASSESSMENT & PLAN:  Breast cancer of upper-inner quadrant of right female breast Right breast invasive mammary cancer most likely ductal phenotype grade 3, 2.4 cm mass at 3:00 position ER negative PR negative HER-2 positive ratio 2.75, Ki-67 80% clinical stage IIA, T2, N0, M0  Current treatment: Cycle 5 of Herceptin Perjeta (Taxotere discontinued after cycle 4 of carboplatin discontinued after cycle 1)  Toxicities of chemotherapy: 1. Hospitalization for pneumonia after cycle 1 ( patient had fevers up 102 and was found to have slight infiltrate on chest x-ray. Unclear if the fever was related to a true pneumonia  versus Neulasta related pyrexia) 2. Chemotherapy-induced neutropenia: Resolved after carboplatin was discontinued and Taxotere dose reduced  Because of profound side effects including tiredness, loss of appetite and taste,Taxotere discontinued from cycle 5, patient will get Herceptin Perjeta for cycle 5 and 6 followed by Herceptin maintenance 3. Chemotherapy-induced anemia: Stable with a hemoglobin of 9.4  4. Chemotherapy-induced thrombocytopenia 5. Profound fatigue: Grade 3 dosage of Taxotere reduced to 40 mg/m from cycle 3 5. Diminished appetite  Neulasta has been discontinued after cycle 3   Plan:  1. Herceptin Perjeta alone without chemotherapy for cycle 5 and 6  2. breast MRI following cycle 6 and tumor board presentation subsequently followed by surgery appointment.   Monitoring closely for toxicities    No  orders of the defined types were placed in this encounter.   The patient has a good understanding of the overall plan. she agrees with it. She will call with any problems that may develop before her next visit here.   Rulon Eisenmenger, MD

## 2014-11-30 ENCOUNTER — Inpatient Hospital Stay: Admission: RE | Admit: 2014-11-30 | Payer: PPO | Source: Ambulatory Visit

## 2014-12-18 ENCOUNTER — Ambulatory Visit (HOSPITAL_BASED_OUTPATIENT_CLINIC_OR_DEPARTMENT_OTHER): Payer: PPO

## 2014-12-18 ENCOUNTER — Telehealth: Payer: Self-pay | Admitting: Hematology and Oncology

## 2014-12-18 ENCOUNTER — Other Ambulatory Visit (HOSPITAL_BASED_OUTPATIENT_CLINIC_OR_DEPARTMENT_OTHER): Payer: PPO

## 2014-12-18 ENCOUNTER — Ambulatory Visit (HOSPITAL_BASED_OUTPATIENT_CLINIC_OR_DEPARTMENT_OTHER): Payer: PPO | Admitting: Hematology and Oncology

## 2014-12-18 VITALS — BP 108/60 | HR 62 | Temp 97.7°F | Resp 18 | Ht 61.0 in | Wt 152.7 lb

## 2014-12-18 DIAGNOSIS — C50111 Malignant neoplasm of central portion of right female breast: Secondary | ICD-10-CM

## 2014-12-18 DIAGNOSIS — C50211 Malignant neoplasm of upper-inner quadrant of right female breast: Secondary | ICD-10-CM

## 2014-12-18 DIAGNOSIS — Z5112 Encounter for antineoplastic immunotherapy: Secondary | ICD-10-CM

## 2014-12-18 DIAGNOSIS — R5383 Other fatigue: Secondary | ICD-10-CM

## 2014-12-18 DIAGNOSIS — D6481 Anemia due to antineoplastic chemotherapy: Secondary | ICD-10-CM

## 2014-12-18 DIAGNOSIS — D6959 Other secondary thrombocytopenia: Secondary | ICD-10-CM

## 2014-12-18 LAB — CBC WITH DIFFERENTIAL/PLATELET
BASO%: 0.2 % (ref 0.0–2.0)
Basophils Absolute: 0 10*3/uL (ref 0.0–0.1)
EOS ABS: 0.2 10*3/uL (ref 0.0–0.5)
EOS%: 4.2 % (ref 0.0–7.0)
HEMATOCRIT: 32.2 % — AB (ref 34.8–46.6)
HEMOGLOBIN: 10.6 g/dL — AB (ref 11.6–15.9)
LYMPH#: 2.1 10*3/uL (ref 0.9–3.3)
LYMPH%: 49.2 % (ref 14.0–49.7)
MCH: 32 pg (ref 25.1–34.0)
MCHC: 32.9 g/dL (ref 31.5–36.0)
MCV: 97.3 fL (ref 79.5–101.0)
MONO#: 0.4 10*3/uL (ref 0.1–0.9)
MONO%: 8.7 % (ref 0.0–14.0)
NEUT%: 37.7 % — AB (ref 38.4–76.8)
NEUTROS ABS: 1.6 10*3/uL (ref 1.5–6.5)
PLATELETS: 224 10*3/uL (ref 145–400)
RBC: 3.31 10*6/uL — ABNORMAL LOW (ref 3.70–5.45)
RDW: 12.7 % (ref 11.2–14.5)
WBC: 4.3 10*3/uL (ref 3.9–10.3)

## 2014-12-18 LAB — COMPREHENSIVE METABOLIC PANEL (CC13)
ALT: 11 U/L (ref 0–55)
AST: 18 U/L (ref 5–34)
Albumin: 3.4 g/dL — ABNORMAL LOW (ref 3.5–5.0)
Alkaline Phosphatase: 48 U/L (ref 40–150)
Anion Gap: 9 mEq/L (ref 3–11)
BUN: 16 mg/dL (ref 7.0–26.0)
CHLORIDE: 111 meq/L — AB (ref 98–109)
CO2: 25 mEq/L (ref 22–29)
Calcium: 9.5 mg/dL (ref 8.4–10.4)
Creatinine: 1 mg/dL (ref 0.6–1.1)
EGFR: 52 mL/min/{1.73_m2} — AB (ref >90)
Glucose: 97 mg/dl (ref 70–140)
POTASSIUM: 3.8 meq/L (ref 3.5–5.1)
Sodium: 144 mEq/L (ref 136–145)
Total Bilirubin: 0.31 mg/dL (ref 0.20–1.20)
Total Protein: 6 g/dL — ABNORMAL LOW (ref 6.4–8.3)

## 2014-12-18 MED ORDER — SODIUM CHLORIDE 0.9 % IJ SOLN
10.0000 mL | INTRAMUSCULAR | Status: DC | PRN
  Administered 2014-12-18: 10 mL
  Filled 2014-12-18: qty 10

## 2014-12-18 MED ORDER — DIPHENHYDRAMINE HCL 25 MG PO CAPS
50.0000 mg | ORAL_CAPSULE | Freq: Once | ORAL | Status: AC
  Administered 2014-12-18: 50 mg via ORAL

## 2014-12-18 MED ORDER — ACETAMINOPHEN 325 MG PO TABS
650.0000 mg | ORAL_TABLET | Freq: Once | ORAL | Status: AC
  Administered 2014-12-18: 650 mg via ORAL

## 2014-12-18 MED ORDER — ACETAMINOPHEN 325 MG PO TABS
ORAL_TABLET | ORAL | Status: AC
  Filled 2014-12-18: qty 2

## 2014-12-18 MED ORDER — HEPARIN SOD (PORK) LOCK FLUSH 100 UNIT/ML IV SOLN
500.0000 [IU] | Freq: Once | INTRAVENOUS | Status: AC | PRN
  Administered 2014-12-18: 500 [IU]
  Filled 2014-12-18: qty 5

## 2014-12-18 MED ORDER — SODIUM CHLORIDE 0.9 % IV SOLN
Freq: Once | INTRAVENOUS | Status: AC
  Administered 2014-12-18: 10:00:00 via INTRAVENOUS

## 2014-12-18 MED ORDER — TRASTUZUMAB CHEMO INJECTION 440 MG
420.0000 mg | Freq: Once | INTRAVENOUS | Status: AC
  Administered 2014-12-18: 420 mg via INTRAVENOUS
  Filled 2014-12-18: qty 20

## 2014-12-18 MED ORDER — DIPHENHYDRAMINE HCL 25 MG PO CAPS
ORAL_CAPSULE | ORAL | Status: AC
  Filled 2014-12-18: qty 2

## 2014-12-18 MED ORDER — SODIUM CHLORIDE 0.9 % IV SOLN
420.0000 mg | Freq: Once | INTRAVENOUS | Status: AC
  Administered 2014-12-18: 420 mg via INTRAVENOUS
  Filled 2014-12-18: qty 14

## 2014-12-18 NOTE — Telephone Encounter (Signed)
appts made and avs printed for pt  Kelli Owens °

## 2014-12-18 NOTE — Progress Notes (Signed)
Patient Care Team: Jerlyn Ly, MD as PCP - General (Internal Medicine) Alphonsa Overall, MD as Consulting Physician (General Surgery) Rulon Eisenmenger, MD as Consulting Physician (Hematology and Oncology) Blair Promise, MD as Consulting Physician (Radiation Oncology)  DIAGNOSIS: Breast cancer of upper-inner quadrant of right female breast   Staging form: Breast, AJCC 7th Edition     Clinical: Stage IIA (T2, N0, cM0) - Signed by Rulon Eisenmenger, MD on 08/13/2014     Pathologic: No stage assigned - Unsigned   SUMMARY OF ONCOLOGIC HISTORY:   Breast cancer of upper-inner quadrant of right female breast   07/17/2014 Mammogram Right breast mass at 3:00 position by ultrasound measured 2.4 cm   08/06/2014 Initial Biopsy 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 She had done well since we stopped chemotherapy and is just on Herceptin Perjeta. Her taste is coming back or appetite energy is coming back.  REVIEW OF SYSTEMS:   Constitutional: Denies fevers, chills or abnormal weight loss Eyes: Denies blurriness of vision Ears, nose, mouth, throat, and face: Denies mucositis or sore throat Respiratory: Denies cough, dyspnea or wheezes Cardiovascular: Denies palpitation, chest discomfort or lower extremity swelling Gastrointestinal:  Denies nausea, heartburn or change in bowel  habits Skin: Denies abnormal skin rashes Lymphatics: Denies new lymphadenopathy or easy bruising Neurological:Denies numbness, tingling or new weaknesses Behavioral/Psych: Mood is stable, no new changes  Breast:  denies any pain or lumps or nodules in either breasts All other systems were reviewed with the patient and are negative.  I have reviewed the past medical history, past surgical history, social history and family history with the patient and they are unchanged from previous note.  ALLERGIES:  has No Known Allergies.  MEDICATIONS:  Current Outpatient Prescriptions  Medication Sig Dispense Refill  . cyanocobalamin (,VITAMIN B-12,) 1000 MCG/ML injection Inject 1,000 mcg into the muscle every 30 (thirty) days.    Marland Kitchen dexamethasone (DECADRON) 4 MG tablet Take 1 tablet (4 mg total) by mouth 2 (two) times daily. Start the day before Taxotere. Then again the day after chemo for 3 days. 30 tablet 1  . HYDROcodone-acetaminophen (NORCO/VICODIN) 5-325 MG per tablet Take 1-2 tablets by mouth every 6 (six) hours as needed. 30 tablet 0  . lidocaine-prilocaine (EMLA) cream Apply 1 application topically as needed. 30 g 0  . LORazepam (ATIVAN) 0.5 MG tablet Take 1 tablet (0.5 mg total) by mouth every 8 (eight) hours as needed for anxiety. 30 tablet 0  . LYRICA 75 MG capsule Take 75 mg by mouth 3 (three) times daily as needed (for pain).   0  . Multiple Vitamin (MULTIVITAMIN WITH MINERALS) TABS tablet Take 1 tablet by mouth daily.    Marland Kitchen omeprazole (PRILOSEC) 20 MG capsule Take 1 capsule (20 mg total) by mouth daily. 30 capsule 2  . ondansetron (ZOFRAN) 8 MG tablet Take 1 tablet (8 mg total) by mouth every 8 (eight) hours as needed for  nausea or vomiting. 30 tablet 1  . prochlorperazine (COMPAZINE) 10 MG tablet   0  . temazepam (RESTORIL) 30 MG capsule Take 30 mg by mouth at bedtime as needed for sleep.    . Vitamin D, Ergocalciferol, (DRISDOL) 50000 UNITS CAPS Take 50,000 Units by mouth every 7 (seven)  days. Takes on Thursday or Friday     No current facility-administered medications for this visit.   Facility-Administered Medications Ordered in Other Visits  Medication Dose Route Frequency Provider Last Rate Last Dose  . sodium chloride 0.9 % injection 10 mL  10 mL Intracatheter PRN Rulon Eisenmenger, MD   10 mL at 09/04/14 1601    PHYSICAL EXAMINATION: ECOG PERFORMANCE STATUS: 1 - Symptomatic but completely ambulatory  Filed Vitals:   12/18/14 0845  BP: 108/60  Pulse: 62  Temp: 97.7 F (36.5 C)  Resp: 18   Filed Weights   12/18/14 0845  Weight: 152 lb 11.2 oz (69.264 kg)    GENERAL:alert, no distress and comfortable SKIN: skin color, texture, turgor are normal, no rashes or significant lesions EYES: normal, Conjunctiva are pink and non-injected, sclera clear OROPHARYNX:no exudate, no erythema and lips, buccal mucosa, and tongue normal  NECK: supple, thyroid normal size, non-tender, without nodularity LYMPH:  no palpable lymphadenopathy in the cervical, axillary or inguinal LUNGS: clear to auscultation and percussion with normal breathing effort HEART: regular rate & rhythm and no murmurs and no lower extremity edema ABDOMEN:abdomen soft, non-tender and normal bowel sounds Musculoskeletal:no cyanosis of digits and no clubbing  NEURO: alert & oriented x 3 with fluent speech, no focal motor/sensory deficits   LABORATORY DATA:  I have reviewed the data as listed   Chemistry      Component Value Date/Time   NA 144 12/18/2014 0825   NA 138 09/10/2014 0526   K 3.8 12/18/2014 0825   K 3.9 09/10/2014 0526   CL 104 09/10/2014 0526   CO2 25 12/18/2014 0825   CO2 22 09/10/2014 0526   BUN 16.0 12/18/2014 0825   BUN 12 09/10/2014 0526   CREATININE 1.0 12/18/2014 0825   CREATININE 0.99 09/10/2014 0526      Component Value Date/Time   CALCIUM 9.5 12/18/2014 0825   CALCIUM 8.7 09/10/2014 0526   ALKPHOS 48 12/18/2014 0825   ALKPHOS 51 09/09/2014 0500   AST 18 12/18/2014  0825   AST 38* 09/09/2014 0500   ALT 11 12/18/2014 0825   ALT 40* 09/09/2014 0500   BILITOT 0.31 12/18/2014 0825   BILITOT 0.4 09/09/2014 0500       Lab Results  Component Value Date   WBC 4.3 12/18/2014   HGB 10.6* 12/18/2014   HCT 32.2* 12/18/2014   MCV 97.3 12/18/2014   PLT 224 12/18/2014   NEUTROABS 1.6 12/18/2014    ASSESSMENT & PLAN:  Breast cancer of upper-inner quadrant of right female breast Right breast invasive mammary cancer most likely ductal phenotype grade 3, 2.4 cm mass at 3:00 position ER negative PR negative HER-2 positive ratio 2.75, Ki-67 80% clinical stage IIA, T2, N0, M0  Current treatment: Cycle 6 of Herceptin Perjeta (Taxotere discontinued after cycle 4; carboplatin discontinued after cycle 1)  Toxicities of chemotherapy: 1. Hospitalization for pneumonia after cycle 1 ( patient had fevers up 102 and was found to have slight infiltrate on chest x-ray. Unclear if the fever was related to a true pneumonia versus Neulasta related pyrexia) 2. Chemotherapy-induced neutropenia: Resolved after carboplatin was discontinued and Taxotere dose reduced  Because of  profound side effects including tiredness, loss of appetite and taste,Taxotere discontinued from cycle 5, patient will get Herceptin Perjeta for cycle 5 and 6 followed by Herceptin maintenance 3. Chemotherapy-induced anemia: Stable with a hemoglobin of 9.4  4. Chemotherapy-induced thrombocytopenia 5. Profound fatigue: Grade 3 dosage of Taxotere reduced to 40 mg/m from cycle 3 and discontinued after cycle 4 5. Diminished appetite  Plan:  1. Herceptin Perjeta alone without chemotherapy for cycle 5 and 6, today is cycle 6  2. breast MRI 12/25/14 and tumor board presentation subsequently followed by surgery appointment.   Monitoring closely for toxicities. Return to clinic after MRI to discuss the result.    Orders Placed This Encounter  Procedures  . 2D Echocardiogram without contrast    Standing  Status: Future     Number of Occurrences:      Standing Expiration Date: 12/18/2015    Scheduling Instructions:     PROVIDERS If this is NOT for EF then please REASON that  needs COMPLETE STUDY    Order Specific Question:  Type of Echo    Answer:  Complete    Order Specific Question:  Reason for Exam    Answer:  On Herceptin heart eval    Order Specific Question:  Where should this test be performed    Answer:  [facility]   The patient has a good understanding of the overall plan. she agrees with it. She will call with any problems that may develop before her next visit here.   Rulon Eisenmenger, MD

## 2014-12-18 NOTE — Assessment & Plan Note (Signed)
Right breast invasive mammary cancer most likely ductal phenotype grade 3, 2.4 cm mass at 3:00 position ER negative PR negative HER-2 positive ratio 2.75, Ki-67 80% clinical stage IIA, T2, N0, M0  Current treatment: Cycle 6 of Herceptin Perjeta (Taxotere discontinued after cycle 4; carboplatin discontinued after cycle 1)  Toxicities of chemotherapy: 1. Hospitalization for pneumonia after cycle 1 ( patient had fevers up 102 and was found to have slight infiltrate on chest x-ray. Unclear if the fever was related to a true pneumonia versus Neulasta related pyrexia) 2. Chemotherapy-induced neutropenia: Resolved after carboplatin was discontinued and Taxotere dose reduced  Because of profound side effects including tiredness, loss of appetite and taste,Taxotere discontinued from cycle 5, patient will get Herceptin Perjeta for cycle 5 and 6 followed by Herceptin maintenance 3. Chemotherapy-induced anemia: Stable with a hemoglobin of 9.4  4. Chemotherapy-induced thrombocytopenia 5. Profound fatigue: Grade 3 dosage of Taxotere reduced to 40 mg/m from cycle 3 and discontinued after cycle 4 5. Diminished appetite  Plan:  1. Herceptin Perjeta alone without chemotherapy for cycle 5 and 6, today is cycle 6  2. breast MRI 12/25/14 and tumor board presentation subsequently followed by surgery appointment.   Monitoring closely for toxicities. Return to clinic after MRI to discuss the result.

## 2014-12-18 NOTE — Patient Instructions (Signed)
[facility] Discharge Instructions for Patients Receiving Chemotherapy  Today you received the following chemotherapy agents Hercptin/Perjeta  To help prevent nausea and vomiting after your treatment, we encourage you to take your nausea medication    If you develop nausea and vomiting that is not controlled by your nausea medication, call the clinic.   BELOW ARE SYMPTOMS THAT SHOULD BE REPORTED IMMEDIATELY:  *FEVER GREATER THAN 100.5 F  *CHILLS WITH OR WITHOUT FEVER  NAUSEA AND VOMITING THAT IS NOT CONTROLLED WITH YOUR NAUSEA MEDICATION  *UNUSUAL SHORTNESS OF BREATH  *UNUSUAL BRUISING OR BLEEDING  TENDERNESS IN MOUTH AND THROAT WITH OR WITHOUT PRESENCE OF ULCERS  *URINARY PROBLEMS  *BOWEL PROBLEMS  UNUSUAL RASH Items with * indicate a potential emergency and should be followed up as soon as possible.  Feel free to call the clinic you have any questions or concerns. The clinic phone number is [phone].

## 2014-12-19 ENCOUNTER — Other Ambulatory Visit: Payer: Self-pay | Admitting: Hematology and Oncology

## 2014-12-19 DIAGNOSIS — C50211 Malignant neoplasm of upper-inner quadrant of right female breast: Secondary | ICD-10-CM

## 2014-12-25 ENCOUNTER — Ambulatory Visit
Admission: RE | Admit: 2014-12-25 | Discharge: 2014-12-25 | Disposition: A | Discharge status: Home / Self Care | Payer: PPO | Source: Ambulatory Visit | Attending: Hematology and Oncology | Admitting: Hematology and Oncology

## 2014-12-25 DIAGNOSIS — C50211 Malignant neoplasm of upper-inner quadrant of right female breast: Secondary | ICD-10-CM

## 2014-12-25 MED ORDER — GADOBENATE DIMEGLUMINE 529 MG/ML IV SOLN
14.0000 mL | Freq: Once | INTRAVENOUS | Status: AC | PRN
  Administered 2014-12-25: 14 mL via INTRAVENOUS

## 2014-12-26 ENCOUNTER — Ambulatory Visit: Payer: PPO | Admitting: Hematology and Oncology

## 2014-12-26 ENCOUNTER — Other Ambulatory Visit: Payer: PPO

## 2014-12-26 ENCOUNTER — Other Ambulatory Visit (INDEPENDENT_AMBULATORY_CARE_PROVIDER_SITE_OTHER): Payer: Self-pay | Admitting: Surgery

## 2014-12-26 DIAGNOSIS — C50911 Malignant neoplasm of unspecified site of right female breast: Secondary | ICD-10-CM

## 2014-12-30 ENCOUNTER — Other Ambulatory Visit (INDEPENDENT_AMBULATORY_CARE_PROVIDER_SITE_OTHER): Payer: Self-pay | Admitting: Surgery

## 2014-12-30 DIAGNOSIS — C50911 Malignant neoplasm of unspecified site of right female breast: Secondary | ICD-10-CM

## 2014-12-31 ENCOUNTER — Telehealth: Payer: Self-pay | Admitting: *Deleted

## 2014-12-31 NOTE — Telephone Encounter (Signed)
Received office notes from [place] [facility], sent to scan.

## 2015-01-01 ENCOUNTER — Other Ambulatory Visit: Payer: Self-pay | Admitting: Hematology and Oncology

## 2015-01-01 ENCOUNTER — Telehealth: Payer: Self-pay | Admitting: *Deleted

## 2015-01-01 ENCOUNTER — Telehealth: Payer: Self-pay | Admitting: Hematology and Oncology

## 2015-01-01 NOTE — Telephone Encounter (Signed)
PT. SEES DR.NEWMAN ON 01/12/15 AND IS SCHEDULED FOR SURGERY ON 01/13/15. DOES PT. NEED TO KEEP HER 01/08/15 APPOINTMENTS AT THIS OFFICE WITH LAB, MD VISIT, AND INFUSION? THIS NOTE WAS ROUTED TO [place].

## 2015-01-01 NOTE — Telephone Encounter (Signed)
Left VMM for patient to keep appts scheduled for 3/24.

## 2015-01-01 NOTE — Telephone Encounter (Signed)
Patient called in and left a message to check  To see if she needs to keep an appointment with dr Lindi Adie and perjeta due to also having surgery soon with dr Lucia Gaskins.  Voicemail was transferred to triage

## 2015-01-05 ENCOUNTER — Encounter (HOSPITAL_BASED_OUTPATIENT_CLINIC_OR_DEPARTMENT_OTHER): Payer: Self-pay | Admitting: *Deleted

## 2015-01-05 NOTE — Progress Notes (Signed)
No labs needed

## 2015-01-06 ENCOUNTER — Telehealth: Payer: Self-pay | Admitting: *Deleted

## 2015-01-06 NOTE — Telephone Encounter (Signed)
Ms. Kelli Owens called to ask if she should keep her appointment of 01/08/15 for lab/MD/Herceptin.  Advised her that she should do so.  She said she didn't know why she had to receive that if she had finished chemo.  Explained to patient about Her 2 positivity and the use of Herceptin for a full year.  She was appreciative, said she understood, and will keep scheduled appointments.

## 2015-01-07 ENCOUNTER — Other Ambulatory Visit: Payer: Self-pay | Admitting: *Deleted

## 2015-01-08 ENCOUNTER — Other Ambulatory Visit (HOSPITAL_BASED_OUTPATIENT_CLINIC_OR_DEPARTMENT_OTHER): Payer: PPO

## 2015-01-08 ENCOUNTER — Ambulatory Visit (HOSPITAL_BASED_OUTPATIENT_CLINIC_OR_DEPARTMENT_OTHER): Payer: PPO | Admitting: Hematology and Oncology

## 2015-01-08 ENCOUNTER — Ambulatory Visit (HOSPITAL_BASED_OUTPATIENT_CLINIC_OR_DEPARTMENT_OTHER): Payer: PPO

## 2015-01-08 ENCOUNTER — Other Ambulatory Visit: Payer: Self-pay | Admitting: Hematology and Oncology

## 2015-01-08 ENCOUNTER — Telehealth: Payer: Self-pay | Admitting: Hematology and Oncology

## 2015-01-08 VITALS — BP 139/46 | HR 59 | Temp 97.4°F | Resp 18 | Ht 61.0 in | Wt 158.9 lb

## 2015-01-08 DIAGNOSIS — C50111 Malignant neoplasm of central portion of right female breast: Secondary | ICD-10-CM

## 2015-01-08 DIAGNOSIS — D6481 Anemia due to antineoplastic chemotherapy: Secondary | ICD-10-CM

## 2015-01-08 DIAGNOSIS — R630 Anorexia: Secondary | ICD-10-CM

## 2015-01-08 DIAGNOSIS — D6959 Other secondary thrombocytopenia: Secondary | ICD-10-CM

## 2015-01-08 DIAGNOSIS — Z5112 Encounter for antineoplastic immunotherapy: Secondary | ICD-10-CM

## 2015-01-08 DIAGNOSIS — C50211 Malignant neoplasm of upper-inner quadrant of right female breast: Secondary | ICD-10-CM

## 2015-01-08 DIAGNOSIS — D701 Agranulocytosis secondary to cancer chemotherapy: Secondary | ICD-10-CM

## 2015-01-08 DIAGNOSIS — R5383 Other fatigue: Secondary | ICD-10-CM

## 2015-01-08 DIAGNOSIS — C50911 Malignant neoplasm of unspecified site of right female breast: Secondary | ICD-10-CM

## 2015-01-08 LAB — CBC WITH DIFFERENTIAL/PLATELET
BASO%: 0.4 % (ref 0.0–2.0)
BASOS ABS: 0 10*3/uL (ref 0.0–0.1)
EOS%: 7.4 % — AB (ref 0.0–7.0)
Eosinophils Absolute: 0.4 10*3/uL (ref 0.0–0.5)
HCT: 31.8 % — ABNORMAL LOW (ref 34.8–46.6)
HGB: 10.3 g/dL — ABNORMAL LOW (ref 11.6–15.9)
LYMPH%: 51.5 % — AB (ref 14.0–49.7)
MCH: 30.9 pg (ref 25.1–34.0)
MCHC: 32.5 g/dL (ref 31.5–36.0)
MCV: 95 fL (ref 79.5–101.0)
MONO#: 0.4 10*3/uL (ref 0.1–0.9)
MONO%: 8.8 % (ref 0.0–14.0)
NEUT#: 1.5 10*3/uL (ref 1.5–6.5)
NEUT%: 31.9 % — AB (ref 38.4–76.8)
Platelets: 232 10*3/uL (ref 145–400)
RBC: 3.35 10*6/uL — ABNORMAL LOW (ref 3.70–5.45)
RDW: 12.6 % (ref 11.2–14.5)
WBC: 4.8 10*3/uL (ref 3.9–10.3)
lymph#: 2.5 10*3/uL (ref 0.9–3.3)

## 2015-01-08 LAB — COMPREHENSIVE METABOLIC PANEL (CC13)
ALT: 15 U/L (ref 0–55)
ANION GAP: 8 meq/L (ref 3–11)
AST: 18 U/L (ref 5–34)
Albumin: 3.5 g/dL (ref 3.5–5.0)
Alkaline Phosphatase: 64 U/L (ref 40–150)
BUN: 22.2 mg/dL (ref 7.0–26.0)
CO2: 25 mEq/L (ref 22–29)
Calcium: 9.3 mg/dL (ref 8.4–10.4)
Chloride: 110 mEq/L — ABNORMAL HIGH (ref 98–109)
Creatinine: 1 mg/dL (ref 0.6–1.1)
EGFR: 57 mL/min/{1.73_m2} — ABNORMAL LOW (ref >90)
Glucose: 83 mg/dl (ref 70–140)
POTASSIUM: 4.1 meq/L (ref 3.5–5.1)
Sodium: 142 mEq/L (ref 136–145)
TOTAL PROTEIN: 6.1 g/dL — AB (ref 6.4–8.3)
Total Bilirubin: 0.2 mg/dL (ref 0.20–1.20)

## 2015-01-08 MED ORDER — HEPARIN SOD (PORK) LOCK FLUSH 100 UNIT/ML IV SOLN
500.0000 [IU] | Freq: Once | INTRAVENOUS | Status: AC | PRN
  Administered 2015-01-08: 500 [IU]
  Filled 2015-01-08: qty 5

## 2015-01-08 MED ORDER — ACETAMINOPHEN 325 MG PO TABS
650.0000 mg | ORAL_TABLET | Freq: Once | ORAL | Status: AC
  Administered 2015-01-08: 650 mg via ORAL

## 2015-01-08 MED ORDER — DIPHENHYDRAMINE HCL 25 MG PO CAPS
50.0000 mg | ORAL_CAPSULE | Freq: Once | ORAL | Status: AC
  Administered 2015-01-08: 50 mg via ORAL

## 2015-01-08 MED ORDER — DIPHENHYDRAMINE HCL 25 MG PO CAPS
ORAL_CAPSULE | ORAL | Status: AC
  Filled 2015-01-08: qty 2

## 2015-01-08 MED ORDER — SODIUM CHLORIDE 0.9 % IV SOLN
6.0000 mg/kg | Freq: Once | INTRAVENOUS | Status: AC
  Administered 2015-01-08: 420 mg via INTRAVENOUS
  Filled 2015-01-08: qty 20

## 2015-01-08 MED ORDER — ACETAMINOPHEN 325 MG PO TABS
ORAL_TABLET | ORAL | Status: AC
  Filled 2015-01-08: qty 2

## 2015-01-08 MED ORDER — SODIUM CHLORIDE 0.9 % IJ SOLN
10.0000 mL | INTRAMUSCULAR | Status: DC | PRN
  Administered 2015-01-08: 10 mL
  Filled 2015-01-08: qty 10

## 2015-01-08 MED ORDER — SODIUM CHLORIDE 0.9 % IV SOLN
Freq: Once | INTRAVENOUS | Status: AC
  Administered 2015-01-08: 10:00:00 via INTRAVENOUS

## 2015-01-08 NOTE — Progress Notes (Signed)
Echo not done in March as to be scheduled.  Palma Holter RN notified and will order pt echo to be done.  Pt notified.

## 2015-01-08 NOTE — Progress Notes (Signed)
OK to treat with Echo results from 08/2014 per Dr. Lindi Adie.

## 2015-01-08 NOTE — Telephone Encounter (Signed)
per pof ot sch pt appt-gave pt copy of sch °

## 2015-01-08 NOTE — Assessment & Plan Note (Signed)
Right breast invasive mammary cancer most likely ductal phenotype grade 3, 2.4 cm mass at 3:00 position ER negative PR negative HER-2 positive ratio 2.75, Ki-67 80% clinical stage IIA, T2, N0, M0  Treatment summary: Neoadjuvant chemotherapy started 09/04/2014 with [facility] completed Cycle 6 of Herceptin Perjeta as of 12/18/2014 (Taxotere discontinued after cycle 4; carboplatin discontinued after cycle 1)  Toxicities of chemotherapy:Hospitalization for pneumonia after cycle 1, chemotherapy-induced neutropenia, chemotherapy induced anemia, chemotherapy induced thrombocytopenia, grade 3 fatigue, diminished appetite  Radiology review: Breast MRI post neoadjuvant chemotherapy showed no evidence of disease. Plan: 1. Surgery appointment to undergo lumpectomy 2. Followed by radiation therapy 3. Continue with maintenance Herceptin every 3 weeks  Monitoring closely for toxicities.

## 2015-01-08 NOTE — Patient Instructions (Signed)
[facility] Discharge Instructions for Patients Receiving Chemotherapy  Today you received the following chemotherapy agents Herceptin.  To help prevent nausea and vomiting after your treatment, we encourage you to take your nausea medication as prescribed by your doctor.   If you develop nausea and vomiting that is not controlled by your nausea medication, call the clinic.   BELOW ARE SYMPTOMS THAT SHOULD BE REPORTED IMMEDIATELY:  *FEVER GREATER THAN 100.5 F  *CHILLS WITH OR WITHOUT FEVER  NAUSEA AND VOMITING THAT IS NOT CONTROLLED WITH YOUR NAUSEA MEDICATION  *UNUSUAL SHORTNESS OF BREATH  *UNUSUAL BRUISING OR BLEEDING  TENDERNESS IN MOUTH AND THROAT WITH OR WITHOUT PRESENCE OF ULCERS  *URINARY PROBLEMS  *BOWEL PROBLEMS  UNUSUAL RASH Items with * indicate a potential emergency and should be followed up as soon as possible.  Feel free to call the clinic you have any questions or concerns. The clinic phone number is [phone].  Please show the [facility] at check-in to the Emergency Department and triage nurse.

## 2015-01-08 NOTE — Progress Notes (Signed)
Patient Care Team: Crist Infante, MD as PCP - General (Internal Medicine) Alphonsa Overall, MD as Consulting Physician (General Surgery) Nicholas Lose, MD as Consulting Physician (Hematology and Oncology) Gery Pray, MD as Consulting Physician (Radiation Oncology)  DIAGNOSIS: Breast cancer of upper-inner quadrant of right female breast   Staging form: Breast, AJCC 7th Edition     Clinical: Stage IIA (T2, N0, cM0) - Signed by Rulon Eisenmenger, MD on 08/13/2014     Pathologic: No stage assigned - Unsigned   SUMMARY OF ONCOLOGIC HISTORY:   Breast cancer of upper-inner quadrant of right female breast   07/17/2014 Mammogram Right breast mass at 3:00 position by ultrasound measured 2.4 cm   08/06/2014 Initial Biopsy 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 ratio 2.75, Ki-67 80%   09/04/2014 -  Neo-Adjuvant Chemotherapy Neoadjuvant Taxotere, carboplatin, Herceptin and Perjeta, plan is for 6 cycles followed by Herceptin maintenance for a year; carboplatin discontinued after cycle 2, Taxotere dose reduced with cycle 3   09/06/2014 - 09/11/2014 Hospital Admission Hospitalization for pneumonia   12/25/2014 Breast MRI Post neoadjuvant MRI revealed no residual enhancement or mass in the area of the known right breast cancer    CHIEF COMPLIANT: Cycle 1 maintenance Herceptin  INTERVAL HISTORY: Kelli Owens is a 74 year old lady with above-mentioned history of HER-2 positive right breast cancer treated with neoadjuvant chemotherapy and had a complete response based on breast MRI. She is here today to discuss MRI results as well as to start a maintenance Herceptin treatment. She is feeling well with without any new problems or concerns. She is eating better. Her bowels are normalizing. Denies any shortness of breath.  REVIEW OF SYSTEMS:   Constitutional: Denies fevers, chills or abnormal weight loss Eyes: Denies blurriness of vision Ears, nose, mouth, throat, and face: Denies  mucositis or sore throat Respiratory: Denies cough, dyspnea or wheezes Cardiovascular: Denies palpitation, chest discomfort or lower extremity swelling Gastrointestinal:  Denies nausea, heartburn or change in bowel habits Skin: Denies abnormal skin rashes Lymphatics: Denies new lymphadenopathy or easy bruising Neurological:Denies numbness, tingling or new weaknesses Behavioral/Psych: Mood is stable, no new changes  All other systems were reviewed with the patient and are negative.  I have reviewed the past medical history, past surgical history, social history and family history with the patient and they are unchanged from previous note.  ALLERGIES:  has No Known Allergies.  MEDICATIONS:  Current Outpatient Prescriptions  Medication Sig Dispense Refill  . cyanocobalamin (,VITAMIN B-12,) 1000 MCG/ML injection Inject 1,000 mcg into the muscle every 30 (thirty) days.    Marland Kitchen lidocaine-prilocaine (EMLA) cream Apply 1 application topically as needed. 30 g 0  . LORazepam (ATIVAN) 0.5 MG tablet Take 1 tablet (0.5 mg total) by mouth every 8 (eight) hours as needed for anxiety. 30 tablet 0  . LYRICA 75 MG capsule Take 75 mg by mouth 3 (three) times daily as needed (for pain).   0  . Multiple Vitamin (MULTIVITAMIN WITH MINERALS) TABS tablet Take 1 tablet by mouth daily.    Marland Kitchen omeprazole (PRILOSEC) 20 MG capsule Take 1 capsule (20 mg total) by mouth daily. 30 capsule 2  . ondansetron (ZOFRAN) 8 MG tablet Take 1 tablet (8 mg total) by mouth every 8 (eight) hours as needed for nausea or vomiting. 30 tablet 1  . prochlorperazine (COMPAZINE) 10 MG tablet   0  . temazepam (RESTORIL) 30 MG capsule Take 30 mg by mouth at bedtime as needed for sleep.    Marland Kitchen  Vitamin D, Ergocalciferol, (DRISDOL) 50000 UNITS CAPS Take 50,000 Units by mouth every 7 (seven) days. Takes on Thursday or Friday     No current facility-administered medications for this visit.    PHYSICAL EXAMINATION: ECOG PERFORMANCE STATUS: 1 -  Symptomatic but completely ambulatory  Filed Vitals:   01/08/15 0924  BP: 139/46  Pulse: 59  Temp: 97.4 F (36.3 C)  Resp: 18   Filed Weights   01/08/15 0924  Weight: 158 lb 14.4 oz (72.077 kg)    GENERAL:alert, no distress and comfortable SKIN: skin color, texture, turgor are normal, no rashes or significant lesions EYES: normal, Conjunctiva are pink and non-injected, sclera clear OROPHARYNX:no exudate, no erythema and lips, buccal mucosa, and tongue normal  NECK: supple, thyroid normal size, non-tender, without nodularity LYMPH:  no palpable lymphadenopathy in the cervical, axillary or inguinal LUNGS: clear to auscultation and percussion with normal breathing effort HEART: regular rate & rhythm and no murmurs and no lower extremity edema ABDOMEN:abdomen soft, non-tender and normal bowel sounds Musculoskeletal:no cyanosis of digits and no clubbing  NEURO: alert & oriented x 3 with fluent speech, no focal motor/sensory deficits   LABORATORY DATA:  I have reviewed the data as listed   Chemistry      Component Value Date/Time   NA 142 01/08/2015 0904   NA 138 09/10/2014 0526   K 4.1 01/08/2015 0904   K 3.9 09/10/2014 0526   CL 104 09/10/2014 0526   CO2 25 01/08/2015 0904   CO2 22 09/10/2014 0526   BUN 22.2 01/08/2015 0904   BUN 12 09/10/2014 0526   CREATININE 1.0 01/08/2015 0904   CREATININE 0.99 09/10/2014 0526      Component Value Date/Time   CALCIUM 9.3 01/08/2015 0904   CALCIUM 8.7 09/10/2014 0526   ALKPHOS 64 01/08/2015 0904   ALKPHOS 51 09/09/2014 0500   AST 18 01/08/2015 0904   AST 38* 09/09/2014 0500   ALT 15 01/08/2015 0904   ALT 40* 09/09/2014 0500   BILITOT 0.20 01/08/2015 0904   BILITOT 0.4 09/09/2014 0500       Lab Results  Component Value Date   WBC 4.8 01/08/2015   HGB 10.3* 01/08/2015   HCT 31.8* 01/08/2015   MCV 95.0 01/08/2015   PLT 232 01/08/2015   NEUTROABS 1.5 01/08/2015     RADIOGRAPHIC STUDIES: I have personally reviewed the  radiology reports and agreed with their findings. MRI breast showed complete response  ASSESSMENT & PLAN:  Breast cancer of upper-inner quadrant of right female breast Right breast invasive mammary cancer most likely ductal phenotype grade 3, 2.4 cm mass at 3:00 position ER negative PR negative HER-2 positive ratio 2.75, Ki-67 80% clinical stage IIA, T2, N0, M0  Treatment summary: Neoadjuvant chemotherapy started 09/04/2014 with TCHP completed Cycle 6 of Herceptin Perjeta as of 12/18/2014 (Taxotere discontinued after cycle 4; carboplatin discontinued after cycle 1)  Toxicities of chemotherapy:Hospitalization for pneumonia after cycle 1, chemotherapy-induced neutropenia, chemotherapy induced anemia, chemotherapy induced thrombocytopenia, grade 3 fatigue, diminished appetite  Radiology review: Breast MRI post neoadjuvant chemotherapy showed no evidence of disease. Plan: 1. Surgery appointment to undergo lumpectomy 2. Followed by radiation therapy 3. Continue with maintenance Herceptin every 3 weeks  Monitoring closely for toxicities.    No orders of the defined types were placed in this encounter.   The patient has a good understanding of the overall plan. she agrees with it. She will call with any problems that may develop before her next visit here.  Rulon Eisenmenger, MD

## 2015-01-12 ENCOUNTER — Other Ambulatory Visit: Payer: Self-pay | Admitting: *Deleted

## 2015-01-12 ENCOUNTER — Ambulatory Visit
Admission: RE | Admit: 2015-01-12 | Discharge: 2015-01-12 | Disposition: A | Discharge status: Home / Self Care | Payer: PPO | Source: Ambulatory Visit | Attending: Surgery | Admitting: Surgery

## 2015-01-12 DIAGNOSIS — C50911 Malignant neoplasm of unspecified site of right female breast: Secondary | ICD-10-CM

## 2015-01-12 NOTE — H&P (Signed)
Kelli Owens 12/26/2014 1:28 PM Location: [facility] Patient #: [id number] DOB: 1940-12-03 Married / Language: English / Race: White Female  History of Present Illness: Patient words: breast cancer.  The patient is a 74 year old female who presents with breast cancer. Kelli Owens is a 74 y.o. (DOB: [birth year]) white female whose primary care physician is Jerlyn Ly, MD.  She was seen at the [facility] for right breast cancer. Treating oncologist - Gudena/Kinard She is accompanied with her husband  She has completed neo adjuvant chemo tx. She did well. After her first treatment, she was hospitalized for 5 days with a febrile illness. There is mention of pneumonia in the computer, but she disputes this. Otherwise, she has done well. She had an MRI yesterday (12/25/2014) which shows complete resolution of the tumor. She is ready for right breast lumpectomy and right axillary sentinel lymph node biopsy.   History of breast cancer: She had her routine mammogram and there was a mass found in her right breast. She had a hysterectomy at age 31. She took hormones early after surgery, but not recently. No family history of breast cancer. She had a sister who had colon ca. Her husband has an impant for parkinson's disease at [facility]. Dr. Clinton Sawyer suggested she see someone there for the cancer. 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 For now she is here. Right breast cancer, 3 o'clock Path - 08/06/2014 ([id number]) - IDC, Her2Neu - positive. ER/PR - negative.  Plan:1) Right lumpectomy and right axillary SLNBx  Past Medical History: 1. DJD of back  Social history: Her husband is with her. He has a history of Parkinson's disease and is treated at [facility]. She has one son.   Other Problems Elbert Ewings, CMA; 12/26/2014 1:29 PM) Arthritis Back Pain Breast Cancer Gastroesophageal Reflux Disease  Past Surgical History Elbert Ewings, CMA; 12/26/2014 1:29  PM) Cataract Surgery Bilateral. Hysterectomy (not due to cancer) - Complete  Diagnostic Studies History Elbert Ewings, CMA; 12/26/2014 1:29 PM) Colonoscopy 1-5 years ago Mammogram within last year  Allergies Elbert Ewings, CMA; 12/26/2014 1:29 PM) No Known Drug Allergies03/08/2015  Medication History Elbert Ewings, CMA; 12/26/2014 1:31 PM) Cyanocobalamin (1000MCG/ML Solution, Injection) Active. Decadron (4MG  Tablet, Oral) Active. Multiple Vitamin (Oral) Active. PriLOSEC (20MG  Capsule DR, Oral) Active. Zofran (8MG  Tablet, Oral) Active. Compazine (10MG  Tablet, Oral) Active. Restoril (30MG  Capsule, Oral) Active. Drisdol (50000UNIT Capsule, Oral) Active. Medications Reconciled  Social History Elbert Ewings, [place]; 12/26/2014 1:29 PM) Caffeine use Coffee, Tea. No alcohol use Tobacco use Never smoker.  Family History Elbert Ewings, [place]; 12/26/2014 1:29 PM) Arthritis Mother. Colon Cancer Sister. Colon Polyps Sister. Diabetes Mellitus Father. Hypertension Mother. Kidney Disease Mother. Rectal Cancer Sister.  Pregnancy / Birth History Elbert Ewings, CMA; 12/26/2014 1:29 PM) Age at menarche 40 years, 40 years. Gravida 1 Maternal age 33-30 Para 1  Review of Systems Fenton Malling. Lucia Gaskins MD; 12/26/2014 1:44 PM) General Not Present- Appetite Loss, Chills, Fatigue, Fever, Night Sweats, Weight Gain and Weight Loss. Skin Not Present- Change in Wart/Mole, Dryness, Hives, Jaundice, New Lesions, Non-Healing Wounds, Rash and Ulcer. HEENT Present- Seasonal Allergies. Not Present- Earache, Hearing Loss, Hoarseness, Nose Bleed, Oral Ulcers, Ringing in the Ears, Sinus Pain, Sore Throat, Visual Disturbances, Wears glasses/contact lenses and Yellow Eyes. Respiratory Not Present- Bloody sputum, Chronic Cough, Difficulty Breathing, Snoring and Wheezing. Breast Present- Breast Mass. Not Present- Breast Pain, Nipple Discharge and Skin Changes. Cardiovascular Not Present- Chest Pain,  Difficulty Breathing Lying Down, Leg Cramps, Palpitations, Rapid  Heart Rate, Shortness of Breath and Swelling of Extremities. Gastrointestinal Not Present- Abdominal Pain, Bloating, Bloody Stool, Change in Bowel Habits, Chronic diarrhea, Constipation, Difficulty Swallowing, Excessive gas, Gets full quickly at meals, Hemorrhoids, Indigestion, Nausea, Rectal Pain and Vomiting. Female Genitourinary Not Present- Frequency, Nocturia, Painful Urination, Pelvic Pain and Urgency. Musculoskeletal Not Present- Back Pain, Joint Pain, Joint Stiffness, Muscle Pain, Muscle Weakness and Swelling of Extremities. Neurological Not Present- Decreased Memory, Fainting, Headaches, Numbness, Seizures, Tingling, Tremor, Trouble walking and Weakness. Psychiatric Not Present- Anxiety, Bipolar, Change in Sleep Pattern, Depression, Fearful and Frequent crying. Endocrine Present- Hair Changes. Not Present- Cold Intolerance, Excessive Hunger, Heat Intolerance, Hot flashes and New Diabetes. Hematology Not Present- Easy Bruising, Excessive bleeding, Gland problems, HIV and Persistent Infections.   Vitals Elbert Ewings CMA; 12/26/2014 1:31 PM) 12/26/2014 1:31 PM Weight: 157.8 lb Height: 61in Body Surface Area: 1.76 m Body Mass Index: 29.82 kg/m Temp.: 97.37F(Temporal)  Pulse: 81 (Regular)  Resp.: 16 (Unlabored)  BP: 138/78 (Sitting, Left Arm, Standard)  Physical Exam: General: Older WNWF who is alert and generally healthy appearing. She is wearing a hat today and has no hair. HEENT: Normal. Pupils equal. Neck: Supple. No mass. No thyroid mass. Lymph Nodes: No supraclavicular, cervical, or axillary nodes.  Lungs: Clear to auscultation and symmetric breath sounds. Heart: RRR. No murmur or rub.  Breast: Right: The mass at 3 o'clock is gone. I feel no mass in the breast. Left: No mass. She has a power port in the upper inner left breast.  Abdomen: Soft. No mass. No tenderness. No hernia. Normal bowel  sounds. No abdominal scars.  Extremities: Good strength and ROM in upper and lower extremities. Neurologic: Grossly intact to motor and sensory function. Psychiatric: Has normal mood and affect. Behavior is normal.  Assessment & Plan: 1.  BREAST CANCER, STAGE 2, RIGHT (174.9  C50.911)  Story: Right breast cancer, 3 o'clock  Path - 08/06/2014 ([id number]) - IDC, Her2Neu - positive 2.4 x 1.5 x 1.8 cm on MRI - 08/06/2014  She completed neoadjuvant tx - Taxotere, carboplatin, Herceptin and Perjeta  Treating oncologist - Gudena/Kinard  Impression: MRI of breast 12/25/2014 - No residual disease  Plan Right lumpectomy (seed loc) and right axillary SLNBx  Schedule for Surgery  Alphonsa Overall, MD, [facility] Surgery Pager: [phone] Office phone:  [phone]

## 2015-01-13 ENCOUNTER — Encounter (HOSPITAL_BASED_OUTPATIENT_CLINIC_OR_DEPARTMENT_OTHER): Payer: Self-pay | Admitting: *Deleted

## 2015-01-13 ENCOUNTER — Ambulatory Visit
Admission: RE | Admit: 2015-01-13 | Discharge: 2015-01-13 | Disposition: A | Discharge status: Home / Self Care | Payer: PPO | Source: Ambulatory Visit | Attending: Surgery | Admitting: Surgery

## 2015-01-13 ENCOUNTER — Ambulatory Visit (HOSPITAL_BASED_OUTPATIENT_CLINIC_OR_DEPARTMENT_OTHER)
Admission: RE | Admit: 2015-01-13 | Discharge: 2015-01-13 | Disposition: A | Discharge status: Home / Self Care | Payer: PPO | Source: Ambulatory Visit | Attending: Surgery | Admitting: Surgery

## 2015-01-13 ENCOUNTER — Encounter (HOSPITAL_BASED_OUTPATIENT_CLINIC_OR_DEPARTMENT_OTHER)
Admission: RE | Disposition: A | Discharge status: Home / Self Care | Payer: Self-pay | Source: Ambulatory Visit | Attending: Surgery

## 2015-01-13 ENCOUNTER — Ambulatory Visit (HOSPITAL_BASED_OUTPATIENT_CLINIC_OR_DEPARTMENT_OTHER): Payer: PPO | Admitting: Anesthesiology

## 2015-01-13 ENCOUNTER — Ambulatory Visit (HOSPITAL_COMMUNITY)
Admission: RE | Admit: 2015-01-13 | Discharge: 2015-01-13 | Disposition: A | Discharge status: Home / Self Care | Payer: PPO | Source: Ambulatory Visit | Attending: Surgery | Admitting: Surgery

## 2015-01-13 DIAGNOSIS — N6021 Fibroadenosis of right breast: Secondary | ICD-10-CM | POA: Diagnosis not present

## 2015-01-13 DIAGNOSIS — Z7952 Long term (current) use of systemic steroids: Secondary | ICD-10-CM | POA: Insufficient documentation

## 2015-01-13 DIAGNOSIS — M1990 Unspecified osteoarthritis, unspecified site: Secondary | ICD-10-CM | POA: Insufficient documentation

## 2015-01-13 DIAGNOSIS — M479 Spondylosis, unspecified: Secondary | ICD-10-CM | POA: Diagnosis not present

## 2015-01-13 DIAGNOSIS — Z79899 Other long term (current) drug therapy: Secondary | ICD-10-CM | POA: Insufficient documentation

## 2015-01-13 DIAGNOSIS — N6011 Diffuse cystic mastopathy of right breast: Secondary | ICD-10-CM | POA: Insufficient documentation

## 2015-01-13 DIAGNOSIS — C50911 Malignant neoplasm of unspecified site of right female breast: Secondary | ICD-10-CM | POA: Diagnosis present

## 2015-01-13 DIAGNOSIS — Z90710 Acquired absence of both cervix and uterus: Secondary | ICD-10-CM | POA: Insufficient documentation

## 2015-01-13 DIAGNOSIS — Z9221 Personal history of antineoplastic chemotherapy: Secondary | ICD-10-CM | POA: Insufficient documentation

## 2015-01-13 DIAGNOSIS — K219 Gastro-esophageal reflux disease without esophagitis: Secondary | ICD-10-CM | POA: Insufficient documentation

## 2015-01-13 DIAGNOSIS — R921 Mammographic calcification found on diagnostic imaging of breast: Secondary | ICD-10-CM | POA: Insufficient documentation

## 2015-01-13 HISTORY — PX: BREAST LUMPECTOMY WITH NEEDLE LOCALIZATION AND AXILLARY SENTINEL LYMPH NODE BX: SHX5760

## 2015-01-13 LAB — POCT HEMOGLOBIN-HEMACUE: HEMOGLOBIN: 11.4 g/dL — AB (ref 12.0–15.0)

## 2015-01-13 SURGERY — BREAST LUMPECTOMY WITH RADIOACTIVE SEED AND SENTINEL LYMPH NODE BIOPSY
Anesthesia: General | Site: Breast | Laterality: Right

## 2015-01-13 MED ORDER — HYDROCODONE-ACETAMINOPHEN 5-325 MG PO TABS: 1.0000 | ORAL_TABLET | Freq: Four times a day (QID) | ORAL | Status: DC | PRN

## 2015-01-13 MED ORDER — BUPIVACAINE-EPINEPHRINE (PF) 0.5% -1:200000 IJ SOLN
INTRAMUSCULAR | Status: DC | PRN
  Administered 2015-01-13: 30 mL

## 2015-01-13 MED ORDER — SODIUM CHLORIDE 0.9 % IJ SOLN
INTRAMUSCULAR | Status: AC
  Filled 2015-01-13: qty 10

## 2015-01-13 MED ORDER — HYDROMORPHONE HCL 1 MG/ML IJ SOLN
0.2500 mg | INTRAMUSCULAR | Status: DC | PRN
  Administered 2015-01-13 (×3): 0.5 mg via INTRAVENOUS

## 2015-01-13 MED ORDER — FENTANYL CITRATE 0.05 MG/ML IJ SOLN
INTRAMUSCULAR | Status: AC
  Filled 2015-01-13: qty 2

## 2015-01-13 MED ORDER — CHLORHEXIDINE GLUCONATE 4 % EX LIQD: 1.0000 "application " | Freq: Once | CUTANEOUS | Status: DC

## 2015-01-13 MED ORDER — BUPIVACAINE HCL 0.25 % IJ SOLN
INTRAMUSCULAR | Status: DC | PRN
  Administered 2015-01-13: 10 mL

## 2015-01-13 MED ORDER — LIDOCAINE HCL (CARDIAC) 20 MG/ML IV SOLN
INTRAVENOUS | Status: DC | PRN
  Administered 2015-01-13: 100 mg via INTRAVENOUS

## 2015-01-13 MED ORDER — PROPOFOL 10 MG/ML IV BOLUS
INTRAVENOUS | Status: DC | PRN
  Administered 2015-01-13: 170 mg via INTRAVENOUS
  Administered 2015-01-13: 30 mg via INTRAVENOUS

## 2015-01-13 MED ORDER — ONDANSETRON HCL 4 MG/2ML IJ SOLN: 4.0000 mg | Freq: Once | INTRAMUSCULAR | Status: DC | PRN

## 2015-01-13 MED ORDER — FENTANYL CITRATE 0.05 MG/ML IJ SOLN
INTRAMUSCULAR | Status: AC
  Filled 2015-01-13: qty 4

## 2015-01-13 MED ORDER — TECHNETIUM TC 99M SULFUR COLLOID FILTERED
1.0000 | Freq: Once | INTRAVENOUS | Status: AC | PRN
  Administered 2015-01-13: 1 via INTRADERMAL

## 2015-01-13 MED ORDER — MIDAZOLAM HCL 2 MG/2ML IJ SOLN
1.0000 mg | INTRAMUSCULAR | Status: DC | PRN
  Administered 2015-01-13: 2 mg via INTRAVENOUS

## 2015-01-13 MED ORDER — CEFAZOLIN SODIUM-DEXTROSE 2-3 GM-% IV SOLR
INTRAVENOUS | Status: AC
Start: 2015-01-13 — End: 2015-01-13
  Filled 2015-01-13: qty 50

## 2015-01-13 MED ORDER — BUPIVACAINE HCL (PF) 0.25 % IJ SOLN
INTRAMUSCULAR | Status: AC
  Filled 2015-01-13: qty 30

## 2015-01-13 MED ORDER — HYDROMORPHONE HCL 1 MG/ML IJ SOLN
INTRAMUSCULAR | Status: AC
  Filled 2015-01-13: qty 1

## 2015-01-13 MED ORDER — EPHEDRINE SULFATE 50 MG/ML IJ SOLN
INTRAMUSCULAR | Status: DC | PRN
  Administered 2015-01-13: 10 mg via INTRAVENOUS

## 2015-01-13 MED ORDER — MEPERIDINE HCL 25 MG/ML IJ SOLN: 6.2500 mg | INTRAMUSCULAR | Status: DC | PRN

## 2015-01-13 MED ORDER — METHYLENE BLUE 1 % INJ SOLN
INTRAMUSCULAR | Status: AC
  Filled 2015-01-13: qty 10

## 2015-01-13 MED ORDER — FENTANYL CITRATE 0.05 MG/ML IJ SOLN
50.0000 ug | INTRAMUSCULAR | Status: DC | PRN
  Administered 2015-01-13: 100 ug via INTRAVENOUS

## 2015-01-13 MED ORDER — BUPIVACAINE-EPINEPHRINE (PF) 0.5% -1:200000 IJ SOLN
INTRAMUSCULAR | Status: AC
  Filled 2015-01-13: qty 30

## 2015-01-13 MED ORDER — ONDANSETRON HCL 4 MG/2ML IJ SOLN
INTRAMUSCULAR | Status: DC | PRN
  Administered 2015-01-13: 4 mg via INTRAVENOUS

## 2015-01-13 MED ORDER — LACTATED RINGERS IV SOLN
INTRAVENOUS | Status: DC
  Administered 2015-01-13 (×2): via INTRAVENOUS

## 2015-01-13 MED ORDER — MIDAZOLAM HCL 2 MG/2ML IJ SOLN
INTRAMUSCULAR | Status: AC
  Filled 2015-01-13: qty 2

## 2015-01-13 MED ORDER — CEFAZOLIN SODIUM-DEXTROSE 2-3 GM-% IV SOLR
2.0000 g | INTRAVENOUS | Status: AC
  Administered 2015-01-13: 2 g via INTRAVENOUS

## 2015-01-13 MED ORDER — DEXAMETHASONE SODIUM PHOSPHATE 4 MG/ML IJ SOLN
INTRAMUSCULAR | Status: DC | PRN
  Administered 2015-01-13: 10 mg via INTRAVENOUS

## 2015-01-13 MED ORDER — FENTANYL CITRATE 0.05 MG/ML IJ SOLN
INTRAMUSCULAR | Status: DC | PRN
  Administered 2015-01-13 (×4): 25 ug via INTRAVENOUS

## 2015-01-13 SURGICAL SUPPLY — 52 items
APL SKNCLS STERI-STRIP NONHPOA (GAUZE/BANDAGES/DRESSINGS) ×1
BENZOIN TINCTURE PRP APPL 2/3 (GAUZE/BANDAGES/DRESSINGS) ×2 IMPLANT
BINDER BREAST LRG (GAUZE/BANDAGES/DRESSINGS) ×2 IMPLANT
BINDER BREAST MEDIUM (GAUZE/BANDAGES/DRESSINGS) IMPLANT
BINDER BREAST XLRG (GAUZE/BANDAGES/DRESSINGS) IMPLANT
BINDER BREAST XXLRG (GAUZE/BANDAGES/DRESSINGS) IMPLANT
BLADE HEX COATED 2.75 (ELECTRODE) ×2 IMPLANT
BLADE SURG 10 STRL SS (BLADE) ×1 IMPLANT
BLADE SURG 15 STRL LF DISP TIS (BLADE) ×1 IMPLANT
BLADE SURG 15 STRL SS (BLADE) ×3
CANISTER SUC SOCK COL 7IN (MISCELLANEOUS) IMPLANT
CANISTER SUCT 1200ML W/VALVE (MISCELLANEOUS) ×3 IMPLANT
CHLORAPREP W/TINT 26ML (MISCELLANEOUS) ×5 IMPLANT
CLIP TI WIDE RED SMALL 6 (CLIP) ×2 IMPLANT
CLOSURE WOUND 1/4X4 (GAUZE/BANDAGES/DRESSINGS)
COVER BACK TABLE 60X90IN (DRAPES) ×3 IMPLANT
COVER MAYO STAND STRL (DRAPES) ×3 IMPLANT
COVER PROBE W GEL 5X96 (DRAPES) ×3 IMPLANT
DECANTER SPIKE VIAL GLASS SM (MISCELLANEOUS) IMPLANT
DEVICE DUBIN W/COMP PLATE 8390 (MISCELLANEOUS) ×3 IMPLANT
DRAPE LAPAROTOMY 100X72 PEDS (DRAPES) ×1 IMPLANT
DRAPE UTILITY XL STRL (DRAPES) ×3 IMPLANT
DRSG PAD ABDOMINAL 8X10 ST (GAUZE/BANDAGES/DRESSINGS) IMPLANT
ELECT COATED BLADE 2.86 ST (ELECTRODE) ×3 IMPLANT
ELECT REM PT RETURN 9FT ADLT (ELECTROSURGICAL) ×3
ELECTRODE REM PT RTRN 9FT ADLT (ELECTROSURGICAL) ×1 IMPLANT
GAUZE SPONGE 4X4 12PLY STRL (GAUZE/BANDAGES/DRESSINGS) ×2 IMPLANT
GLOVE SURG SIGNA 7.5 PF LTX (GLOVE) ×3 IMPLANT
GOWN STRL REUS W/ TWL LRG LVL3 (GOWN DISPOSABLE) ×1 IMPLANT
GOWN STRL REUS W/ TWL XL LVL3 (GOWN DISPOSABLE) ×1 IMPLANT
GOWN STRL REUS W/TWL LRG LVL3 (GOWN DISPOSABLE) ×3
GOWN STRL REUS W/TWL XL LVL3 (GOWN DISPOSABLE) ×3
KIT MARKER MARGIN INK (KITS) ×3 IMPLANT
LIQUID BAND (GAUZE/BANDAGES/DRESSINGS) ×3 IMPLANT
NDL HYPO 25X1 1.5 SAFETY (NEEDLE) ×1 IMPLANT
NEEDLE HYPO 25X1 1.5 SAFETY (NEEDLE) ×3 IMPLANT
NS IRRIG 1000ML POUR BTL (IV SOLUTION) IMPLANT
PACK BASIN DAY SURGERY FS (CUSTOM PROCEDURE TRAY) ×3 IMPLANT
PENCIL BUTTON HOLSTER BLD 10FT (ELECTRODE) ×3 IMPLANT
SHEET MEDIUM DRAPE 40X70 STRL (DRAPES) ×2 IMPLANT
SLEEVE SCD COMPRESS KNEE MED (MISCELLANEOUS) ×3 IMPLANT
SPONGE GAUZE 4X4 12PLY STER LF (GAUZE/BANDAGES/DRESSINGS) ×2 IMPLANT
SPONGE LAP 18X18 X RAY DECT (DISPOSABLE) ×5 IMPLANT
STRIP CLOSURE SKIN 1/4X4 (GAUZE/BANDAGES/DRESSINGS) IMPLANT
SUT MON AB 5-0 PS2 18 (SUTURE) ×3 IMPLANT
SUT VICRYL 3-0 CR8 SH (SUTURE) ×5 IMPLANT
SYR CONTROL 10ML LL (SYRINGE) ×3 IMPLANT
TOWEL OR 17X24 6PK STRL BLUE (TOWEL DISPOSABLE) ×3 IMPLANT
TOWEL OR NON WOVEN STRL DISP B (DISPOSABLE) ×3 IMPLANT
TUBE CONNECTING 20'X1/4 (TUBING) ×1
TUBE CONNECTING 20X1/4 (TUBING) ×2 IMPLANT
YANKAUER SUCT BULB TIP NO VENT (SUCTIONS) ×3 IMPLANT

## 2015-01-13 NOTE — Anesthesia Preprocedure Evaluation (Signed)
Anesthesia Evaluation  Patient identified by MRN, date of birth, ID band Patient awake    Reviewed: Allergy & Precautions, NPO status , Patient's Chart, lab work & pertinent test results  Airway Mallampati: I  TM Distance: >3 FB Neck ROM: Full    Dental   Pulmonary          Cardiovascular     Neuro/Psych    GI/Hepatic GERD-  Medicated and Controlled,  Endo/Other    Renal/GU      Musculoskeletal   Abdominal   Peds  Hematology   Anesthesia Other Findings   Reproductive/Obstetrics                             Anesthesia Physical Anesthesia Plan  ASA: II  Anesthesia Plan: General   Post-op Pain Management:    Induction: Intravenous  Airway Management Planned: LMA  Additional Equipment:   Intra-op Plan:   Post-operative Plan: Extubation in OR  Informed Consent: I have reviewed the patients History and Physical, chart, labs and discussed the procedure including the risks, benefits and alternatives for the proposed anesthesia with the patient or authorized representative who has indicated his/her understanding and acceptance.     Plan Discussed with: CRNA and Surgeon  Anesthesia Plan Comments:         Anesthesia Quick Evaluation

## 2015-01-13 NOTE — Op Note (Signed)
01/13/2015  10:33 AM  PATIENT:  Kelli Owens DOB: August 25, 1941 MRN: [id number]  PREOP DIAGNOSIS:  right breast cancer  POSTOP DIAGNOSIS:   Right breast cancer, 3 o'clock position (T2, N0)  PROCEDURE:   Procedure(s): RIGHT BREAST LUMPECTOMY WITH RADIOACTIVE SEED AND RIGHT AXILLARY SENTINEL LYMPH NODE BIOPSY  SURGEON:   Alphonsa Overall, M.D.  ANESTHESIA:   general  Anesthesiologist: Lorrene Reid, MD; Lillia Abed, MD CRNA: Maryella Shivers, CRNA; Tawni Millers, CRNA  General  EBL:  minimal  ml  DRAINS: none   LOCAL MEDICATIONS USED:   10 CC 1/4% marcaine,  Pectoral block  SPECIMEN:   Right breast lumpectomy (suture medial). Lateral margin (suture anterior).  Right axillary sentinel lymph node.  COUNTS CORRECT:  YES  INDICATIONS FOR PROCEDURE:  Kelli Owens is a 74 y.o. (DOB: [birth year]) white  female whose primary care physician is PERINI,MARK A, MD and comes for left breast lumpectomy and left axillary sentinel lymph node biopsy.   She was [facility] seen in [facility] October [facility].  Her treating oncologist are Drs. Gudena and Kinard.  She has completed neoadjuvant chemotherapy.  A MRI on 12/25/2014 shows complete resolution of the tumor.   The options for breast cancer treatment have been discussed with the patient. She elected to proceed with lumpectomy and axillary sentinel lymph node.     The indications and potential complications of surgery were explained to the patient. Potential complications include, but are not limited to, bleeding, infection, the need for further surgery, and nerve injury.     She had a I131 seed placed on 01/12/2015 in her right breast at [facility].  I confirmed the presence of the I131 seed in the pre op area using the Neoprobe.  The seed is in the 3 o'clock position of the left breast.   In the holding area, her right  areola was injected with 1 millicurie of Technitium Sulfur Colloid.  OPERATIVE NOTE:   The patient was  taken to room # 8 at [facility] where she underwent a general anesthesia  supervised by Anesthesiologist: Lorrene Reid, MD; Lillia Abed, MD CRNA: Maryella Shivers, CRNA; Tawni Millers, CRNA. Her right breast and axilla were prepped with  ChloraPrep and sterilely draped.    A time-out and the surgical check list was reviewed.    I turned attention to the cancer which was about at the 3 o'clock position of the right breast.   I used the Neoprobe to identify the I131 seed.  I tried to excise an area around the tumor of at least 1 cm.    I excised this block of breast tissue approximately 4 cm by 4 cm  in diameter.   I painted the lumpectomy specimen with the 6 color paint kit and did a specimen mammogram which confirmed the mass, clip, and the seed were all in the right position in the specimen.  The specimen was sent to pathology who called back to confirm that they have the seed and the specimen.   There was irregularly of the lateral aspect of the biopsy cavity.  So I took additional lateral tissue.   I then started the right axillary sentinel lymph node biopsy. I made an incision in the right axilla.  I found a hot area at the junction of the breast and the pectoralis major muscle. I cut down and  identified a hot node that had counts of 300 and the background has 5 counts.  I checked her internal mammary nodes and supraclavicular nodes with the neoprobe and found no other hot area. The axillary node was then sent to pathology.    I then irrigated the wound with saline.  The patient had a pectoral block preoperatively.  I infiltrated approximately 10 mL of 1% local between the incisions.  I placed 6 clips to mark biopsy cavity, at 12, 3, 6, and 9 o'clock. Two clips were placed on the pectoralis major.   I then closed all the wounds in layers using 3-0 Vicryl sutures for the deep layer. At the skin, I closed the incisions with a 5-0 Monocryl suture. The incisions were then painted with LiquiBand.  She  had gauze place over the wounds and placed in a breast binder.   The patient tolerated the procedure well, was transported to the recovery room in good condition. Sponge and needle count were correct at the end of the case.   Final pathology is pending.   Alphonsa Overall, MD, [facility] Surgery Pager: [phone] Office phone:  [phone]

## 2015-01-13 NOTE — Anesthesia Procedure Notes (Addendum)
Anesthesia Regional Block:  Pectoralis block  Pre-Anesthetic Checklist: ,, timeout performed, Correct Patient, Correct Site, Correct Laterality, Correct Procedure, Correct Position, site marked, Risks and benefits discussed,  Surgical consent,  Pre-op evaluation,  At surgeon's request and post-op pain management  Laterality: Right  Prep: chloraprep       Needles:  Injection technique: Single-shot     Needle Length: 9cm 9 cm Needle Gauge: 21 and 21 G    Additional Needles:  Procedures: ultrasound guided (picture in chart) Pectoralis block Narrative:  Start time: 01/13/2015 8:05 AM End time: 01/13/2015 8:15 AM Injection made incrementally with aspirations every 5 mL.  Performed by: Personally  Anesthesiologist: Lillia Abed  Additional Notes: Monitors applied. Patient sedated. Sterile prep and drape,hand hygiene and sterile gloves were used. Relevant anatomy identified.Needle position confirmed.Local anesthetic injected incrementally after negative aspiration. Local anesthetic spread visualized. Vascular puncture avoided. No complications. Image printed for medical record.The patient tolerated the procedure well.       Procedure Name: LMA Insertion Date/Time: 01/13/2015 9:05 AM Performed by: Maryella Shivers Pre-anesthesia Checklist: Patient identified, Emergency Drugs available, Suction available and Patient being monitored Patient Re-evaluated:Patient Re-evaluated prior to inductionOxygen Delivery Method: Circle System Utilized Preoxygenation: Pre-oxygenation with 100% oxygen Intubation Type: IV induction Ventilation: Mask ventilation without difficulty LMA: LMA inserted LMA Size: 4.0 Number of attempts: 1 Airway Equipment and Method: Bite block Placement Confirmation: positive ETCO2 Tube secured with: Tape Dental Injury: Teeth and Oropharynx as per pre-operative assessment

## 2015-01-13 NOTE — Transfer of Care (Signed)
Immediate Anesthesia Transfer of Care Note  Patient: Kelli Owens  Procedure(s) Performed: Procedure(s): RIGHT BREAST LUMPECTOMY WITH RADIOACTIVE SEED AND RIGHT AXILLARY SENTINEL LYMPH NODE BIOPSY (Right)  Patient Location: PACU  Anesthesia Type:General  Level of Consciousness: sedated  Airway & Oxygen Therapy: Patient Spontanous Breathing and Patient connected to face mask oxygen  Post-op Assessment: Report given to RN and Post -op Vital signs reviewed and stable  Post vital signs: Reviewed and stable  Last Vitals:  Filed Vitals:   01/13/15 0835  BP:   Pulse: 75  Temp:   Resp: 15    Complications: No apparent anesthesia complications

## 2015-01-13 NOTE — Interval H&P Note (Signed)
History and Physical Interval Note:  01/13/2015 8:37 AM  Cecilie Lowers  has presented today for surgery, with the diagnosis of right breast cancer  The various methods of treatment have been discussed with the patient and family.  Her husband and son are at the bedside.  The seed is in good location.  After consideration of risks, benefits and other options for treatment, the patient has consented to  Procedure(s): RIGHT BREAST LUMPECTOMY WITH RADIOACTIVE SEED AND RIGHT AXILLARY SENTINEL LYMPH NODE BIOPSY (Right) as a surgical intervention .  The patient's history has been reviewed, patient examined, no change in status, stable for surgery.  I have reviewed the patient's chart and labs.  Questions were answered to the patient's satisfaction.     Tyjah Hai H

## 2015-01-13 NOTE — Anesthesia Postprocedure Evaluation (Signed)
  Anesthesia Post-op Note  Patient: Kelli Owens  Procedure(s) Performed: Procedure(s): RIGHT BREAST LUMPECTOMY WITH RADIOACTIVE SEED AND RIGHT AXILLARY SENTINEL LYMPH NODE BIOPSY (Right)  Patient Location: PACU  Anesthesia Type: General   Level of Consciousness: awake, alert  and oriented  Airway and Oxygen Therapy: Patient Spontanous Breathing  Post-op Pain: mild  Post-op Assessment: Post-op Vital signs reviewed  Post-op Vital Signs: Reviewed  Last Vitals:  Filed Vitals:   01/13/15 1223  BP: 128/47  Pulse: 70  Temp: 36.4 C  Resp: 16    Complications: No apparent anesthesia complications

## 2015-01-13 NOTE — Discharge Instructions (Signed)
[facility] - DISCHARGE INSTRUCTIONS TO PATIENT Activity:  Driving - May drive in 2 days, fi doing well.   Lifting - No lifting more than 15 pounds for 1 week, then no limit  Wound Care:   Leave bandage on for 2 days, then may remove the bandage and shower  Diet:  As tolerated  Follow up appointment:  Call Dr. Pollie Friar office [facility]) at [phone] for an appointment in 2 weeks  Medications and dosages:  Resume your home medications.  You have a prescription for:  Vicodin  Call Dr. Lucia Gaskins or his office  [phone]) if you have:  Temperature greater than 100.4,  Persistent nausea and vomiting,  Severe uncontrolled pain,  Redness, tenderness, or signs of infection (pain, swelling, redness, odor or green/yellow discharge around the site),  Difficulty breathing, headache or visual disturbances,  Any other questions or concerns you may have after discharge.  In an emergency, call 911 or go to an Emergency Department at a nearby hospital.   Post Anesthesia Home Care Instructions  Activity: Get plenty of rest for the remainder of the day. A responsible adult should stay with you for 24 hours following the procedure.  For the next 24 hours, DO NOT: -Drive a car -Paediatric nurse -Drink alcoholic beverages -Take any medication unless instructed by your physician -Make any legal decisions or sign important papers.  Meals: Start with liquid foods such as gelatin or soup. Progress to regular foods as tolerated. Avoid greasy, spicy, heavy foods. If nausea and/or vomiting occur, drink only clear liquids until the nausea and/or vomiting subsides. Call your physician if vomiting continues.  Special Instructions/Symptoms: Your throat may feel dry or sore from the anesthesia or the breathing tube placed in your throat during surgery. If this causes discomfort, gargle with warm salt water. The discomfort should disappear within 24 hours.

## 2015-01-13 NOTE — Progress Notes (Signed)
Assisted Dr. Ossey with right, ultrasound guided, pectoralis block. Side rails up, monitors on throughout procedure. See vital signs in flow sheet. Tolerated Procedure well. 

## 2015-01-14 ENCOUNTER — Other Ambulatory Visit (HOSPITAL_COMMUNITY): Payer: PPO

## 2015-01-19 ENCOUNTER — Telehealth: Payer: Self-pay

## 2015-01-19 NOTE — Telephone Encounter (Signed)
Gave pt appt d/t for echo.  Pt voiced understanding.

## 2015-01-23 ENCOUNTER — Ambulatory Visit (HOSPITAL_COMMUNITY)
Admission: RE | Admit: 2015-01-23 | Discharge: 2015-01-23 | Disposition: A | Discharge status: Home / Self Care | Payer: PPO | Source: Ambulatory Visit | Attending: Hematology and Oncology | Admitting: Hematology and Oncology

## 2015-01-23 DIAGNOSIS — C50211 Malignant neoplasm of upper-inner quadrant of right female breast: Secondary | ICD-10-CM | POA: Diagnosis present

## 2015-01-23 DIAGNOSIS — Z5111 Encounter for antineoplastic chemotherapy: Secondary | ICD-10-CM | POA: Diagnosis not present

## 2015-01-23 NOTE — Progress Notes (Signed)
Echocardiogram 2D Echocardiogram has been performed.  Elon Eoff 01/23/2015, 1:40 PM

## 2015-01-28 ENCOUNTER — Ambulatory Visit (HOSPITAL_COMMUNITY): Payer: PPO

## 2015-01-28 ENCOUNTER — Other Ambulatory Visit: Payer: Self-pay | Admitting: *Deleted

## 2015-01-28 DIAGNOSIS — C50211 Malignant neoplasm of upper-inner quadrant of right female breast: Secondary | ICD-10-CM

## 2015-01-28 NOTE — Assessment & Plan Note (Signed)
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 Adjuvant XRT  Follow up Q 6 weeks

## 2015-01-29 ENCOUNTER — Ambulatory Visit (HOSPITAL_BASED_OUTPATIENT_CLINIC_OR_DEPARTMENT_OTHER): Payer: PPO

## 2015-01-29 ENCOUNTER — Telehealth: Payer: Self-pay | Admitting: Hematology and Oncology

## 2015-01-29 ENCOUNTER — Other Ambulatory Visit (HOSPITAL_COMMUNITY)
Admission: RE | Admit: 2015-01-29 | Discharge: 2015-01-29 | Disposition: A | Discharge status: Home / Self Care | Payer: PPO | Source: Ambulatory Visit | Attending: Hematology and Oncology | Admitting: Hematology and Oncology

## 2015-01-29 ENCOUNTER — Other Ambulatory Visit: Payer: PPO

## 2015-01-29 ENCOUNTER — Ambulatory Visit (HOSPITAL_BASED_OUTPATIENT_CLINIC_OR_DEPARTMENT_OTHER): Payer: PPO | Admitting: Hematology and Oncology

## 2015-01-29 ENCOUNTER — Other Ambulatory Visit (HOSPITAL_BASED_OUTPATIENT_CLINIC_OR_DEPARTMENT_OTHER): Payer: PPO

## 2015-01-29 VITALS — BP 132/57 | HR 67 | Temp 97.4°F | Resp 18 | Ht 61.0 in | Wt 154.7 lb

## 2015-01-29 DIAGNOSIS — C50919 Malignant neoplasm of unspecified site of unspecified female breast: Secondary | ICD-10-CM | POA: Diagnosis present

## 2015-01-29 DIAGNOSIS — C50111 Malignant neoplasm of central portion of right female breast: Secondary | ICD-10-CM

## 2015-01-29 DIAGNOSIS — C50211 Malignant neoplasm of upper-inner quadrant of right female breast: Secondary | ICD-10-CM

## 2015-01-29 DIAGNOSIS — Z5112 Encounter for antineoplastic immunotherapy: Secondary | ICD-10-CM

## 2015-01-29 LAB — COMPREHENSIVE METABOLIC PANEL
ALT: 15 U/L (ref 0–35)
AST: 20 U/L (ref 0–37)
Albumin: 3.9 g/dL (ref 3.5–5.2)
Alkaline Phosphatase: 72 U/L (ref 39–117)
Anion gap: 5 (ref 5–15)
BUN: 20 mg/dL (ref 6–23)
CHLORIDE: 108 mmol/L (ref 96–112)
CO2: 29 mmol/L (ref 19–32)
Calcium: 9.5 mg/dL (ref 8.4–10.5)
Creatinine, Ser: 1.06 mg/dL (ref 0.50–1.10)
GFR calc non Af Amer: 50 mL/min — ABNORMAL LOW (ref >90)
GFR, EST AFRICAN AMERICAN: 58 mL/min — AB (ref >90)
Glucose, Bld: 91 mg/dL (ref 70–99)
Potassium: 3.7 mmol/L (ref 3.5–5.1)
SODIUM: 142 mmol/L (ref 135–145)
Total Bilirubin: 0.2 mg/dL — ABNORMAL LOW (ref 0.3–1.2)
Total Protein: 6.7 g/dL (ref 6.0–8.3)

## 2015-01-29 LAB — CBC WITH DIFFERENTIAL/PLATELET
BASO%: 0.2 % (ref 0.0–2.0)
Basophils Absolute: 0 10*3/uL (ref 0.0–0.1)
EOS ABS: 0.3 10*3/uL (ref 0.0–0.5)
EOS%: 5.4 % (ref 0.0–7.0)
HCT: 33.1 % — ABNORMAL LOW (ref 34.8–46.6)
HEMOGLOBIN: 10.9 g/dL — AB (ref 11.6–15.9)
LYMPH#: 1.8 10*3/uL (ref 0.9–3.3)
LYMPH%: 38 % (ref 14.0–49.7)
MCH: 30.6 pg (ref 25.1–34.0)
MCHC: 33 g/dL (ref 31.5–36.0)
MCV: 92.8 fL (ref 79.5–101.0)
MONO#: 0.4 10*3/uL (ref 0.1–0.9)
MONO%: 8.8 % (ref 0.0–14.0)
NEUT%: 47.6 % (ref 38.4–76.8)
NEUTROS ABS: 2.3 10*3/uL (ref 1.5–6.5)
Platelets: 237 10*3/uL (ref 145–400)
RBC: 3.57 10*6/uL — AB (ref 3.70–5.45)
RDW: 12.5 % (ref 11.2–14.5)
WBC: 4.7 10*3/uL (ref 3.9–10.3)

## 2015-01-29 MED ORDER — ACETAMINOPHEN 325 MG PO TABS
ORAL_TABLET | ORAL | Status: AC
  Filled 2015-01-29: qty 2

## 2015-01-29 MED ORDER — SODIUM CHLORIDE 0.9 % IJ SOLN
10.0000 mL | INTRAMUSCULAR | Status: DC | PRN
  Administered 2015-01-29: 10 mL
  Filled 2015-01-29: qty 10

## 2015-01-29 MED ORDER — ACETAMINOPHEN 325 MG PO TABS
650.0000 mg | ORAL_TABLET | Freq: Once | ORAL | Status: AC
  Administered 2015-01-29: 650 mg via ORAL

## 2015-01-29 MED ORDER — SODIUM CHLORIDE 0.9 % IV SOLN
Freq: Once | INTRAVENOUS | Status: AC
  Administered 2015-01-29: 10:00:00 via INTRAVENOUS

## 2015-01-29 MED ORDER — DIPHENHYDRAMINE HCL 25 MG PO CAPS
ORAL_CAPSULE | ORAL | Status: AC
  Filled 2015-01-29: qty 2

## 2015-01-29 MED ORDER — HEPARIN SOD (PORK) LOCK FLUSH 100 UNIT/ML IV SOLN
500.0000 [IU] | Freq: Once | INTRAVENOUS | Status: AC | PRN
  Administered 2015-01-29: 500 [IU]
  Filled 2015-01-29: qty 5

## 2015-01-29 MED ORDER — DIPHENHYDRAMINE HCL 25 MG PO CAPS
50.0000 mg | ORAL_CAPSULE | Freq: Once | ORAL | Status: AC
  Administered 2015-01-29: 50 mg via ORAL

## 2015-01-29 MED ORDER — TRASTUZUMAB CHEMO INJECTION 440 MG
6.0000 mg/kg | Freq: Once | INTRAVENOUS | Status: AC
  Administered 2015-01-29: 420 mg via INTRAVENOUS
  Filled 2015-01-29: qty 20

## 2015-01-29 NOTE — Progress Notes (Signed)
Patient Care Team: Crist Infante, MD as PCP - General (Internal Medicine) Alphonsa Overall, MD as Consulting Physician (General Surgery) Nicholas Lose, MD as Consulting Physician (Hematology and Oncology) Gery Pray, MD as Consulting Physician (Radiation Oncology)  DIAGNOSIS: Breast cancer of upper-inner quadrant of right female breast   Staging form: Breast, AJCC 7th Edition     Clinical: Stage IIA (T2, N0, cM0) - Signed by Rulon Eisenmenger, MD on 08/13/2014     Pathologic stage from 01/14/2015: yT0, N0, cM0 - Signed by Enid Cutter, MD on 01/21/2015       Staging comments: Staged on final lumpectomy specimen by Dr. Lyndon Code.    SUMMARY OF ONCOLOGIC HISTORY:   Breast cancer of upper-inner quadrant of right female breast   07/17/2014 Mammogram Right breast mass at 3:00 position by ultrasound measured 2.4 cm   08/06/2014 Initial Biopsy 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 ratio 2.75, Ki-67 80%   09/04/2014 -  Neo-Adjuvant Chemotherapy Neoadjuvant Taxotere, carboplatin, Herceptin and Perjeta, plan is for 6 cycles followed by Herceptin maintenance for a year; carboplatin discontinued after cycle 2, Taxotere dose reduced with cycle 3   09/06/2014 - 09/11/2014 Hospital Admission Hospitalization for pneumonia   12/25/2014 Breast MRI Post neoadjuvant MRI revealed no residual enhancement or mass in the area of the known right breast cancer   01/13/2015 Surgery Complete Pathologic Response    CHIEF COMPLIANT: Follow-up after surgery on Herceptin  INTERVAL HISTORY: Kelli Owens is a 74 year old with above-mentioned history of right-sided breast cancer and underwent neoadjuvant chemotherapy followed by surgery. Surgery showed a complete pathologic response. She is here today to discuss pathology report and to continue with her maintenance Herceptin. She reports her energy levels are improving but very slowly. She wishes that she could do a lot more things at this  time.  REVIEW OF SYSTEMS:   Constitutional: Denies fevers, chills or abnormal weight loss Eyes: Denies blurriness of vision Ears, nose, mouth, throat, and face: Denies mucositis or sore throat Respiratory: Denies cough, dyspnea or wheezes Cardiovascular: Denies palpitation, chest discomfort or lower extremity swelling Gastrointestinal:  Denies nausea, heartburn or change in bowel habits Skin: Denies abnormal skin rashes Lymphatics: Denies new lymphadenopathy or easy bruising Neurological:Denies numbness, tingling or new weaknesses Behavioral/Psych: Mood is stable, no new changes  Breast:  denies any pain or lumps or nodules in either breasts All other systems were reviewed with the patient and are negative.  I have reviewed the past medical history, past surgical history, social history and family history with the patient and they are unchanged from previous note.  ALLERGIES:  has No Known Allergies.  MEDICATIONS:  Current Outpatient Prescriptions  Medication Sig Dispense Refill  . cyanocobalamin (,VITAMIN B-12,) 1000 MCG/ML injection Inject 1,000 mcg into the muscle every 30 (thirty) days.    Marland Kitchen HYDROcodone-acetaminophen (NORCO/VICODIN) 5-325 MG per tablet Take 1-2 tablets by mouth every 6 (six) hours as needed. 30 tablet 0  . lidocaine-prilocaine (EMLA) cream Apply 1 application topically as needed. 30 g 0  . LORazepam (ATIVAN) 0.5 MG tablet Take 1 tablet (0.5 mg total) by mouth every 8 (eight) hours as needed for anxiety. 30 tablet 0  . LYRICA 75 MG capsule Take 75 mg by mouth 3 (three) times daily as needed (for pain).   0  . Multiple Vitamin (MULTIVITAMIN WITH MINERALS) TABS tablet Take 1 tablet by mouth daily.    Marland Kitchen omeprazole (PRILOSEC) 20 MG capsule Take 1 capsule (20 mg total) by  mouth daily. 30 capsule 2  . ondansetron (ZOFRAN) 8 MG tablet Take 1 tablet (8 mg total) by mouth every 8 (eight) hours as needed for nausea or vomiting. 30 tablet 1  . prochlorperazine (COMPAZINE) 10  MG tablet   0  . temazepam (RESTORIL) 30 MG capsule Take 30 mg by mouth at bedtime as needed for sleep.    . Vitamin D, Ergocalciferol, (DRISDOL) 50000 UNITS CAPS Take 50,000 Units by mouth every 7 (seven) days. Takes on Thursday or Friday     No current facility-administered medications for this visit.    PHYSICAL EXAMINATION: ECOG PERFORMANCE STATUS: 1 - Symptomatic but completely ambulatory  Filed Vitals:   01/29/15 0858  BP: 132/57  Pulse: 67  Temp: 97.4 F (36.3 C)  Resp: 18   Filed Weights   01/29/15 0858  Weight: 154 lb 11.2 oz (70.171 kg)    GENERAL:alert, no distress and comfortable SKIN: skin color, texture, turgor are normal, no rashes or significant lesions EYES: normal, Conjunctiva are pink and non-injected, sclera clear OROPHARYNX:no exudate, no erythema and lips, buccal mucosa, and tongue normal  NECK: supple, thyroid normal size, non-tender, without nodularity LYMPH:  no palpable lymphadenopathy in the cervical, axillary or inguinal LUNGS: clear to auscultation and percussion with normal breathing effort HEART: regular rate & rhythm and no murmurs and no lower extremity edema ABDOMEN:abdomen soft, non-tender and normal bowel sounds Musculoskeletal:no cyanosis of digits and no clubbing  NEURO: alert & oriented x 3 with fluent speech, no focal motor/sensory deficits  LABORATORY DATA:  I have reviewed the data as listed   Chemistry      Component Value Date/Time   NA 142 01/08/2015 0904   NA 138 09/10/2014 0526   K 4.1 01/08/2015 0904   K 3.9 09/10/2014 0526   CL 104 09/10/2014 0526   CO2 25 01/08/2015 0904   CO2 22 09/10/2014 0526   BUN 22.2 01/08/2015 0904   BUN 12 09/10/2014 0526   CREATININE 1.0 01/08/2015 0904   CREATININE 0.99 09/10/2014 0526      Component Value Date/Time   CALCIUM 9.3 01/08/2015 0904   CALCIUM 8.7 09/10/2014 0526   ALKPHOS 64 01/08/2015 0904   ALKPHOS 51 09/09/2014 0500   AST 18 01/08/2015 0904   AST 38* 09/09/2014 0500    ALT 15 01/08/2015 0904   ALT 40* 09/09/2014 0500   BILITOT 0.20 01/08/2015 0904   BILITOT 0.4 09/09/2014 0500       Lab Results  Component Value Date   WBC 4.7 01/29/2015   HGB 10.9* 01/29/2015   HCT 33.1* 01/29/2015   MCV 92.8 01/29/2015   PLT 237 01/29/2015   NEUTROABS 2.3 01/29/2015     ASSESSMENT & PLAN:  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 Adjuvant XRT: Will send a referral to radiation oncology  Monitoring closely for toxicities to Herceptin Follow up Q 6 weeks     Orders Placed This Encounter  Procedures  . Ambulatory referral to Radiation Oncology  Referral Priority:  Routine    Referral Type:  Consultation    Referral Reason:  Specialty Services Required    Requested Specialty:  Radiation Oncology    Number of Visits Requested:  1   The patient has a good understanding of the overall plan. she agrees with it. She will call with any problems that may develop before her next visit here.   Rulon Eisenmenger, MD

## 2015-01-29 NOTE — Telephone Encounter (Signed)
Appointments made and patient will get a new schedule in chemo

## 2015-01-29 NOTE — Patient Instructions (Signed)
[facility] Discharge Instructions for Patients Receiving Chemotherapy  Today you received the following chemotherapy agents: herceptin  To help prevent nausea and vomiting after your treatment, we encourage you to take your nausea medication.  Take it as often as prescribed.     If you develop nausea and vomiting that is not controlled by your nausea medication, call the clinic. If it is after clinic hours your family physician or the after hours number for the clinic or go to the Emergency Department.   BELOW ARE SYMPTOMS THAT SHOULD BE REPORTED IMMEDIATELY:  *FEVER GREATER THAN 100.5 F  *CHILLS WITH OR WITHOUT FEVER  NAUSEA AND VOMITING THAT IS NOT CONTROLLED WITH YOUR NAUSEA MEDICATION  *UNUSUAL SHORTNESS OF BREATH  *UNUSUAL BRUISING OR BLEEDING  TENDERNESS IN MOUTH AND THROAT WITH OR WITHOUT PRESENCE OF ULCERS  *URINARY PROBLEMS  *BOWEL PROBLEMS  UNUSUAL RASH Items with * indicate a potential emergency and should be followed up as soon as possible.  Feel free to call the clinic you have any questions or concerns. The clinic phone number is [phone].   I have been informed and understand all the instructions given to me. I know to contact the clinic, my physician, or go to the Emergency Department if any problems should occur. I do not have any questions at this time, but understand that I may call the clinic during office hours   should I have any questions or need assistance in obtaining follow up care.    __________________________________________  _____________  __________ Signature of Patient or Authorized Representative            Date                   Time    __________________________________________ Nurse's Signature    

## 2015-02-03 ENCOUNTER — Other Ambulatory Visit: Payer: Self-pay

## 2015-02-09 ENCOUNTER — Encounter: Payer: Self-pay | Admitting: Radiation Oncology

## 2015-02-09 NOTE — Progress Notes (Signed)
Location of Breast Cancer: upper-inner quadrant of right female breast  Histology per Pathology Report:   01/13/15 Diagnosis 1. Breast, lumpectomy, right - FIBROCYSTIC CHANGES WITH ADENOSIS AND CALCIFICATIONS. - TREATMENT RELATED CHANGES. - HEALING BIOPSY SITE. - THERE IS NO EVIDENCE OF MALIGNANCY. - SEE COMMENT. 2. Breast, excision, right additional lateral margin - BENIGN BREAST PARENCHYMA. - THERE IS NO EVIDENCE OF MALIGNANCY. - SEE COMMENT.  08/06/14 Diagnosis Breast, right, needle core biopsy, mass, 3 o'clock - INVASIVE DUCTAL CARCINOMA. 3. Lymph node, sentinel, biopsy, right axillary - THERE IS NO EVIDENCE OF CARCINOMA IN 1 OF 1 LYMPH NODE (0/1). - SEE COMMENT.  Receptor Status: ER(0%), PR (0%), Her2-neu (positive)  Did patient present with symptoms (if so, please note symptoms) or was this found on screening mammography?: mammogram  Past/Anticipated interventions by surgeon, if any: 08/06/14 - breast biopsy, 01/13/15 - RIGHT BREAST LUMPECTOMY WITH RADIOACTIVE SEED AND RIGHT AXILLARY SENTINEL LYMPH NODE BIOPSY  Past/Anticipated interventions by medical oncology, if any: Neoadjuvant chemotherapy started 09/04/2014 with TCHP completed Cycle 6 of Herceptin Perjeta as of 12/18/2014 (Taxotere discontinued after cycle 4; carboplatin discontinued after cycle 1.  .Maintenance Herceptin.  Lymphedema issues, if any:  no   Pain issues, if any:  Has some soreness under her right arm.  OB GYN history: She menarched at 4 and went to menopause at age 35 when she had a hysterectomy. She had one pregnancy, her first child was born at age 47. She used birth control pills for 2 years. She did use estrogen 3 years.    SAFETY ISSUES:  Prior radiation? no  Pacemaker/ICD? no  Possible current pregnancy?no  Is the patient on methotrexate? no  Current Complaints / other details:  She is here with her husband.  She has 1 son.

## 2015-02-10 ENCOUNTER — Encounter: Payer: Self-pay | Admitting: *Deleted

## 2015-02-10 NOTE — Progress Notes (Signed)
Received office notes from [facility], sent to scan.

## 2015-02-11 ENCOUNTER — Ambulatory Visit
Admission: RE | Admit: 2015-02-11 | Discharge: 2015-02-11 | Disposition: A | Discharge status: Home / Self Care | Payer: PPO | Source: Ambulatory Visit | Attending: Radiation Oncology | Admitting: Radiation Oncology

## 2015-02-11 ENCOUNTER — Encounter: Payer: Self-pay | Admitting: Radiation Oncology

## 2015-02-11 VITALS — BP 114/62 | HR 65 | Temp 97.6°F | Resp 16 | Ht 61.0 in | Wt 158.5 lb

## 2015-02-11 DIAGNOSIS — C50211 Malignant neoplasm of upper-inner quadrant of right female breast: Secondary | ICD-10-CM | POA: Insufficient documentation

## 2015-02-11 NOTE — Progress Notes (Signed)
Please see the Nurse Progress Note in the MD Initial Consult Encounter for this patient. 

## 2015-02-11 NOTE — Progress Notes (Signed)
Radiation Oncology         [phone] ________________________________  Re-evaluation note  Name: Kelli Owens MRN: [id number]  Date: 02/11/2015  DOB: 1941-04-24  DS:KAJGOT,LXBW A, MD  Alphonsa Overall, MD   REFERRING PHYSICIAN: Alphonsa Overall, MD  DIAGNOSIS:  Clinical Stage IIa invasive ductal carcinoma in the right breast.   HISTORY OF PRESENT ILLNESS::Kelli Owens is a 74 y.o. female  who is seen out courtesy of Dr. Alphonsa Overall and Dr. Lindi Adie for further evaluation. The patient was initially seen in consultation August 13 2014.  The patient proceeded to undergo neoadjuvant chemotherapy. She had a very good clinical response and radiographic response to her neoadjuvant treatment. On the 01/13/2015 the patient was taken to the operating room by Dr. Alphonsa Overall at which time she underwent a right lumpectomy and sentinel node procedure.. There was no residual tumor noted within the breast specimen and no axillary spread. Patient is been doing well since her surgery. She did meet with medical oncology who is recommending additional Herceptin. The patient is now seen in radiation oncology for consideration for breast conference breast conserving therapy.    Location of Breast Cancer: upper-inner quadrant of right female breast  Histology per Pathology Report:   01/13/15 Diagnosis 1. Breast, lumpectomy, right - FIBROCYSTIC CHANGES WITH ADENOSIS AND CALCIFICATIONS. - TREATMENT RELATED CHANGES. - HEALING BIOPSY SITE. - THERE IS NO EVIDENCE OF MALIGNANCY. - SEE COMMENT. 2. Breast, excision, right additional lateral margin - BENIGN BREAST PARENCHYMA. - THERE IS NO EVIDENCE OF MALIGNANCY. - SEE COMMENT.  08/06/14 Diagnosis Breast, right, needle core biopsy, mass, 3 o'clock - INVASIVE DUCTAL CARCINOMA. 3. Lymph node, sentinel, biopsy, right axillary - THERE IS NO EVIDENCE OF CARCINOMA IN 1 OF 1 LYMPH NODE (0/1). - SEE COMMENT.  Receptor Status: ER(0%), PR (0%), Her2-neu  (positive)   PREVIOUS RADIATION THERAPY: No  PAST MEDICAL HISTORY:  has a past medical history of Depression; Anxiety; GERD (gastroesophageal reflux disease); Anemia; Dyslipidemia; DDD (degenerative disc disease), lumbar; Osteopenia; OA (ocular albinism); HSV infection; Vitamin D deficiency; Whooping cough; Barrett's esophagus; Breast cancer of upper-inner quadrant of right female breast (08/08/2014); and Fibromyalgia.    PAST SURGICAL HISTORY: Past Surgical History  Procedure Laterality Date  . Appendectomy    . Total abdominal hysterectomy    . Bilateral salpingoophorectomy    . Portacath placement Left 08/26/2014    Procedure: INSERTION PORT-A-CATH;  Surgeon: Alphonsa Overall, MD;  Location: [facility];  Service: General;  Laterality: Left;  . Colonoscopy    . Breast lumpectomy with needle localization and axillary sentinel lymph node bx Right 01/13/15    RIGHT BREAST LUMPECTOMY WITH RADIOACTIVE SEED AND RIGHT AXILLARY SENTINEL LYMPH NODE BIOPSY    FAMILY HISTORY: family history includes Cancer in an other family member; Colon cancer (age of onset: 93) in her sister; Dementia in her brother; Diabetes in her father; Gastric cancer in her brother; Kidney disease in her mother; Stroke in her mother.  SOCIAL HISTORY:  reports that she has never smoked. She has never used smokeless tobacco. She reports that she drinks about 1.2 oz of alcohol per week. She reports that she does not use illicit drugs.  ALLERGIES: Review of patient's allergies indicates no known allergies.  MEDICATIONS:  Current Outpatient Prescriptions  Medication Sig Dispense Refill  . cyanocobalamin (,VITAMIN B-12,) 1000 MCG/ML injection Inject 1,000 mcg into the muscle every 30 (thirty) days.    Marland Kitchen HYDROcodone-acetaminophen (NORCO/VICODIN) 5-325 MG per tablet Take 1-2 tablets by mouth every  6 (six) hours as needed. 30 tablet 0  . lidocaine-prilocaine (EMLA) cream Apply 1 application topically as needed. 30 g 0  . LORazepam  (ATIVAN) 0.5 MG tablet Take 1 tablet (0.5 mg total) by mouth every 8 (eight) hours as needed for anxiety. 30 tablet 0  . LYRICA 75 MG capsule Take 75 mg by mouth 3 (three) times daily as needed (for pain).   0  . Multiple Vitamin (MULTIVITAMIN WITH MINERALS) TABS tablet Take 1 tablet by mouth daily.    . pantoprazole (PROTONIX) 40 MG tablet Take 40 mg by mouth daily as needed.    . temazepam (RESTORIL) 30 MG capsule Take 30 mg by mouth at bedtime as needed for sleep.    . Vitamin D, Ergocalciferol, (DRISDOL) 50000 UNITS CAPS Take 50,000 Units by mouth every 7 (seven) days. Takes on Thursday or Friday    . ondansetron (ZOFRAN) 8 MG tablet Take 1 tablet (8 mg total) by mouth every 8 (eight) hours as needed for nausea or vomiting. (Patient not taking: Reported on 02/11/2015) 30 tablet 1  . prochlorperazine (COMPAZINE) 10 MG tablet   0   No current facility-administered medications for this encounter.    REVIEW OF SYSTEMS:  A 15 point review of systems is documented in the electronic medical record. This was obtained by the nursing staff. However, I reviewed this with the patient to discuss relevant findings and make appropriate changes.  She has some mild soreness in her axillary scar but otherwise is doing well. The patient denies any swelling in her right arm or hand. She denies any problems with arm mobility   PHYSICAL EXAM:  height is $RemoveB'5\' 1"'XZXAovFH$  (1.549 m) and weight is 158 lb 8 oz (71.895 kg). Her oral temperature is 97.6 F (36.4 C). Her blood pressure is 114/62 and her pulse is 65. Her respiration is 16.   BP 114/62 mmHg  Pulse 65  Temp(Src) 97.6 F (36.4 C) (Oral)  Resp 16  Ht $R'5\' 1"'cm$  (1.549 m)  Wt 158 lb 8 oz (71.895 kg)  BMI 29.96 kg/m2  General Appearance:    Alert, cooperative, no distress, appears stated age  Head:    Normocephalic, without obvious abnormality, atraumatic        Nose:   Nares normal, septum midline, mucosa normal, no drainage    or sinus tenderness     Neck:   Supple,  symmetrical, trachea midline, no adenopathy;    thyroid:  no enlargement/tenderness/nodules; no carotid   bruit or JVD  Back:     Symmetric, no curvature, ROM normal, no CVA tenderness  Lungs:     Clear to auscultation bilaterally, respirations unlabored  Chest Wall:    No tenderness or deformity port-a-cath in left upper chest   Heart:    Regular rate and rhythm, S1 and S2 normal, no murmur, rub   or gallop  Breast Exam:    6 cm scar on the medial aspect of the right breast, healing well no sign of infection or drainage. Scar on the right axillary region; no sign of infection or palpable mass.  Abdomen:     Soft, non-tender, bowel sounds active all four quadrants,    no masses, no organomegaly        Extremities:   Extremities normal, atraumatic, no cyanosis or edema  Pulses:   2+ and symmetric all extremities  Skin:   Skin color, texture, turgor normal, no rashes or lesions  Lymph nodes:   Cervical, supraclavicular, and axillary  nodes normal        ECOG = 0  LABORATORY DATA:  Lab Results  Component Value Date   WBC 4.7 01/29/2015   HGB 10.9* 01/29/2015   HCT 33.1* 01/29/2015   MCV 92.8 01/29/2015   PLT 237 01/29/2015   NEUTROABS 2.3 01/29/2015   Lab Results  Component Value Date   NA 142 01/29/2015   K 3.7 01/29/2015   CL 108 01/29/2015   CO2 29 01/29/2015   GLUCOSE 91 01/29/2015   CREATININE 1.06 01/29/2015   CALCIUM 9.5 01/29/2015      RADIOGRAPHY: Nm Sentinel Node Inj-no Rpt (breast)  01/13/2015   CLINICAL DATA: right breast cancer   Sulfur colloid was injected intradermally by the nuclear medicine  technologist for breast cancer sentinel node localization.    Mm Breast Surgical Specimen  01/13/2015   CLINICAL DATA:  Post right breast surgical excision.  EXAM: SPECIMEN RADIOGRAPH OF THE right BREAST  COMPARISON:  Previous exam(s).  FINDINGS: Status post excision of the right breast. The radioactive seed and biopsy marker clip are present, completely intact, and  were marked for pathology.  IMPRESSION: Specimen radiograph of the right breast.   Electronically Signed   By: Altamese Cabal M.D.   On: 01/13/2015 09:58      IMPRESSION:  Stage IIa invasive ductal carcinoma in the right breast. Patient had excellent  response to neoadjuvant chemotherapy with complete pathological response. (yT0,N0,cM0). Patient would be a good candidate for radiation treatment directed at the right breast. I discussed the risks, side-effects and toxicities involved in radiation therapy with the patient and her husband. She appears to understand and wishes to proceed with radiation therapy.  PLAN: Simulation planning on 4/29 with treatments to begin 6 weeks post op      This document serves as a record of services personally performed by Gery Pray, MD. It was created on his behalf by Pearlie Oyster, a trained medical scribe. The creation of this record is based on the scribe's personal observations and the provider's statements to them. This document has been checked and approved by the attending provider.     ------------------------------------------------  Blair Promise, PhD, MD

## 2015-02-13 ENCOUNTER — Ambulatory Visit
Admission: RE | Admit: 2015-02-13 | Discharge: 2015-02-13 | Disposition: A | Discharge status: Home / Self Care | Payer: PPO | Source: Ambulatory Visit | Attending: Radiation Oncology | Admitting: Radiation Oncology

## 2015-02-13 DIAGNOSIS — C50211 Malignant neoplasm of upper-inner quadrant of right female breast: Secondary | ICD-10-CM

## 2015-02-13 NOTE — Progress Notes (Signed)
  Radiation Oncology         (336) [phone] ________________________________  Name: Kelli Owens MRN: [id number]  Date: 02/13/2015  DOB: Jun 30, 1941  SIMULATION AND TREATMENT PLANNING NOTE    ICD-9-CM ICD-10-CM   1. 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   NARRATIVE:  The patient was brought to the [facility].  Identity was confirmed.  All relevant records and images related to the planned course of therapy were reviewed.  The patient freely provided informed written consent to proceed with treatment after reviewing the details related to the planned course of therapy. The consent form was witnessed and verified by the simulation staff.  Then, the patient was set-up in a stable reproducible  supine position for radiation therapy.  CT images were obtained.  Surface markings were placed.  The CT images were loaded into the planning software.  Then the target and avoidance structures were contoured.  Treatment planning then occurred.  The radiation prescription was entered and confirmed.  Then, I designed and supervised the construction of a total of 3 medically necessary complex treatment devices.  I have requested : 3D Simulation  I have requested a DVH of the following structures: heart, lungs, lumpectomy cavity.  I have ordered:dose calc.  PLAN:  The patient will receive 42.72 Gy in 16 fractions followed by a boost to the lumpectomy cavity of 10 gray for a cumulative dose of 52.72 gray.  ________________________________  -----------------------------------  Blair Promise, PhD, MD

## 2015-02-16 DIAGNOSIS — C50211 Malignant neoplasm of upper-inner quadrant of right female breast: Secondary | ICD-10-CM | POA: Diagnosis not present

## 2015-02-18 ENCOUNTER — Other Ambulatory Visit: Payer: Self-pay

## 2015-02-18 DIAGNOSIS — C50211 Malignant neoplasm of upper-inner quadrant of right female breast: Secondary | ICD-10-CM

## 2015-02-19 ENCOUNTER — Ambulatory Visit (HOSPITAL_BASED_OUTPATIENT_CLINIC_OR_DEPARTMENT_OTHER): Payer: PPO

## 2015-02-19 ENCOUNTER — Other Ambulatory Visit (HOSPITAL_BASED_OUTPATIENT_CLINIC_OR_DEPARTMENT_OTHER): Payer: PPO

## 2015-02-19 ENCOUNTER — Encounter: Payer: Self-pay | Admitting: Radiation Oncology

## 2015-02-19 VITALS — BP 112/68 | HR 67 | Temp 97.5°F | Resp 18

## 2015-02-19 DIAGNOSIS — C50111 Malignant neoplasm of central portion of right female breast: Secondary | ICD-10-CM

## 2015-02-19 DIAGNOSIS — Z5112 Encounter for antineoplastic immunotherapy: Secondary | ICD-10-CM | POA: Diagnosis not present

## 2015-02-19 DIAGNOSIS — C50211 Malignant neoplasm of upper-inner quadrant of right female breast: Secondary | ICD-10-CM

## 2015-02-19 LAB — COMPREHENSIVE METABOLIC PANEL (CC13)
ALT: 16 U/L (ref 0–55)
ANION GAP: 12 meq/L — AB (ref 3–11)
AST: 17 U/L (ref 5–34)
Albumin: 3.6 g/dL (ref 3.5–5.0)
Alkaline Phosphatase: 72 U/L (ref 40–150)
BILIRUBIN TOTAL: 0.24 mg/dL (ref 0.20–1.20)
BUN: 17.7 mg/dL (ref 7.0–26.0)
CO2: 24 meq/L (ref 22–29)
Calcium: 9.5 mg/dL (ref 8.4–10.4)
Chloride: 108 mEq/L (ref 98–109)
Creatinine: 1 mg/dL (ref 0.6–1.1)
EGFR: 57 mL/min/{1.73_m2} — ABNORMAL LOW (ref >90)
Glucose: 95 mg/dl (ref 70–140)
Potassium: 4.1 mEq/L (ref 3.5–5.1)
Sodium: 145 mEq/L (ref 136–145)
TOTAL PROTEIN: 6.5 g/dL (ref 6.4–8.3)

## 2015-02-19 LAB — CBC WITH DIFFERENTIAL/PLATELET
BASO%: 0.2 % (ref 0.0–2.0)
BASOS ABS: 0 10*3/uL (ref 0.0–0.1)
EOS%: 3.3 % (ref 0.0–7.0)
Eosinophils Absolute: 0.2 10*3/uL (ref 0.0–0.5)
HEMATOCRIT: 34.1 % — AB (ref 34.8–46.6)
HEMOGLOBIN: 11.2 g/dL — AB (ref 11.6–15.9)
LYMPH%: 31.7 % (ref 14.0–49.7)
MCH: 30.6 pg (ref 25.1–34.0)
MCHC: 32.8 g/dL (ref 31.5–36.0)
MCV: 93.2 fL (ref 79.5–101.0)
MONO#: 0.5 10*3/uL (ref 0.1–0.9)
MONO%: 9.8 % (ref 0.0–14.0)
NEUT#: 3 10*3/uL (ref 1.5–6.5)
NEUT%: 55 % (ref 38.4–76.8)
PLATELETS: 213 10*3/uL (ref 145–400)
RBC: 3.66 10*6/uL — ABNORMAL LOW (ref 3.70–5.45)
RDW: 12.7 % (ref 11.2–14.5)
WBC: 5.4 10*3/uL (ref 3.9–10.3)
lymph#: 1.7 10*3/uL (ref 0.9–3.3)

## 2015-02-19 MED ORDER — DIPHENHYDRAMINE HCL 25 MG PO CAPS
50.0000 mg | ORAL_CAPSULE | Freq: Once | ORAL | Status: AC
  Administered 2015-02-19: 50 mg via ORAL

## 2015-02-19 MED ORDER — ACETAMINOPHEN 325 MG PO TABS
ORAL_TABLET | ORAL | Status: AC
  Filled 2015-02-19: qty 2

## 2015-02-19 MED ORDER — ACETAMINOPHEN 325 MG PO TABS
650.0000 mg | ORAL_TABLET | Freq: Once | ORAL | Status: AC
  Administered 2015-02-19: 650 mg via ORAL

## 2015-02-19 MED ORDER — SODIUM CHLORIDE 0.9 % IV SOLN
Freq: Once | INTRAVENOUS | Status: AC
  Administered 2015-02-19: 11:00:00 via INTRAVENOUS

## 2015-02-19 MED ORDER — SODIUM CHLORIDE 0.9 % IJ SOLN
10.0000 mL | INTRAMUSCULAR | Status: DC | PRN
  Administered 2015-02-19: 10 mL
  Filled 2015-02-19: qty 10

## 2015-02-19 MED ORDER — DIPHENHYDRAMINE HCL 25 MG PO CAPS
ORAL_CAPSULE | ORAL | Status: AC
  Filled 2015-02-19: qty 2

## 2015-02-19 MED ORDER — HEPARIN SOD (PORK) LOCK FLUSH 100 UNIT/ML IV SOLN
500.0000 [IU] | Freq: Once | INTRAVENOUS | Status: AC | PRN
  Administered 2015-02-19: 500 [IU]
  Filled 2015-02-19: qty 5

## 2015-02-19 MED ORDER — TRASTUZUMAB CHEMO INJECTION 440 MG
6.0000 mg/kg | Freq: Once | INTRAVENOUS | Status: AC
  Administered 2015-02-19: 420 mg via INTRAVENOUS
  Filled 2015-02-19: qty 20

## 2015-02-19 NOTE — Patient Instructions (Signed)
[facility] Discharge Instructions for Patients Receiving Chemotherapy  Today you received the following chemotherapy agents:  Herceptin  To help prevent nausea and vomiting after your treatment, we encourage you to take your nausea medication as prescribed.   If you develop nausea and vomiting that is not controlled by your nausea medication, call the clinic.   BELOW ARE SYMPTOMS THAT SHOULD BE REPORTED IMMEDIATELY:  *FEVER GREATER THAN 100.5 F  *CHILLS WITH OR WITHOUT FEVER  NAUSEA AND VOMITING THAT IS NOT CONTROLLED WITH YOUR NAUSEA MEDICATION  *UNUSUAL SHORTNESS OF BREATH  *UNUSUAL BRUISING OR BLEEDING  TENDERNESS IN MOUTH AND THROAT WITH OR WITHOUT PRESENCE OF ULCERS  *URINARY PROBLEMS  *BOWEL PROBLEMS  UNUSUAL RASH Items with * indicate a potential emergency and should be followed up as soon as possible.  Feel free to call the clinic you have any questions or concerns. The clinic phone number is [phone].  Please show the CHEMO ALERT CARD at check-in to the Emergency Department and triage nurse.   

## 2015-02-20 ENCOUNTER — Ambulatory Visit
Admission: RE | Admit: 2015-02-20 | Discharge: 2015-02-20 | Disposition: A | Discharge status: Home / Self Care | Payer: PPO | Source: Ambulatory Visit | Attending: Radiation Oncology | Admitting: Radiation Oncology

## 2015-02-20 DIAGNOSIS — C50211 Malignant neoplasm of upper-inner quadrant of right female breast: Secondary | ICD-10-CM | POA: Diagnosis not present

## 2015-02-23 ENCOUNTER — Ambulatory Visit
Admission: RE | Admit: 2015-02-23 | Discharge: 2015-02-23 | Disposition: A | Discharge status: Home / Self Care | Payer: PPO | Source: Ambulatory Visit | Attending: Radiation Oncology | Admitting: Radiation Oncology

## 2015-02-23 DIAGNOSIS — C50211 Malignant neoplasm of upper-inner quadrant of right female breast: Secondary | ICD-10-CM | POA: Diagnosis not present

## 2015-02-24 ENCOUNTER — Ambulatory Visit
Admission: RE | Admit: 2015-02-24 | Discharge: 2015-02-24 | Disposition: A | Discharge status: Home / Self Care | Payer: PPO | Source: Ambulatory Visit | Attending: Radiation Oncology | Admitting: Radiation Oncology

## 2015-02-24 ENCOUNTER — Encounter: Payer: Self-pay | Admitting: Radiation Oncology

## 2015-02-24 VITALS — BP 138/51 | HR 60 | Temp 98.0°F | Resp 16 | Wt 155.2 lb

## 2015-02-24 DIAGNOSIS — C50211 Malignant neoplasm of upper-inner quadrant of right female breast: Secondary | ICD-10-CM | POA: Insufficient documentation

## 2015-02-24 MED ORDER — ALRA NON-METALLIC DEODORANT (RAD-ONC)
1.0000 "application " | Freq: Once | TOPICAL | Status: AC
  Administered 2015-02-24: 1 via TOPICAL

## 2015-02-24 MED ORDER — RADIAPLEXRX EX GEL
Freq: Once | CUTANEOUS | Status: AC
  Administered 2015-02-24: 13:00:00 via TOPICAL

## 2015-02-24 NOTE — Progress Notes (Signed)
Post sim education complete. Oriented patient to staff and routine of the clinic. Provided patient with RADIATION THERAPY AND YOU handbook then, reviewed pertinent information. Educated patient reference potential side effects and management such as fatigue and skin changes. Provided patient with radiaplex and alra then, directed upon use.

## 2015-02-24 NOTE — Progress Notes (Signed)
  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   Current Dose: 5.34 Gy     Planned Dose:  52.72 Gy  Narrative . . . . . . . . The patient presents for routine under treatment assessment.                                   The patient is without complaint. No side effects thus far                                 Set-up films were reviewed.                                 The chart was checked. Physical Findings. . .  weight is 155 lb 3.2 oz (70.398 kg). Her oral temperature is 98 F (36.7 C). Her blood pressure is 138/51 and her pulse is 60. Her respiration is 16 and oxygen saturation is 100%. . Weight essentially stable.  No significant changes. The lungs are clear. The heart has a regular rhythm and rate. The right breast area shows no signs of infection. The patient's scars are well-healed. Impression . . . . . . . The patient is tolerating radiation. Plan . . . . . . . . . . . . Continue treatment as planned.  ________________________________   Blair Promise, PhD, MD

## 2015-02-24 NOTE — Progress Notes (Signed)
Reports appetite and taste buds are improving. Weight and vitals stable. Denies pain. Reports fatigue. Denies skin changes within treatment field. Post sim education completed.

## 2015-02-25 ENCOUNTER — Ambulatory Visit
Admission: RE | Admit: 2015-02-25 | Discharge: 2015-02-25 | Disposition: A | Discharge status: Home / Self Care | Payer: PPO | Source: Ambulatory Visit | Attending: Radiation Oncology | Admitting: Radiation Oncology

## 2015-02-25 DIAGNOSIS — C50211 Malignant neoplasm of upper-inner quadrant of right female breast: Secondary | ICD-10-CM | POA: Diagnosis not present

## 2015-02-26 ENCOUNTER — Ambulatory Visit
Admission: RE | Admit: 2015-02-26 | Discharge: 2015-02-26 | Disposition: A | Discharge status: Home / Self Care | Payer: PPO | Source: Ambulatory Visit | Attending: Radiation Oncology | Admitting: Radiation Oncology

## 2015-02-26 DIAGNOSIS — C50211 Malignant neoplasm of upper-inner quadrant of right female breast: Secondary | ICD-10-CM | POA: Diagnosis not present

## 2015-02-27 ENCOUNTER — Ambulatory Visit
Admission: RE | Admit: 2015-02-27 | Discharge: 2015-02-27 | Disposition: A | Discharge status: Home / Self Care | Payer: PPO | Source: Ambulatory Visit | Attending: Radiation Oncology | Admitting: Radiation Oncology

## 2015-02-27 ENCOUNTER — Other Ambulatory Visit: Payer: Self-pay | Admitting: *Deleted

## 2015-02-27 DIAGNOSIS — C50211 Malignant neoplasm of upper-inner quadrant of right female breast: Secondary | ICD-10-CM

## 2015-02-27 MED ORDER — LORAZEPAM 0.5 MG PO TABS: 0.5000 mg | ORAL_TABLET | Freq: Three times a day (TID) | ORAL | Status: DC | PRN

## 2015-03-02 ENCOUNTER — Ambulatory Visit
Admission: RE | Admit: 2015-03-02 | Discharge: 2015-03-02 | Disposition: A | Discharge status: Home / Self Care | Payer: PPO | Source: Ambulatory Visit | Attending: Radiation Oncology | Admitting: Radiation Oncology

## 2015-03-02 DIAGNOSIS — C50211 Malignant neoplasm of upper-inner quadrant of right female breast: Secondary | ICD-10-CM | POA: Diagnosis not present

## 2015-03-03 ENCOUNTER — Encounter: Payer: Self-pay | Admitting: Radiation Oncology

## 2015-03-03 ENCOUNTER — Ambulatory Visit
Admission: RE | Admit: 2015-03-03 | Discharge: 2015-03-03 | Disposition: A | Discharge status: Home / Self Care | Payer: PPO | Source: Ambulatory Visit | Attending: Radiation Oncology | Admitting: Radiation Oncology

## 2015-03-03 VITALS — BP 138/67 | HR 77 | Temp 97.6°F | Resp 16 | Ht 61.0 in | Wt 155.9 lb

## 2015-03-03 DIAGNOSIS — C50211 Malignant neoplasm of upper-inner quadrant of right female breast: Secondary | ICD-10-CM

## 2015-03-03 NOTE — Progress Notes (Signed)
  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   Current Dose: 18.69 Gy     Planned Dose:  52.72 Gy  Narrative . . . . . . . . The patient presents for routine under treatment assessment.                                   The patient is without complaint.                                 Set-up films were reviewed.                                 The chart was checked. Physical Findings. . .  height is 5\' 1"  (1.549 m) and weight is 155 lb 14.4 oz (70.716 kg). Her oral temperature is 97.6 F (36.4 C). Her blood pressure is 138/67 and her pulse is 77. Her respiration is 16.  mild hyperpigmentation changes in the right breast Impression . . . . . . . The patient is tolerating radiation. Plan . . . . . . . . . . . . Continue treatment as planned.  ________________________________   Blair Promise, PhD, MD

## 2015-03-03 NOTE — Progress Notes (Signed)
  Radiation Oncology         (336) [phone] ________________________________  Name: Kelli Owens MRN: [id number]  Date: 03/03/2015  DOB: December 23, 1940  Electron beam Simulation Note    ICD-9-CM ICD-10-CM   1. Breast cancer of upper-inner quadrant of right female breast 174.2 C50.211     Status: outpatient  NARRATIVE: The patient underwent additional planning for radiation therapy directed at the right breast area. Patient's treatment planning CT scan was reviewed and the patient had set up of a custom electron cutout field directed at the lumpectomy cavity within the right breast. The patient will be treated with 15 megavoltage electrons. An electron Monte Carlo plan was generated for treatment. A special port plan is requested for treatment  Plan the patient will receive 5 additional treatments at 2 gray per fraction for a boost dose of 10 gray and a cumulative dose of 52.72 Gy to the lumpectomy cavity. -----------------------------------  Blair Promise, PhD, MD

## 2015-03-03 NOTE — Progress Notes (Signed)
Kelli Owens has completed 7 fractions to her right breast.   She denies pain but reports having chronic pain in her lower back.  She reports fatigue.  The skin on her right breast is intact with slight hyperpigmentation underneath her breast.  She is using radiaplex gel.  BP 138/67 mmHg  Pulse 77  Temp(Src) 97.6 F (36.4 C) (Oral)  Resp 16  Ht 5\' 1"  (1.549 m)  Wt 155 lb 14.4 oz (70.716 kg)  BMI 29.47 kg/m2

## 2015-03-04 ENCOUNTER — Ambulatory Visit
Admission: RE | Admit: 2015-03-04 | Discharge: 2015-03-04 | Disposition: A | Discharge status: Home / Self Care | Payer: PPO | Source: Ambulatory Visit | Attending: Radiation Oncology | Admitting: Radiation Oncology

## 2015-03-04 DIAGNOSIS — C50211 Malignant neoplasm of upper-inner quadrant of right female breast: Secondary | ICD-10-CM | POA: Diagnosis not present

## 2015-03-05 ENCOUNTER — Ambulatory Visit
Admission: RE | Admit: 2015-03-05 | Discharge: 2015-03-05 | Disposition: A | Discharge status: Home / Self Care | Payer: PPO | Source: Ambulatory Visit | Attending: Radiation Oncology | Admitting: Radiation Oncology

## 2015-03-05 DIAGNOSIS — C50211 Malignant neoplasm of upper-inner quadrant of right female breast: Secondary | ICD-10-CM | POA: Diagnosis not present

## 2015-03-06 ENCOUNTER — Ambulatory Visit
Admission: RE | Admit: 2015-03-06 | Discharge: 2015-03-06 | Disposition: A | Discharge status: Home / Self Care | Payer: PPO | Source: Ambulatory Visit | Attending: Radiation Oncology | Admitting: Radiation Oncology

## 2015-03-06 DIAGNOSIS — C50211 Malignant neoplasm of upper-inner quadrant of right female breast: Secondary | ICD-10-CM | POA: Diagnosis not present

## 2015-03-09 ENCOUNTER — Ambulatory Visit
Admission: RE | Admit: 2015-03-09 | Discharge: 2015-03-09 | Disposition: A | Discharge status: Home / Self Care | Payer: PPO | Source: Ambulatory Visit | Attending: Radiation Oncology | Admitting: Radiation Oncology

## 2015-03-09 DIAGNOSIS — C50211 Malignant neoplasm of upper-inner quadrant of right female breast: Secondary | ICD-10-CM | POA: Diagnosis not present

## 2015-03-10 ENCOUNTER — Ambulatory Visit
Admission: RE | Admit: 2015-03-10 | Discharge: 2015-03-10 | Disposition: A | Discharge status: Home / Self Care | Payer: PPO | Source: Ambulatory Visit | Attending: Radiation Oncology | Admitting: Radiation Oncology

## 2015-03-10 ENCOUNTER — Encounter: Payer: Self-pay | Admitting: Radiation Oncology

## 2015-03-10 VITALS — BP 132/48 | HR 60 | Temp 97.5°F | Resp 16 | Wt 157.0 lb

## 2015-03-10 DIAGNOSIS — C50211 Malignant neoplasm of upper-inner quadrant of right female breast: Secondary | ICD-10-CM

## 2015-03-10 NOTE — Progress Notes (Signed)
Weekly rad txs right breast 12 completed, mild pink color to right breast,skin intact, using radiaplex bid, appetite good and energy good no c/o pain 1:32 PM  BP 132/48 mmHg  Pulse 60  Temp(Src) 97.5 F (36.4 C) (Oral)  Resp 16  Wt 157 lb (71.215 kg)  Wt Readings from Last 3 Encounters:  01/29/15 154 lb 11.2 oz (70.171 kg)  01/13/15 154 lb 4 oz (69.967 kg)  01/08/15 158 lb 14.4 oz (72.077 kg)

## 2015-03-10 NOTE — Progress Notes (Signed)
  Radiation Oncology         [phone] ________________________________  Name: Kelli Owens MRN: [id number]  Date: 03/10/2015  DOB: Jun 23, 1941  Electron beam Simulation Verification Note    ICD-9-CM ICD-10-CM   1. Breast cancer of upper-inner quadrant of right female breast 174.2 C50.211     Status: outpatient  NARRATIVE: The patient was brought to the treatment unit and placed in the planned treatment position. The clinical setup was verified. Then port films were obtained and uploaded to the radiation oncology medical record software.  The treatment beams were carefully compared against the planned radiation fields. The position location and shape of the radiation fields was reviewed. They targeted volume of tissue appears to be appropriately covered by the radiation beams. Organs at risk appear to be excluded as planned.  Based on my personal review, I approved the simulation verification. The patient's treatment will proceed as planned.  -----------------------------------  Blair Promise, PhD, MD

## 2015-03-10 NOTE — Progress Notes (Signed)
  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   Current Dose: 32.04 Gy     Planned Dose:  52.72 Gy  Narrative . . . . . . . . The patient presents for routine under treatment assessment.                                   The patient is without complaint.                                 Set-up films were reviewed.                                 The chart was checked. Physical Findings. . .  weight is 157 lb (71.215 kg). Her oral temperature is 97.5 F (36.4 C). Her blood pressure is 132/48 and her pulse is 60. Her respiration is 16. . The lungs are clear. The heart has a regular rhythm and rate. The right breast area shows mild erythema Impression . . . . . . . The patient is tolerating radiation. Plan . . . . . . . . . . . . Continue treatment as planned.  ________________________________   Blair Promise, PhD, MD

## 2015-03-11 ENCOUNTER — Ambulatory Visit
Admission: RE | Admit: 2015-03-11 | Discharge: 2015-03-11 | Disposition: A | Discharge status: Home / Self Care | Payer: PPO | Source: Ambulatory Visit | Attending: Radiation Oncology | Admitting: Radiation Oncology

## 2015-03-11 DIAGNOSIS — C50211 Malignant neoplasm of upper-inner quadrant of right female breast: Secondary | ICD-10-CM | POA: Diagnosis not present

## 2015-03-12 ENCOUNTER — Ambulatory Visit
Admission: RE | Admit: 2015-03-12 | Discharge: 2015-03-12 | Disposition: A | Discharge status: Home / Self Care | Payer: PPO | Source: Ambulatory Visit | Attending: Radiation Oncology | Admitting: Radiation Oncology

## 2015-03-12 ENCOUNTER — Ambulatory Visit (HOSPITAL_BASED_OUTPATIENT_CLINIC_OR_DEPARTMENT_OTHER): Payer: PPO | Admitting: Hematology and Oncology

## 2015-03-12 ENCOUNTER — Other Ambulatory Visit (HOSPITAL_BASED_OUTPATIENT_CLINIC_OR_DEPARTMENT_OTHER): Payer: PPO

## 2015-03-12 ENCOUNTER — Ambulatory Visit (HOSPITAL_BASED_OUTPATIENT_CLINIC_OR_DEPARTMENT_OTHER): Payer: PPO

## 2015-03-12 VITALS — BP 123/59 | HR 66 | Temp 97.4°F | Resp 17 | Ht 61.0 in | Wt 157.2 lb

## 2015-03-12 DIAGNOSIS — Z5112 Encounter for antineoplastic immunotherapy: Secondary | ICD-10-CM

## 2015-03-12 DIAGNOSIS — C50211 Malignant neoplasm of upper-inner quadrant of right female breast: Secondary | ICD-10-CM

## 2015-03-12 DIAGNOSIS — C50111 Malignant neoplasm of central portion of right female breast: Secondary | ICD-10-CM

## 2015-03-12 LAB — COMPREHENSIVE METABOLIC PANEL (CC13)
ALBUMIN: 3.5 g/dL (ref 3.5–5.0)
ALT: 12 U/L (ref 0–55)
AST: 21 U/L (ref 5–34)
Alkaline Phosphatase: 64 U/L (ref 40–150)
Anion Gap: 9 mEq/L (ref 3–11)
BUN: 15.6 mg/dL (ref 7.0–26.0)
CO2: 26 meq/L (ref 22–29)
Calcium: 8.9 mg/dL (ref 8.4–10.4)
Chloride: 108 mEq/L (ref 98–109)
Creatinine: 1 mg/dL (ref 0.6–1.1)
EGFR: 57 mL/min/{1.73_m2} — ABNORMAL LOW (ref >90)
Glucose: 79 mg/dl (ref 70–140)
POTASSIUM: 4.1 meq/L (ref 3.5–5.1)
SODIUM: 143 meq/L (ref 136–145)
TOTAL PROTEIN: 6.3 g/dL — AB (ref 6.4–8.3)
Total Bilirubin: 0.29 mg/dL (ref 0.20–1.20)

## 2015-03-12 LAB — CBC WITH DIFFERENTIAL/PLATELET
BASO%: 0.5 % (ref 0.0–2.0)
Basophils Absolute: 0 10*3/uL (ref 0.0–0.1)
EOS ABS: 0.1 10*3/uL (ref 0.0–0.5)
EOS%: 4.3 % (ref 0.0–7.0)
HCT: 32.5 % — ABNORMAL LOW (ref 34.8–46.6)
HGB: 11 g/dL — ABNORMAL LOW (ref 11.6–15.9)
LYMPH%: 29.8 % (ref 14.0–49.7)
MCH: 30.5 pg (ref 25.1–34.0)
MCHC: 33.8 g/dL (ref 31.5–36.0)
MCV: 90.4 fL (ref 79.5–101.0)
MONO#: 0.4 10*3/uL (ref 0.1–0.9)
MONO%: 11.1 % (ref 0.0–14.0)
NEUT%: 54.3 % (ref 38.4–76.8)
NEUTROS ABS: 1.8 10*3/uL (ref 1.5–6.5)
PLATELETS: 214 10*3/uL (ref 145–400)
RBC: 3.59 10*6/uL — AB (ref 3.70–5.45)
RDW: 12.5 % (ref 11.2–14.5)
WBC: 3.4 10*3/uL — AB (ref 3.9–10.3)
lymph#: 1 10*3/uL (ref 0.9–3.3)

## 2015-03-12 MED ORDER — ACETAMINOPHEN 325 MG PO TABS
ORAL_TABLET | ORAL | Status: AC
  Filled 2015-03-12: qty 2

## 2015-03-12 MED ORDER — DIPHENHYDRAMINE HCL 25 MG PO CAPS
50.0000 mg | ORAL_CAPSULE | Freq: Once | ORAL | Status: AC
  Administered 2015-03-12: 50 mg via ORAL

## 2015-03-12 MED ORDER — ACETAMINOPHEN 325 MG PO TABS
650.0000 mg | ORAL_TABLET | Freq: Once | ORAL | Status: AC
  Administered 2015-03-12: 650 mg via ORAL

## 2015-03-12 MED ORDER — SODIUM CHLORIDE 0.9 % IV SOLN
6.0000 mg/kg | Freq: Once | INTRAVENOUS | Status: AC
  Administered 2015-03-12: 420 mg via INTRAVENOUS
  Filled 2015-03-12: qty 20

## 2015-03-12 MED ORDER — DIPHENHYDRAMINE HCL 25 MG PO CAPS
ORAL_CAPSULE | ORAL | Status: AC
  Filled 2015-03-12: qty 2

## 2015-03-12 MED ORDER — HEPARIN SOD (PORK) LOCK FLUSH 100 UNIT/ML IV SOLN
500.0000 [IU] | Freq: Once | INTRAVENOUS | Status: AC | PRN
  Administered 2015-03-12: 500 [IU]
  Filled 2015-03-12: qty 5

## 2015-03-12 MED ORDER — SODIUM CHLORIDE 0.9 % IJ SOLN
10.0000 mL | INTRAMUSCULAR | Status: DC | PRN
  Administered 2015-03-12: 10 mL
  Filled 2015-03-12: qty 10

## 2015-03-12 MED ORDER — SODIUM CHLORIDE 0.9 % IV SOLN
Freq: Once | INTRAVENOUS | Status: AC
  Administered 2015-03-12: 10:00:00 via INTRAVENOUS

## 2015-03-12 NOTE — Progress Notes (Signed)
Patient Care Team: Crist Infante, MD as PCP - General (Internal Medicine) Alphonsa Overall, MD as Consulting Physician (General Surgery) Nicholas Lose, MD as Consulting Physician (Hematology and Oncology) Gery Pray, MD as Consulting Physician (Radiation Oncology)  DIAGNOSIS: Breast cancer of upper-inner quadrant of right female breast   Staging form: Breast, AJCC 7th Edition     Clinical: Stage IIA (T2, N0, cM0) - Signed by Rulon Eisenmenger, MD on 08/13/2014     Pathologic stage from 01/14/2015: yT0, N0, cM0 - Signed by Enid Cutter, MD on 01/21/2015       Staging comments: Staged on final lumpectomy specimen by Dr. Lyndon Code.    SUMMARY OF ONCOLOGIC HISTORY:   Breast cancer of upper-inner quadrant of right female breast   07/17/2014 Mammogram Right breast mass at 3:00 position by ultrasound measured 2.4 cm   08/06/2014 Initial Biopsy 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 ratio 2.75, Ki-67 80%   09/04/2014 -  Neo-Adjuvant Chemotherapy Neoadjuvant Taxotere, carboplatin, Herceptin and Perjeta, plan is for 6 cycles followed by Herceptin maintenance for a year; carboplatin discontinued after cycle 2, Taxotere dose reduced with cycle 3   09/06/2014 - 09/11/2014 Hospital Admission Hospitalization for pneumonia   12/25/2014 Breast MRI Post neoadjuvant MRI revealed no residual enhancement or mass in the area of the known right breast cancer   01/13/2015 Surgery Complete Pathologic Response    CHIEF COMPLIANT:  Maintenance Herceptin, ongoing radiation  INTERVAL HISTORY: Kelli Owens is a 74 year old with above-mentioned history right breast cancer treated with new adjuvant chemotherapy followed by surgery which showed a complete pathologic response. She is currently on maintenance Herceptin treatment. She is also currently undergoing radiation therapy. Radiation completed June 6. She is doing extremely well from it. She denies any issues with Herceptin either. Denies any  nausea vomiting or diarrhea.  REVIEW OF SYSTEMS:   Constitutional: Denies fevers, chills or abnormal weight loss Eyes: Denies blurriness of vision Ears, nose, mouth, throat, and face: Denies mucositis or sore throat Respiratory: Denies cough, dyspnea or wheezes Cardiovascular: Denies palpitation, chest discomfort or lower extremity swelling Gastrointestinal:  Denies nausea, heartburn or change in bowel habits Skin: Denies abnormal skin rashes Lymphatics: Denies new lymphadenopathy or easy bruising Neurological:Denies numbness, tingling or new weaknesses Behavioral/Psych: Mood is stable, no new changes  Breast:  denies any pain or lumps or nodules in either breasts All other systems were reviewed with the patient and are negative.  I have reviewed the past medical history, past surgical history, social history and family history with the patient and they are unchanged from previous note.  ALLERGIES:  has No Known Allergies.  MEDICATIONS:  Current Outpatient Prescriptions  Medication Sig Dispense Refill  . cyanocobalamin (,VITAMIN B-12,) 1000 MCG/ML injection Inject 1,000 mcg into the muscle every 30 (thirty) days.    Marland Kitchen HYDROcodone-acetaminophen (NORCO/VICODIN) 5-325 MG per tablet Take 1-2 tablets by mouth every 6 (six) hours as needed. 30 tablet 0  . lidocaine-prilocaine (EMLA) cream Apply 1 application topically as needed. 30 g 0  . LORazepam (ATIVAN) 0.5 MG tablet Take 1 tablet (0.5 mg total) by mouth every 8 (eight) hours as needed for anxiety. 30 tablet 0  . LYRICA 75 MG capsule Take 75 mg by mouth 3 (three) times daily as needed (for pain).   0  . Multiple Vitamin (MULTIVITAMIN WITH MINERALS) TABS tablet Take 1 tablet by mouth daily.    . non-metallic deodorant Jethro Poling) MISC Apply 1 application topically daily as needed.    Marland Kitchen  ondansetron (ZOFRAN) 8 MG tablet Take 1 tablet (8 mg total) by mouth every 8 (eight) hours as needed for nausea or vomiting. (Patient not taking: Reported on  03/03/2015) 30 tablet 1  . pantoprazole (PROTONIX) 40 MG tablet Take 40 mg by mouth daily as needed.    . prochlorperazine (COMPAZINE) 10 MG tablet   0  . temazepam (RESTORIL) 30 MG capsule Take 30 mg by mouth at bedtime as needed for sleep.    . Vitamin D, Ergocalciferol, (DRISDOL) 50000 UNITS CAPS Take 50,000 Units by mouth every 7 (seven) days. Takes on Thursday or Friday    . Wound Cleansers (RADIAPLEX EX) Apply topically.     No current facility-administered medications for this visit.    PHYSICAL EXAMINATION: ECOG PERFORMANCE STATUS: 1 - Symptomatic but completely ambulatory  There were no vitals filed for this visit. There were no vitals filed for this visit.  GENERAL:alert, no distress and comfortable SKIN: skin color, texture, turgor are normal, no rashes or significant lesions EYES: normal, Conjunctiva are pink and non-injected, sclera clear OROPHARYNX:no exudate, no erythema and lips, buccal mucosa, and tongue normal  NECK: supple, thyroid normal size, non-tender, without nodularity LYMPH:  no palpable lymphadenopathy in the cervical, axillary or inguinal LUNGS: clear to auscultation and percussion with normal breathing effort HEART: regular rate & rhythm and no murmurs and no lower extremity edema ABDOMEN:abdomen soft, non-tender and normal bowel sounds Musculoskeletal:no cyanosis of digits and no clubbing  NEURO: alert & oriented x 3 with fluent speech, no focal motor/sensory deficits  LABORATORY DATA:  I have reviewed the data as listed   Chemistry      Component Value Date/Time   NA 145 02/19/2015 0945   NA 142 01/29/2015 0851   K 4.1 02/19/2015 0945   K 3.7 01/29/2015 0851   CL 108 01/29/2015 0851   CO2 24 02/19/2015 0945   CO2 29 01/29/2015 0851   BUN 17.7 02/19/2015 0945   BUN 20 01/29/2015 0851   CREATININE 1.0 02/19/2015 0945   CREATININE 1.06 01/29/2015 0851      Component Value Date/Time   CALCIUM 9.5 02/19/2015 0945   CALCIUM 9.5 01/29/2015 0851    ALKPHOS 72 02/19/2015 0945   ALKPHOS 72 01/29/2015 0851   AST 17 02/19/2015 0945   AST 20 01/29/2015 0851   ALT 16 02/19/2015 0945   ALT 15 01/29/2015 0851   BILITOT 0.24 02/19/2015 0945   BILITOT 0.2* 01/29/2015 0851       Lab Results  Component Value Date   WBC 3.4* 03/12/2015   HGB 11.0* 03/12/2015   HCT 32.5* 03/12/2015   MCV 90.4 03/12/2015   PLT 214 03/12/2015   NEUTROABS 1.8 03/12/2015   ASSESSMENT & PLAN:  Breast cancer of upper-inner quadrant of right female breast Right breast invasive mammary cancer most likely ductal phenotype grade 3, 2.4 cm mass at 3:00 position ER negative PR negative HER-2 positive ratio 2.75, Ki-67 80% clinical stage IIA, T2, N0, M0  Treatment summary: Neoadjuvant chemotherapy started 09/04/2014 with TCHP completed Cycle 6 of Herceptin Perjeta as of 12/18/2014 (Taxotere discontinued after cycle 4; carboplatin discontinued after cycle 1 Pathology Review: S/P lumpectomy: Showed pathologic CR, 0/1 LN Echocardiogram April 2016 shows ejection fraction 60-65% same as in November 2015  Plan: 1.Maintenance Herceptin 2. Adjuvant XRT: On radiation oncology   Monitoring closely for toxicities to Herceptin Follow up Q 6 weeks  No orders of the defined types were placed in this encounter.   The patient has  a good understanding of the overall plan. she agrees with it. she will call with any problems that may develop before the next visit here.   Rulon Eisenmenger, MD

## 2015-03-12 NOTE — Patient Instructions (Signed)

## 2015-03-12 NOTE — Assessment & Plan Note (Signed)
Right breast invasive mammary cancer most likely ductal phenotype grade 3, 2.4 cm mass at 3:00 position ER negative PR negative HER-2 positive ratio 2.75, Ki-67 80% clinical stage IIA, T2, N0, M0  Treatment summary: Neoadjuvant chemotherapy started 09/04/2014 with TCHP completed Cycle 6 of Herceptin Perjeta as of 12/18/2014 (Taxotere discontinued after cycle 4; carboplatin discontinued after cycle 1 Pathology Review: S/P lumpectomy: Showed pathologic CR, 0/1 LN Echocardiogram April 2016 shows ejection fraction 60-65% same as in November 2015  Plan: 1.Maintenance Herceptin 2. Adjuvant XRT: Will send a referral to radiation oncology   Monitoring closely for toxicities to Herceptin Follow up Q 6 weeks

## 2015-03-13 ENCOUNTER — Ambulatory Visit
Admission: RE | Admit: 2015-03-13 | Discharge: 2015-03-13 | Disposition: A | Discharge status: Home / Self Care | Payer: PPO | Source: Ambulatory Visit | Attending: Radiation Oncology | Admitting: Radiation Oncology

## 2015-03-13 DIAGNOSIS — C50211 Malignant neoplasm of upper-inner quadrant of right female breast: Secondary | ICD-10-CM | POA: Diagnosis not present

## 2015-03-17 ENCOUNTER — Ambulatory Visit
Admission: RE | Admit: 2015-03-17 | Discharge: 2015-03-17 | Disposition: A | Discharge status: Home / Self Care | Payer: PPO | Source: Ambulatory Visit | Attending: Radiation Oncology | Admitting: Radiation Oncology

## 2015-03-17 ENCOUNTER — Encounter: Payer: Self-pay | Admitting: Radiation Oncology

## 2015-03-17 VITALS — BP 121/47 | HR 64 | Temp 97.8°F | Resp 16 | Ht 61.0 in | Wt 156.3 lb

## 2015-03-17 DIAGNOSIS — L599 Disorder of the skin and subcutaneous tissue related to radiation, unspecified: Secondary | ICD-10-CM | POA: Insufficient documentation

## 2015-03-17 DIAGNOSIS — Z510 Encounter for antineoplastic radiation therapy: Secondary | ICD-10-CM | POA: Insufficient documentation

## 2015-03-17 DIAGNOSIS — C50211 Malignant neoplasm of upper-inner quadrant of right female breast: Secondary | ICD-10-CM | POA: Diagnosis not present

## 2015-03-17 MED ORDER — BIAFINE EX EMUL
Freq: Once | CUTANEOUS | Status: AC
  Administered 2015-03-17: 16:00:00 via TOPICAL

## 2015-03-17 NOTE — Addendum Note (Signed)
Encounter addended by: Jacqulyn Liner, RN on: 03/17/2015  4:05 PM<BR>     Documentation filed: Medications, Dx Association, Inpatient MAR, Orders

## 2015-03-17 NOTE — Progress Notes (Signed)
[name] has completed 16 fractions to her right breast.  She denies pain and fatigue.  The skin on her right breast is red.  She reports the skin under her right breast is itchy.  She would like to try biafine on this area.  A tube of biafine has been given along with a refill of radiaplex.  BP 121/47 mmHg  Pulse 64  Temp(Src) 97.8 F (36.6 C) (Oral)  Resp 16  Ht 5\' 1"  (1.549 m)  Wt 156 lb 4.8 oz (70.897 kg)  BMI 29.55 kg/m2

## 2015-03-17 NOTE — Progress Notes (Signed)
  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   Current Dose: 42.72 Gy     Planned Dose:  52.72 Gy  Narrative . . . . . . . . The patient presents for routine under treatment assessment.                                     She denies pain and fatigue. The skin on her right breast is red. She reports the skin under her right breast is itchy. She would like to try biafine on this area. A tube of biafine has been given along with a refill of radiaplex.                                 Set-up films were reviewed.                                 The chart was checked. Physical Findings. . .  height is 5\' 1"  (1.549 m) and weight is 156 lb 4.8 oz (70.897 kg). Her oral temperature is 97.8 F (36.6 C). Her blood pressure is 121/47 and her pulse is 64. Her respiration is 16. . The right breast area shows hyperpigmentation changes and erythema. No moist desquamation. Impression . . . . . . . The patient is tolerating radiation. Plan . . . . . . . . . . . . Continue treatment as planned. She will start her boost field tomorrow  ________________________________   Blair Promise, PhD, MD

## 2015-03-18 ENCOUNTER — Ambulatory Visit
Admission: RE | Admit: 2015-03-18 | Discharge: 2015-03-18 | Disposition: A | Discharge status: Home / Self Care | Payer: PPO | Source: Ambulatory Visit | Attending: Radiation Oncology | Admitting: Radiation Oncology

## 2015-03-18 DIAGNOSIS — C50211 Malignant neoplasm of upper-inner quadrant of right female breast: Secondary | ICD-10-CM | POA: Diagnosis not present

## 2015-03-19 ENCOUNTER — Ambulatory Visit
Admission: RE | Admit: 2015-03-19 | Discharge: 2015-03-19 | Disposition: A | Discharge status: Home / Self Care | Payer: PPO | Source: Ambulatory Visit | Attending: Radiation Oncology | Admitting: Radiation Oncology

## 2015-03-19 DIAGNOSIS — C50211 Malignant neoplasm of upper-inner quadrant of right female breast: Secondary | ICD-10-CM | POA: Diagnosis not present

## 2015-03-20 ENCOUNTER — Ambulatory Visit
Admission: RE | Admit: 2015-03-20 | Discharge: 2015-03-20 | Disposition: A | Discharge status: Home / Self Care | Payer: PPO | Source: Ambulatory Visit | Attending: Radiation Oncology | Admitting: Radiation Oncology

## 2015-03-20 DIAGNOSIS — C50211 Malignant neoplasm of upper-inner quadrant of right female breast: Secondary | ICD-10-CM | POA: Diagnosis not present

## 2015-03-23 ENCOUNTER — Ambulatory Visit
Admission: RE | Admit: 2015-03-23 | Discharge: 2015-03-23 | Disposition: A | Discharge status: Home / Self Care | Payer: PPO | Source: Ambulatory Visit | Attending: Radiation Oncology | Admitting: Radiation Oncology

## 2015-03-23 DIAGNOSIS — C50211 Malignant neoplasm of upper-inner quadrant of right female breast: Secondary | ICD-10-CM | POA: Diagnosis not present

## 2015-03-24 ENCOUNTER — Ambulatory Visit
Admission: RE | Admit: 2015-03-24 | Discharge: 2015-03-24 | Disposition: A | Discharge status: Home / Self Care | Payer: PPO | Source: Ambulatory Visit | Attending: Radiation Oncology | Admitting: Radiation Oncology

## 2015-03-24 ENCOUNTER — Encounter: Payer: Self-pay | Admitting: Radiation Oncology

## 2015-03-24 VITALS — BP 121/57 | HR 62 | Temp 97.9°F | Resp 16 | Ht 61.0 in | Wt 156.6 lb

## 2015-03-24 DIAGNOSIS — C50211 Malignant neoplasm of upper-inner quadrant of right female breast: Secondary | ICD-10-CM | POA: Insufficient documentation

## 2015-03-24 MED ORDER — RADIAPLEXRX EX GEL
Freq: Once | CUTANEOUS | Status: AC
  Administered 2015-03-24: 17:00:00 via TOPICAL

## 2015-03-24 NOTE — Addendum Note (Signed)
Encounter addended by: Jacqulyn Liner, RN on: 03/24/2015  4:37 PM<BR>     Documentation filed: Inpatient MAR

## 2015-03-24 NOTE — Addendum Note (Signed)
Encounter addended by: Jacqulyn Liner, RN on: 03/24/2015  4:35 PM<BR>     Documentation filed: Dx Association, Orders

## 2015-03-24 NOTE — Progress Notes (Signed)
Becci Batty has completed treatment to her right breast with 21 fractions.  She denies pain.  She reports fatigue.  The skin on her right breast is red.  She has 2 small areas of peeling underneath her breast.  She is using radiaplex.  She is requesting a refill and another tube has been given.  She also uses neosporin under her right arm and breast.  She reports these areas are very tender.  She has been given a one month follow up appointment card.  BP 121/57 mmHg  Pulse 62  Temp(Src) 97.9 F (36.6 C) (Oral)  Resp 16  Ht 5\' 1"  (1.549 m)  Wt 156 lb 9.6 oz (71.033 kg)  BMI 29.60 kg/m2

## 2015-03-24 NOTE — Progress Notes (Signed)
  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   Current Dose: 52.72 Gy     Planned Dose:  52.72 Gy  Narrative . . . . . . . . The patient presents for routine under treatment assessment.                                   She is happy to complete her radiation therapy today.  She denies pain. She reports fatigue. The skin on her right breast is red. She has 2 small areas of peeling underneath her breast. She is using radiaplex. She is requesting a refill and another tube has been given. She also uses neosporin under her right arm and breast. She reports these areas are very tender.                                  Set-up films were reviewed.                                 The chart was checked. Physical Findings. . .  height is 5\' 1"  (1.549 m) and weight is 156 lb 9.6 oz (71.033 kg). Her oral temperature is 97.9 F (36.6 C). Her blood pressure is 121/57 and her pulse is 62. Her respiration is 16. . Erythema particularly in the low axillary area. Some small areas of moist desquamation in the inframammary fold. Impression . . . . . . . The patient is tolerating radiation. Plan . . . . . . . . . . . . Routine follow-up in one month. She will continue using a Triple Antibiotic ointment for her small areas of skin breakdown  ________________________________   Blair Promise, PhD, MD

## 2015-04-01 ENCOUNTER — Ambulatory Visit
Admission: RE | Admit: 2015-04-01 | Discharge: 2015-04-01 | Disposition: A | Discharge status: Home / Self Care | Payer: PPO | Source: Ambulatory Visit | Attending: Radiation Oncology | Admitting: Radiation Oncology

## 2015-04-01 ENCOUNTER — Encounter: Payer: PPO | Admitting: Nurse Practitioner

## 2015-04-01 ENCOUNTER — Telehealth: Payer: Self-pay | Admitting: Oncology

## 2015-04-01 ENCOUNTER — Encounter: Payer: Self-pay | Admitting: Oncology

## 2015-04-01 ENCOUNTER — Telehealth: Payer: Self-pay | Admitting: *Deleted

## 2015-04-01 VITALS — BP 118/79 | HR 74 | Temp 98.0°F | Resp 16 | Wt 157.8 lb

## 2015-04-01 DIAGNOSIS — C50211 Malignant neoplasm of upper-inner quadrant of right female breast: Secondary | ICD-10-CM | POA: Insufficient documentation

## 2015-04-01 HISTORY — DX: Reserved for inherently not codable concepts without codable children: IMO0001

## 2015-04-01 HISTORY — DX: Reserved for concepts with insufficient information to code with codable children: IMO0002

## 2015-04-01 MED ORDER — OXYCODONE-ACETAMINOPHEN 5-325 MG PO TABS: 1.0000 | ORAL_TABLET | ORAL | Status: DC | PRN

## 2015-04-01 MED ORDER — SILVER SULFADIAZINE 1 % EX CREA
TOPICAL_CREAM | Freq: Three times a day (TID) | CUTANEOUS | Status: DC
  Administered 2015-04-01: 13:00:00 via TOPICAL

## 2015-04-01 NOTE — Progress Notes (Signed)
Applied silvadene to right breast desquamated area. Covered with non stick dressing.  Provided patient with silvadene, sterile q-tips and non stick dressings for home use.

## 2015-04-01 NOTE — Telephone Encounter (Signed)
Kelli Owens called and said her right breast is infected on the side by her lumpectomy scar.  She has been putting antibiotic cream on it.  She denies having any fevers.  She states that she needs to see a doctor for antibiotics.  Dr. Sondra Come informed and he would like to see her today.  Called Jennessa back and she will be here in 45 minutes.  Called Selena Lesser, NP's nurse, Judson Roch, RN to cancel her appointment for today.

## 2015-04-01 NOTE — Telephone Encounter (Signed)
TC received from pt stating that she has a "burnt place" on her right breast that she noticed on Saturday.  Area is now yellow and purulent per pt.  Pt states that she has had chills and no fever that she knows of.  Pt took her temperature while on phone with this nurse and temp 96.6. Pt did take one Tylenol this morning. Pt is scheduled for Perjeta at 11:00AM on 04/02/15.    Spoke with Michel Harrow NP and advised for pt to be seen today in [facility].  Pt notified and appt made for 1:45PM today.

## 2015-04-01 NOTE — Progress Notes (Signed)
Radiation Oncology         [phone] ________________________________  Name: Kelli Owens MRN: [id number]  Date: 04/01/2015  DOB: Nov 07, 1940  Follow-Up Visit Note  CC: Jerlyn Ly, MD  Nicholas Lose, MD    ICD-9-CM ICD-10-CM   1. 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   Interval Since Last Radiation:  9  days  Narrative:  The patient requested follow-up sooner than her normal scheduled follow-up. She is noticed redness along the anterior breast and is concerned she may have an infection in this area.                              ALLERGIES:  has No Known Allergies.  Meds: Current Outpatient Prescriptions  Medication Sig Dispense Refill  . acetaminophen (TYLENOL) 325 MG tablet Take 650 mg by mouth every 6 (six) hours as needed.    . cyanocobalamin (,VITAMIN B-12,) 1000 MCG/ML injection Inject 1,000 mcg into the muscle every 30 (thirty) days.    Marland Kitchen lidocaine-prilocaine (EMLA) cream Apply 1 application topically as needed. 30 g 0  . LORazepam (ATIVAN) 0.5 MG tablet Take 1 tablet (0.5 mg total) by mouth every 8 (eight) hours as needed for anxiety. 30 tablet 0  . LYRICA 75 MG capsule Take 75 mg by mouth 3 (three) times daily as needed (for pain).   0  . Multiple Vitamin (MULTIVITAMIN WITH MINERALS) TABS tablet Take 1 tablet by mouth daily.    . ondansetron (ZOFRAN) 8 MG tablet Take 1 tablet (8 mg total) by mouth every 8 (eight) hours as needed for nausea or vomiting. 30 tablet 1  . pantoprazole (PROTONIX) 40 MG tablet Take 40 mg by mouth daily as needed.    . prochlorperazine (COMPAZINE) 10 MG tablet   0  . silver sulfADIAZINE (SILVADENE) 1 % cream Apply 1 application topically 3 (three) times daily.    . temazepam (RESTORIL) 30 MG capsule Take 30 mg by mouth at bedtime as needed for sleep.    . Vitamin D, Ergocalciferol,  (DRISDOL) 50000 UNITS CAPS Take 50,000 Units by mouth every 7 (seven) days. Takes on Thursday or Friday    . Wound Cleansers (RADIAPLEX EX) Apply topically.    Marland Kitchen emollient (BIAFINE) cream Apply topically as needed.    Marland Kitchen HYDROcodone-acetaminophen (NORCO/VICODIN) 5-325 MG per tablet Take 1-2 tablets by mouth every 6 (six) hours as needed. (Patient not taking: Reported on 03/17/2015) 30 tablet 0  . non-metallic deodorant (ALRA) MISC Apply 1 application topically daily as needed.    Marland Kitchen oxyCODONE-acetaminophen (PERCOCET/ROXICET) 5-325 MG per tablet Take 1 tablet by mouth every 4 (four) hours as needed for moderate pain or severe pain. 15 tablet 0   Current Facility-Administered Medications  Medication Dose Route Frequency Provider Last Rate Last Dose  . silver sulfADIAZINE (SILVADENE) 1 % cream   Topical TID Gery Pray, MD        Physical Findings: The patient is in no acute distress. Patient is alert and oriented.  weight is 157 lb 12.8 oz (71.578 kg). Her oral temperature is 98 F (36.7 C). Her blood pressure is 118/79 and her pulse is 74. Her respiration is 16. .  The lungs are clear. The heart has a regular rhythm and rate. Examination of the right breast reveals some moist desquamation along  the anterior portion of the breast. No signs of infection.  Lab Findings: Lab Results  Component Value Date   WBC 3.4* 03/12/2015   HGB 11.0* 03/12/2015   HCT 32.5* 03/12/2015   MCV 90.4 03/12/2015   PLT 214 03/12/2015    Radiographic Findings: No results found.  Impression:  No signs of infection within the breast. The patient has moist desquamation in the area of her boost radiation fields.  Plan:  Patient was given a small jar of Silvadene to place on the area of concern 3 times daily. The patient will return for follow-up in one week.  ____________________________________ Gery Pray, MD

## 2015-04-01 NOTE — Progress Notes (Signed)
Kelli Owens here for follow up.  She reports soreness and aching in her right breast.  She said the side of her right breast was sore over the weekend.  She reports she wore a bra on Monday that stuck to her skin.  She said it pulled her skin off.  She reports the skin was irritated and draining yellow fluid this morning.  She has been applying antibiotic cream to the area.  BP 118/79 mmHg  Pulse 74  Temp(Src) 98 F (36.7 C) (Oral)  Resp 16  Wt 157 lb 12.8 oz (71.578 kg)

## 2015-04-02 ENCOUNTER — Ambulatory Visit (HOSPITAL_BASED_OUTPATIENT_CLINIC_OR_DEPARTMENT_OTHER): Payer: PPO

## 2015-04-02 VITALS — BP 125/53 | HR 61 | Temp 97.7°F | Resp 16

## 2015-04-02 DIAGNOSIS — Z5112 Encounter for antineoplastic immunotherapy: Secondary | ICD-10-CM | POA: Diagnosis not present

## 2015-04-02 DIAGNOSIS — C50111 Malignant neoplasm of central portion of right female breast: Secondary | ICD-10-CM

## 2015-04-02 DIAGNOSIS — C50211 Malignant neoplasm of upper-inner quadrant of right female breast: Secondary | ICD-10-CM

## 2015-04-02 MED ORDER — DIPHENHYDRAMINE HCL 25 MG PO CAPS
ORAL_CAPSULE | ORAL | Status: AC
  Filled 2015-04-02: qty 2

## 2015-04-02 MED ORDER — ACETAMINOPHEN 325 MG PO TABS
ORAL_TABLET | ORAL | Status: AC
  Filled 2015-04-02: qty 2

## 2015-04-02 MED ORDER — HEPARIN SOD (PORK) LOCK FLUSH 100 UNIT/ML IV SOLN
500.0000 [IU] | Freq: Once | INTRAVENOUS | Status: AC | PRN
  Administered 2015-04-02: 500 [IU]
  Filled 2015-04-02: qty 5

## 2015-04-02 MED ORDER — SODIUM CHLORIDE 0.9 % IV SOLN
Freq: Once | INTRAVENOUS | Status: AC
  Administered 2015-04-02: 11:00:00 via INTRAVENOUS

## 2015-04-02 MED ORDER — SODIUM CHLORIDE 0.9 % IJ SOLN
10.0000 mL | INTRAMUSCULAR | Status: DC | PRN
  Administered 2015-04-02: 10 mL
  Filled 2015-04-02: qty 10

## 2015-04-02 MED ORDER — DIPHENHYDRAMINE HCL 25 MG PO CAPS
50.0000 mg | ORAL_CAPSULE | Freq: Once | ORAL | Status: AC
Start: 2015-04-02 — End: 2015-04-02
  Administered 2015-04-02: 50 mg via ORAL

## 2015-04-02 MED ORDER — ACETAMINOPHEN 325 MG PO TABS
650.0000 mg | ORAL_TABLET | Freq: Once | ORAL | Status: AC
  Administered 2015-04-02: 650 mg via ORAL

## 2015-04-02 MED ORDER — TRASTUZUMAB CHEMO INJECTION 440 MG
6.0000 mg/kg | Freq: Once | INTRAVENOUS | Status: AC
  Administered 2015-04-02: 420 mg via INTRAVENOUS
  Filled 2015-04-02: qty 20

## 2015-04-02 NOTE — Patient Instructions (Signed)
[facility] Discharge Instructions for Patients Receiving Chemotherapy  Today you received the following chemotherapy agents Herceptin  To help prevent nausea and vomiting after your treatment, we encourage you to take your nausea medication    If you develop nausea and vomiting that is not controlled by your nausea medication, call the clinic.   BELOW ARE SYMPTOMS THAT SHOULD BE REPORTED IMMEDIATELY:  *FEVER GREATER THAN 100.5 F  *CHILLS WITH OR WITHOUT FEVER  NAUSEA AND VOMITING THAT IS NOT CONTROLLED WITH YOUR NAUSEA MEDICATION  *UNUSUAL SHORTNESS OF BREATH  *UNUSUAL BRUISING OR BLEEDING  TENDERNESS IN MOUTH AND THROAT WITH OR WITHOUT PRESENCE OF ULCERS  *URINARY PROBLEMS  *BOWEL PROBLEMS  UNUSUAL RASH Items with * indicate a potential emergency and should be followed up as soon as possible.  Feel free to call the clinic you have any questions or concerns. The clinic phone number is [phone].  Please show the CHEMO ALERT CARD at check-in to the Emergency Department and triage nurse.   

## 2015-04-08 ENCOUNTER — Ambulatory Visit
Admission: RE | Admit: 2015-04-08 | Discharge: 2015-04-08 | Disposition: A | Discharge status: Home / Self Care | Payer: PPO | Source: Ambulatory Visit | Attending: Radiation Oncology | Admitting: Radiation Oncology

## 2015-04-08 ENCOUNTER — Encounter: Payer: Self-pay | Admitting: Radiation Oncology

## 2015-04-08 NOTE — Progress Notes (Signed)
Kelli Owens here for follow up.  She denies pain.  She reports feeling fatigued.  She reports her appetite is not as good and she has lost 4 lbs since 04/01/15.  She is drinking a lot of water.  The area of desquamation on her right breast is healing and scabbed.  She reports the area is itchy.  She is using silvadene twice a day.  She is also using radiaplex.  BP 119/103 mmHg  Pulse 70  Temp(Src) 98 F (36.7 C) (Oral)  Resp 16  Ht 5\' 1"  (1.549 m)  Wt 153 lb 12.8 oz (69.763 kg)  BMI 29.08 kg/m2

## 2015-04-10 ENCOUNTER — Other Ambulatory Visit: Payer: Self-pay | Admitting: *Deleted

## 2015-04-10 DIAGNOSIS — C50211 Malignant neoplasm of upper-inner quadrant of right female breast: Secondary | ICD-10-CM

## 2015-04-13 ENCOUNTER — Other Ambulatory Visit: Payer: Self-pay

## 2015-04-14 ENCOUNTER — Telehealth: Payer: Self-pay | Admitting: Hematology and Oncology

## 2015-04-14 NOTE — Telephone Encounter (Signed)
Called and left a message with her echo appointment °

## 2015-04-23 ENCOUNTER — Encounter: Payer: Self-pay | Admitting: Hematology and Oncology

## 2015-04-23 ENCOUNTER — Other Ambulatory Visit (HOSPITAL_BASED_OUTPATIENT_CLINIC_OR_DEPARTMENT_OTHER): Payer: PPO

## 2015-04-23 ENCOUNTER — Ambulatory Visit (HOSPITAL_BASED_OUTPATIENT_CLINIC_OR_DEPARTMENT_OTHER): Payer: PPO

## 2015-04-23 ENCOUNTER — Ambulatory Visit (HOSPITAL_BASED_OUTPATIENT_CLINIC_OR_DEPARTMENT_OTHER): Payer: PPO | Admitting: Hematology and Oncology

## 2015-04-23 VITALS — BP 126/57 | HR 62 | Temp 98.2°F | Resp 18 | Ht 61.0 in | Wt 154.5 lb

## 2015-04-23 DIAGNOSIS — C50211 Malignant neoplasm of upper-inner quadrant of right female breast: Secondary | ICD-10-CM

## 2015-04-23 DIAGNOSIS — Z5112 Encounter for antineoplastic immunotherapy: Secondary | ICD-10-CM

## 2015-04-23 DIAGNOSIS — Z171 Estrogen receptor negative status [ER-]: Secondary | ICD-10-CM | POA: Diagnosis not present

## 2015-04-23 DIAGNOSIS — C50111 Malignant neoplasm of central portion of right female breast: Secondary | ICD-10-CM

## 2015-04-23 LAB — COMPREHENSIVE METABOLIC PANEL (CC13)
ALK PHOS: 79 U/L (ref 40–150)
ALT: 19 U/L (ref 0–55)
ANION GAP: 7 meq/L (ref 3–11)
AST: 20 U/L (ref 5–34)
Albumin: 3.8 g/dL (ref 3.5–5.0)
BILIRUBIN TOTAL: 0.38 mg/dL (ref 0.20–1.20)
BUN: 19.8 mg/dL (ref 7.0–26.0)
CO2: 26 mEq/L (ref 22–29)
Calcium: 9.8 mg/dL (ref 8.4–10.4)
Chloride: 109 mEq/L (ref 98–109)
Creatinine: 1.1 mg/dL (ref 0.6–1.1)
EGFR: 49 mL/min/{1.73_m2} — AB (ref >90)
GLUCOSE: 87 mg/dL (ref 70–140)
Potassium: 4.1 mEq/L (ref 3.5–5.1)
Sodium: 142 mEq/L (ref 136–145)
Total Protein: 6.5 g/dL (ref 6.4–8.3)

## 2015-04-23 LAB — CBC WITH DIFFERENTIAL/PLATELET
BASO%: 0.3 % (ref 0.0–2.0)
BASOS ABS: 0 10*3/uL (ref 0.0–0.1)
EOS%: 3.5 % (ref 0.0–7.0)
Eosinophils Absolute: 0.1 10*3/uL (ref 0.0–0.5)
HCT: 31.8 % — ABNORMAL LOW (ref 34.8–46.6)
HGB: 10.7 g/dL — ABNORMAL LOW (ref 11.6–15.9)
LYMPH#: 1.2 10*3/uL (ref 0.9–3.3)
LYMPH%: 30.8 % (ref 14.0–49.7)
MCH: 31.2 pg (ref 25.1–34.0)
MCHC: 33.8 g/dL (ref 31.5–36.0)
MCV: 92.3 fL (ref 79.5–101.0)
MONO#: 0.4 10*3/uL (ref 0.1–0.9)
MONO%: 10.4 % (ref 0.0–14.0)
NEUT#: 2.1 10*3/uL (ref 1.5–6.5)
NEUT%: 55 % (ref 38.4–76.8)
PLATELETS: 205 10*3/uL (ref 145–400)
RBC: 3.44 10*6/uL — ABNORMAL LOW (ref 3.70–5.45)
RDW: 13.2 % (ref 11.2–14.5)
WBC: 3.8 10*3/uL — AB (ref 3.9–10.3)

## 2015-04-23 MED ORDER — DIPHENHYDRAMINE HCL 25 MG PO CAPS
50.0000 mg | ORAL_CAPSULE | Freq: Once | ORAL | Status: AC
  Administered 2015-04-23: 50 mg via ORAL

## 2015-04-23 MED ORDER — TRASTUZUMAB CHEMO INJECTION 440 MG
6.0000 mg/kg | Freq: Once | INTRAVENOUS | Status: AC
  Administered 2015-04-23: 420 mg via INTRAVENOUS
  Filled 2015-04-23: qty 20

## 2015-04-23 MED ORDER — SODIUM CHLORIDE 0.9 % IV SOLN
Freq: Once | INTRAVENOUS | Status: AC
  Administered 2015-04-23: 12:00:00 via INTRAVENOUS

## 2015-04-23 MED ORDER — ACETAMINOPHEN 325 MG PO TABS
ORAL_TABLET | ORAL | Status: AC
  Filled 2015-04-23: qty 2

## 2015-04-23 MED ORDER — DIPHENHYDRAMINE HCL 25 MG PO CAPS
ORAL_CAPSULE | ORAL | Status: AC
  Filled 2015-04-23: qty 2

## 2015-04-23 MED ORDER — ACETAMINOPHEN 325 MG PO TABS
650.0000 mg | ORAL_TABLET | Freq: Once | ORAL | Status: AC
Start: 2015-04-23 — End: 2015-04-23
  Administered 2015-04-23: 650 mg via ORAL

## 2015-04-23 MED ORDER — SODIUM CHLORIDE 0.9 % IJ SOLN
10.0000 mL | INTRAMUSCULAR | Status: DC | PRN
  Administered 2015-04-23: 10 mL
  Filled 2015-04-23: qty 10

## 2015-04-23 MED ORDER — HEPARIN SOD (PORK) LOCK FLUSH 100 UNIT/ML IV SOLN
500.0000 [IU] | Freq: Once | INTRAVENOUS | Status: AC | PRN
  Administered 2015-04-23: 500 [IU]
  Filled 2015-04-23: qty 5

## 2015-04-23 NOTE — Patient Instructions (Signed)
[facility] Discharge Instructions for Patients Receiving Chemotherapy  Today you received the following: Herceptin   To help prevent nausea and vomiting after your treatment, we encourage you to take your nausea medication as directed.    If you develop nausea and vomiting that is not controlled by your nausea medication, call the clinic.   BELOW ARE SYMPTOMS THAT SHOULD BE REPORTED IMMEDIATELY:  *FEVER GREATER THAN 100.5 F  *CHILLS WITH OR WITHOUT FEVER  NAUSEA AND VOMITING THAT IS NOT CONTROLLED WITH YOUR NAUSEA MEDICATION  *UNUSUAL SHORTNESS OF BREATH  *UNUSUAL BRUISING OR BLEEDING  TENDERNESS IN MOUTH AND THROAT WITH OR WITHOUT PRESENCE OF ULCERS  *URINARY PROBLEMS  *BOWEL PROBLEMS  UNUSUAL RASH Items with * indicate a potential emergency and should be followed up as soon as possible.  Feel free to call the clinic you have any questions or concerns. The clinic phone number is [phone].  Please show the CHEMO ALERT CARD at check-in to the Emergency Department and triage nurse.   

## 2015-04-23 NOTE — Assessment & Plan Note (Signed)
Right breast invasive mammary cancer most likely ductal phenotype grade 3, 2.4 cm mass at 3:00 position ER negative PR negative HER-2 positive ratio 2.75, Ki-67 80% clinical stage IIA, T2, N0, M0  Treatment summary: Neoadjuvant chemotherapy started 09/04/2014 with TCHP completed Cycle 6 of Herceptin Perjeta as of 12/18/2014 (Taxotere discontinued after cycle 4; carboplatin discontinued after cycle 1 Pathology Review: S/P lumpectomy: Showed pathologic CR, 0/1 LN Echocardiogram April 2016 shows ejection fraction 60-65% same as in November 2015 ( next echo 04/27/2015)  Plan: 1.Maintenance Herceptin 2.  No role of antiestrogen therapy because she is ER/PR negative  Monitoring closely for toxicities to Herceptin  Every 3 weeks Follow up Q 6 weeks

## 2015-04-23 NOTE — Progress Notes (Signed)
Patient Care Team: Crist Infante, MD as PCP - General (Internal Medicine) Alphonsa Overall, MD as Consulting Physician (General Surgery) Nicholas Lose, MD as Consulting Physician (Hematology and Oncology) Gery Pray, MD as Consulting Physician (Radiation Oncology)  DIAGNOSIS: Breast cancer of upper-inner quadrant of right female breast   Staging form: Breast, AJCC 7th Edition     Clinical: Stage IIA (T2, N0, cM0) - Signed by Rulon Eisenmenger, MD on 08/13/2014     Pathologic stage from 01/14/2015: yT0, N0, cM0 - Signed by Enid Cutter, MD on 01/21/2015       Staging comments: Staged on final lumpectomy specimen by Dr. Lyndon Code.    SUMMARY OF ONCOLOGIC HISTORY:   Breast cancer of upper-inner quadrant of right female breast   07/17/2014 Mammogram Right breast mass at 3:00 position by ultrasound measured 2.4 cm   08/06/2014 Initial Biopsy 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 ratio 2.75, Ki-67 80%   09/04/2014 -  Neo-Adjuvant Chemotherapy Neoadjuvant Taxotere, carboplatin, Herceptin and Perjeta, plan is for 6 cycles followed by Herceptin maintenance for a year; carboplatin discontinued after cycle 2, Taxotere dose reduced with cycle 3   09/06/2014 - 09/11/2014 Hospital Admission Hospitalization for pneumonia   12/25/2014 Breast MRI Post neoadjuvant MRI revealed no residual enhancement or mass in the area of the known right breast cancer   01/13/2015 Surgery Complete Pathologic Response   02/20/2015 - 03/24/2015 Radiation Therapy Adjuvant XRT Dr. Sondra Barges    CHIEF COMPLIANT: follow-up on Herceptin  INTERVAL HISTORY: Kelli Owens is a 74 year old with above-mentioned history of right breast cancer currently on maintenance Herceptin. She is tolerating it fairly well without any problems or concerns. She has no echocardiogram coming up. She has noticed that she has a company emotionally labile and small things appear to be bothering her. She understands that she needs to quit  worrying about things. She is also fairly fatigued and does not do much activity other than staying at home and walking around in the house. She plans to go back to the gym.  REVIEW OF SYSTEMS:   Constitutional: Denies fevers, chills or abnormal weight loss, complains of fatigue Eyes: Denies blurriness of vision Ears, nose, mouth, throat, and face: Denies mucositis or sore throat Respiratory: Denies cough, dyspnea or wheezes Cardiovascular: Denies palpitation, chest discomfort or lower extremity swelling Gastrointestinal:  Denies nausea, heartburn or change in bowel habits Skin: Denies abnormal skin rashes Lymphatics: Denies new lymphadenopathy or easy bruising Neurological:Denies numbness, tingling or new weaknesses Behavioral/Psych: Mood is stable, no new changes  Breast:  denies any pain or lumps or nodules in either breasts All other systems were reviewed with the patient and are negative.  I have reviewed the past medical history, past surgical history, social history and family history with the patient and they are unchanged from previous note.  ALLERGIES:  has No Known Allergies.  MEDICATIONS:  Current Outpatient Prescriptions  Medication Sig Dispense Refill  . acetaminophen (TYLENOL) 325 MG tablet Take 650 mg by mouth every 6 (six) hours as needed.    . cyanocobalamin (,VITAMIN B-12,) 1000 MCG/ML injection Inject 1,000 mcg into the muscle every 30 (thirty) days.    Marland Kitchen emollient (BIAFINE) cream Apply topically as needed.    Marland Kitchen HYDROcodone-acetaminophen (NORCO/VICODIN) 5-325 MG per tablet Take 1-2 tablets by mouth every 6 (six) hours as needed. 30 tablet 0  . lidocaine-prilocaine (EMLA) cream Apply 1 application topically as needed. 30 g 0  . LORazepam (ATIVAN) 0.5 MG tablet Take  1 tablet (0.5 mg total) by mouth every 8 (eight) hours as needed for anxiety. 30 tablet 0  . LYRICA 75 MG capsule Take 75 mg by mouth 3 (three) times daily as needed (for pain).   0  . Multiple Vitamin  (MULTIVITAMIN WITH MINERALS) TABS tablet Take 1 tablet by mouth daily.    . non-metallic deodorant Jethro Poling) MISC Apply 1 application topically daily as needed.    . ondansetron (ZOFRAN) 8 MG tablet Take 1 tablet (8 mg total) by mouth every 8 (eight) hours as needed for nausea or vomiting. 30 tablet 1  . oxyCODONE-acetaminophen (PERCOCET/ROXICET) 5-325 MG per tablet Take 1 tablet by mouth every 4 (four) hours as needed for moderate pain or severe pain. 15 tablet 0  . pantoprazole (PROTONIX) 40 MG tablet Take 40 mg by mouth daily as needed.    . prochlorperazine (COMPAZINE) 10 MG tablet   0  . silver sulfADIAZINE (SILVADENE) 1 % cream Apply 1 application topically 3 (three) times daily.    . temazepam (RESTORIL) 30 MG capsule Take 30 mg by mouth at bedtime as needed for sleep.    . Vitamin D, Ergocalciferol, (DRISDOL) 50000 UNITS CAPS Take 50,000 Units by mouth every 7 (seven) days. Takes on Thursday or Friday    . Wound Cleansers (RADIAPLEX EX) Apply topically.     No current facility-administered medications for this visit.    PHYSICAL EXAMINATION: ECOG PERFORMANCE STATUS: 1 - Symptomatic but completely ambulatory  Filed Vitals:   04/23/15 1021  BP: 126/57  Pulse: 62  Temp: 98.2 F (36.8 C)  Resp: 18   Filed Weights   04/23/15 1021  Weight: 154 lb 8 oz (70.081 kg)    GENERAL:alert, no distress and comfortable SKIN: skin color, texture, turgor are normal, no rashes or significant lesions EYES: normal, Conjunctiva are pink and non-injected, sclera clear OROPHARYNX:no exudate, no erythema and lips, buccal mucosa, and tongue normal  NECK: supple, thyroid normal size, non-tender, without nodularity LYMPH:  no palpable lymphadenopathy in the cervical, axillary or inguinal LUNGS: clear to auscultation and percussion with normal breathing effort HEART: regular rate & rhythm and no murmurs and no lower extremity edema ABDOMEN:abdomen soft, non-tender and normal bowel  sounds Musculoskeletal:no cyanosis of digits and no clubbing  NEURO: alert & oriented x 3 with fluent speech, no focal motor/sensory deficits  LABORATORY DATA:  I have reviewed the data as listed   Chemistry      Component Value Date/Time   NA 142 04/23/2015 0949   NA 142 01/29/2015 0851   K 4.1 04/23/2015 0949   K 3.7 01/29/2015 0851   CL 108 01/29/2015 0851   CO2 26 04/23/2015 0949   CO2 29 01/29/2015 0851   BUN 19.8 04/23/2015 0949   BUN 20 01/29/2015 0851   CREATININE 1.1 04/23/2015 0949   CREATININE 1.06 01/29/2015 0851      Component Value Date/Time   CALCIUM 9.8 04/23/2015 0949   CALCIUM 9.5 01/29/2015 0851   ALKPHOS 79 04/23/2015 0949   ALKPHOS 72 01/29/2015 0851   AST 20 04/23/2015 0949   AST 20 01/29/2015 0851   ALT 19 04/23/2015 0949   ALT 15 01/29/2015 0851   BILITOT 0.38 04/23/2015 0949   BILITOT 0.2* 01/29/2015 0851       Lab Results  Component Value Date   WBC 3.8* 04/23/2015   HGB 10.7* 04/23/2015   HCT 31.8* 04/23/2015   MCV 92.3 04/23/2015   PLT 205 04/23/2015   NEUTROABS 2.1 04/23/2015  ASSESSMENT & PLAN:  Breast cancer of upper-inner quadrant of right female breast Right breast invasive mammary cancer most likely ductal phenotype grade 3, 2.4 cm mass at 3:00 position ER negative PR negative HER-2 positive ratio 2.75, Ki-67 80% clinical stage IIA, T2, N0, M0  Treatment summary: Neoadjuvant chemotherapy started 09/04/2014 with TCHP completed Cycle 6 of Herceptin Perjeta as of 12/18/2014 (Taxotere discontinued after cycle 4; carboplatin discontinued after cycle 1 Pathology Review: S/P lumpectomy: Showed pathologic CR, 0/1 LN Echocardiogram April 2016 shows ejection fraction 60-65% same as in November 2015 ( next echo 04/27/2015)  Plan: 1.Maintenance Herceptin 2.  No role of antiestrogen therapy because she is ER/PR negative  Monitoring closely for toxicities to Herceptin  Every 3 weeks Follow up Q 6 weeks   No orders of the defined types  were placed in this encounter.   The patient has a good understanding of the overall plan. she agrees with it. she will call with any problems that may develop before the next visit here.   Rulon Eisenmenger, MD

## 2015-04-27 ENCOUNTER — Ambulatory Visit (HOSPITAL_COMMUNITY)
Admission: RE | Admit: 2015-04-27 | Discharge: 2015-04-27 | Disposition: A | Discharge status: Home / Self Care | Payer: PPO | Source: Ambulatory Visit | Attending: Hematology and Oncology | Admitting: Hematology and Oncology

## 2015-04-27 DIAGNOSIS — C50211 Malignant neoplasm of upper-inner quadrant of right female breast: Secondary | ICD-10-CM

## 2015-04-27 DIAGNOSIS — D6481 Anemia due to antineoplastic chemotherapy: Secondary | ICD-10-CM | POA: Diagnosis not present

## 2015-04-29 ENCOUNTER — Ambulatory Visit
Admission: RE | Admit: 2015-04-29 | Discharge: 2015-04-29 | Disposition: A | Discharge status: Home / Self Care | Payer: PPO | Source: Ambulatory Visit | Attending: Radiation Oncology | Admitting: Radiation Oncology

## 2015-04-29 ENCOUNTER — Encounter: Payer: Self-pay | Admitting: Radiation Oncology

## 2015-04-29 VITALS — BP 123/49 | HR 67 | Temp 97.7°F | Resp 16 | Ht 61.0 in | Wt 153.6 lb

## 2015-04-29 DIAGNOSIS — C50211 Malignant neoplasm of upper-inner quadrant of right female breast: Secondary | ICD-10-CM

## 2015-04-29 NOTE — Progress Notes (Signed)
Radiation Oncology         [phone] ________________________________  Name: Kelli Owens MRN: [id number]  Date: 04/29/2015  DOB: August 30, 1941  Follow-Up Visit Note  CC: Jerlyn Ly, MD  Nicholas Lose, MD  No diagnosis found.  Diagnosis: IIa invasive ductal carcinoma in the right breast.   Interval Since Last Radiation:  3 weeks   Narrative:  The patient returns today for routine follow-up.  She denies pain. She reports her energy level is improving. She reports feeling anxious because she is caring for her husband with parkinson's. The skin on her right breast has hyperpigmentation. She is using biafine and silvadene occasionally. She will have her next Perjeta injection on 05/14/15.                               ALLERGIES:  has No Known Allergies.  Meds: Current Outpatient Prescriptions  Medication Sig Dispense Refill  . acetaminophen (TYLENOL) 325 MG tablet Take 650 mg by mouth every 6 (six) hours as needed.    . cyanocobalamin (,VITAMIN B-12,) 1000 MCG/ML injection Inject 1,000 mcg into the muscle every 30 (thirty) days.    Marland Kitchen lidocaine-prilocaine (EMLA) cream Apply 1 application topically as needed. 30 g 0  . LORazepam (ATIVAN) 0.5 MG tablet Take 1 tablet (0.5 mg total) by mouth every 8 (eight) hours as needed for anxiety. 30 tablet 0  . pantoprazole (PROTONIX) 40 MG tablet Take 40 mg by mouth daily as needed.    . silver sulfADIAZINE (SILVADENE) 1 % cream Apply 1 application topically 3 (three) times daily.    . temazepam (RESTORIL) 30 MG capsule Take 30 mg by mouth at bedtime as needed for sleep.    . Vitamin D, Ergocalciferol, (DRISDOL) 50000 UNITS CAPS Take 50,000 Units by mouth every 7 (seven) days. Takes on Thursday or Friday    . emollient (BIAFINE) cream Apply topically as needed.    Marland Kitchen HYDROcodone-acetaminophen (NORCO/VICODIN) 5-325 MG per tablet Take 1-2 tablets by mouth every 6 (six) hours as needed. (Patient not taking: Reported on 04/29/2015) 30 tablet 0    . LYRICA 75 MG capsule Take 75 mg by mouth 3 (three) times daily as needed (for pain).   0  . Multiple Vitamin (MULTIVITAMIN WITH MINERALS) TABS tablet Take 1 tablet by mouth daily.    . non-metallic deodorant Jethro Poling) MISC Apply 1 application topically daily as needed.    . ondansetron (ZOFRAN) 8 MG tablet Take 1 tablet (8 mg total) by mouth every 8 (eight) hours as needed for nausea or vomiting. (Patient not taking: Reported on 04/29/2015) 30 tablet 1  . oxyCODONE-acetaminophen (PERCOCET/ROXICET) 5-325 MG per tablet Take 1 tablet by mouth every 4 (four) hours as needed for moderate pain or severe pain. (Patient not taking: Reported on 04/29/2015) 15 tablet 0  . prochlorperazine (COMPAZINE) 10 MG tablet   0  . Wound Cleansers (RADIAPLEX EX) Apply topically.     No current facility-administered medications for this encounter.    Physical Findings: The patient is in no acute distress. Patient is alert and oriented.  height is 5\' 1"  (1.549 m) and weight is 153 lb 9.6 oz (69.673 kg). Her oral temperature is 97.7 F (36.5 C). Her blood pressure is 123/49 and her pulse is 67. Her respiration is 16 and oxygen saturation is 100%. .  Breast: Right breast shows no signs of infection and a well healing scar, hyperpigmentation is present, edema is present  in the nipple/areolar region.    Lab Findings: Lab Results  Component Value Date   WBC 3.8* 04/23/2015   HGB 10.7* 04/23/2015   HCT 31.8* 04/23/2015   MCV 92.3 04/23/2015   PLT 205 04/23/2015    Radiographic Findings: No results found.  Impression:  The patient is recovering from the effects of radiation.  No signs of infection, skin well-healed.  Plan:  Follow up in 6 months.   -----------------------------------  Blair Promise, PhD, MD  This document serves as a record of services personally performed by Gery Pray, MD. It was created on his behalf by Derek Mound, a trained medical scribe. The creation of this record is based on the  scribe's personal observations and the provider's statements to them. This document has been checked and approved by the attending provider.

## 2015-04-29 NOTE — Progress Notes (Signed)
Kelli Owens here for follow up.  She denies pain.  She reports her energy level is improving.  She reports feeling anxious because she is caring for her husband with parkinson's.  The skin on her right breast has hyperpigmentation. She is using biafine and silvadene occasionally.  She will have her next Perjeta injection on 05/14/15.    BP 123/49 mmHg  Pulse 67  Temp(Src) 97.7 F (36.5 C) (Oral)  Resp 16  Ht 5\' 1"  (1.549 m)  Wt 153 lb 9.6 oz (69.673 kg)  BMI 29.04 kg/m2  SpO2 100%

## 2015-04-30 ENCOUNTER — Ambulatory Visit: Payer: PPO | Admitting: Radiation Oncology

## 2015-05-10 ENCOUNTER — Encounter: Payer: Self-pay | Admitting: Radiation Oncology

## 2015-05-10 NOTE — Progress Notes (Signed)
  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       Indication for treatment:  Breast conservation therapy       Radiation treatment dates:   May 9 through June 7  Site/dose:   Right breast 42.72 gray 16 fractions, lumpectomy cavity boost 10 gray in 5 fractions  Beams/energy:   Initial treatment with 3-D conformal therapy, tangential beams encompassing the right breast, hypo-fractionated treatment;  lumpectomy cavity boost was with a custom electron cutout field. A Monte Carlo plan was generated for treatment, 15 megavoltage electrons were used for the boost field.  Narrative: The patient tolerated radiation treatment relatively well.   Patient did have some discomfort in the breast area and mild fatigue. Towards the end of her treatment she did develop some mild moist desquamation treated with a Triple Antibiotic ointment.  Plan: The patient has completed radiation treatment. The patient will return to radiation oncology clinic for routine followup in one month. I advised them to call or return sooner if they have any questions or concerns related to their recovery or treatment.  -----------------------------------  Blair Promise, PhD, MD

## 2015-05-10 NOTE — Progress Notes (Signed)
  Radiation Oncology         [phone] ________________________________  Name: Kelli Owens MRN: [id number]  Date: 02/13/15  DOB: Aug 16, 1941  Optical Surface Tracking Plan:  Since intensity modulated radiotherapy (IMRT) and 3D conformal radiation treatment methods are predicated on accurate and precise positioning for treatment, intrafraction motion monitoring is medically necessary to ensure accurate and safe treatment delivery.  The ability to quantify intrafraction motion without excessive ionizing radiation dose can only be performed with optical surface tracking. Accordingly, surface imaging offers the opportunity to obtain 3D measurements of patient position throughout IMRT and 3D treatments without excessive radiation exposure.  I am ordering optical surface tracking for this patient's upcoming course of radiotherapy. ________________________________  Gery Pray, MD 05/10/2015 12:10 PM    Reference:   Ursula Alert, J, et al. Surface imaging-based analysis of intrafraction motion for breast radiotherapy patients.Journal of [place], n. 6, nov. 2014. ISSN [id number].   Available at: <http://www.jacmp.org/index.php/jacmp/article/view/4957>.

## 2015-05-14 ENCOUNTER — Ambulatory Visit (HOSPITAL_BASED_OUTPATIENT_CLINIC_OR_DEPARTMENT_OTHER): Payer: PPO

## 2015-05-14 ENCOUNTER — Other Ambulatory Visit: Payer: Self-pay | Admitting: Hematology and Oncology

## 2015-05-14 VITALS — BP 140/61 | HR 59 | Temp 97.0°F | Resp 18

## 2015-05-14 DIAGNOSIS — C50211 Malignant neoplasm of upper-inner quadrant of right female breast: Secondary | ICD-10-CM

## 2015-05-14 DIAGNOSIS — Z5112 Encounter for antineoplastic immunotherapy: Secondary | ICD-10-CM

## 2015-05-14 DIAGNOSIS — N644 Mastodynia: Secondary | ICD-10-CM

## 2015-05-14 DIAGNOSIS — C50111 Malignant neoplasm of central portion of right female breast: Secondary | ICD-10-CM | POA: Diagnosis not present

## 2015-05-14 DIAGNOSIS — C50911 Malignant neoplasm of unspecified site of right female breast: Secondary | ICD-10-CM

## 2015-05-14 DIAGNOSIS — Z9889 Other specified postprocedural states: Secondary | ICD-10-CM

## 2015-05-14 MED ORDER — SODIUM CHLORIDE 0.9 % IV SOLN
Freq: Once | INTRAVENOUS | Status: AC
  Administered 2015-05-14: 12:00:00 via INTRAVENOUS

## 2015-05-14 MED ORDER — DIPHENHYDRAMINE HCL 25 MG PO CAPS
50.0000 mg | ORAL_CAPSULE | Freq: Once | ORAL | Status: AC
  Administered 2015-05-14: 50 mg via ORAL

## 2015-05-14 MED ORDER — HEPARIN SOD (PORK) LOCK FLUSH 100 UNIT/ML IV SOLN
500.0000 [IU] | Freq: Once | INTRAVENOUS | Status: AC | PRN
  Administered 2015-05-14: 500 [IU]
  Filled 2015-05-14: qty 5

## 2015-05-14 MED ORDER — SODIUM CHLORIDE 0.9 % IJ SOLN
10.0000 mL | INTRAMUSCULAR | Status: DC | PRN
Start: 2015-05-14 — End: 2015-05-14
  Administered 2015-05-14: 10 mL
  Filled 2015-05-14: qty 10

## 2015-05-14 MED ORDER — ACETAMINOPHEN 325 MG PO TABS
650.0000 mg | ORAL_TABLET | Freq: Once | ORAL | Status: AC
  Administered 2015-05-14: 650 mg via ORAL

## 2015-05-14 MED ORDER — ACETAMINOPHEN 325 MG PO TABS
ORAL_TABLET | ORAL | Status: AC
  Filled 2015-05-14: qty 2

## 2015-05-14 MED ORDER — DIPHENHYDRAMINE HCL 25 MG PO CAPS
ORAL_CAPSULE | ORAL | Status: AC
Start: 2015-05-14 — End: 2015-05-14
  Filled 2015-05-14: qty 2

## 2015-05-14 MED ORDER — TRASTUZUMAB CHEMO INJECTION 440 MG
6.0000 mg/kg | Freq: Once | INTRAVENOUS | Status: AC
  Administered 2015-05-14: 420 mg via INTRAVENOUS
  Filled 2015-05-14: qty 20

## 2015-05-14 NOTE — Patient Instructions (Signed)
[facility] Discharge Instructions for Patients  Today you received the following: Herceptin   To help prevent nausea and vomiting after your treatment, we encourage you to take your nausea medication as directed.   If you develop nausea and vomiting that is not controlled by your nausea medication, call the clinic.   BELOW ARE SYMPTOMS THAT SHOULD BE REPORTED IMMEDIATELY:  *FEVER GREATER THAN 100.5 F  *CHILLS WITH OR WITHOUT FEVER  NAUSEA AND VOMITING THAT IS NOT CONTROLLED WITH YOUR NAUSEA MEDICATION  *UNUSUAL SHORTNESS OF BREATH  *UNUSUAL BRUISING OR BLEEDING  TENDERNESS IN MOUTH AND THROAT WITH OR WITHOUT PRESENCE OF ULCERS  *URINARY PROBLEMS  *BOWEL PROBLEMS  UNUSUAL RASH Items with * indicate a potential emergency and should be followed up as soon as possible.  Feel free to call the clinic you have any questions or concerns. The clinic phone number is [phone].  Please show the CHEMO ALERT CARD at check-in to the Emergency Department and triage nurse.   

## 2015-05-14 NOTE — Progress Notes (Signed)
Per Dr. Lindi Adie, okay to proceed without labwork today.  States he has labs ordered every 6 weeks.

## 2015-06-08 ENCOUNTER — Encounter: Payer: Self-pay | Admitting: Hematology and Oncology

## 2015-06-08 ENCOUNTER — Ambulatory Visit (HOSPITAL_BASED_OUTPATIENT_CLINIC_OR_DEPARTMENT_OTHER): Payer: PPO

## 2015-06-08 ENCOUNTER — Other Ambulatory Visit (HOSPITAL_BASED_OUTPATIENT_CLINIC_OR_DEPARTMENT_OTHER): Payer: PPO

## 2015-06-08 ENCOUNTER — Ambulatory Visit (HOSPITAL_BASED_OUTPATIENT_CLINIC_OR_DEPARTMENT_OTHER): Payer: PPO | Admitting: Hematology and Oncology

## 2015-06-08 ENCOUNTER — Telehealth: Payer: Self-pay | Admitting: Hematology and Oncology

## 2015-06-08 VITALS — BP 125/51 | HR 56 | Temp 97.3°F | Resp 18 | Ht 61.0 in | Wt 156.3 lb

## 2015-06-08 DIAGNOSIS — T451X5A Adverse effect of antineoplastic and immunosuppressive drugs, initial encounter: Secondary | ICD-10-CM

## 2015-06-08 DIAGNOSIS — C50111 Malignant neoplasm of central portion of right female breast: Secondary | ICD-10-CM | POA: Diagnosis not present

## 2015-06-08 DIAGNOSIS — D6481 Anemia due to antineoplastic chemotherapy: Secondary | ICD-10-CM

## 2015-06-08 DIAGNOSIS — Z5112 Encounter for antineoplastic immunotherapy: Secondary | ICD-10-CM

## 2015-06-08 DIAGNOSIS — C50211 Malignant neoplasm of upper-inner quadrant of right female breast: Secondary | ICD-10-CM

## 2015-06-08 LAB — CBC WITH DIFFERENTIAL/PLATELET
BASO%: 0.5 % (ref 0.0–2.0)
BASOS ABS: 0 10*3/uL (ref 0.0–0.1)
EOS ABS: 0.1 10*3/uL (ref 0.0–0.5)
EOS%: 2.9 % (ref 0.0–7.0)
HCT: 32.4 % — ABNORMAL LOW (ref 34.8–46.6)
HGB: 11.1 g/dL — ABNORMAL LOW (ref 11.6–15.9)
LYMPH%: 29.7 % (ref 14.0–49.7)
MCH: 32 pg (ref 25.1–34.0)
MCHC: 34.1 g/dL (ref 31.5–36.0)
MCV: 93.8 fL (ref 79.5–101.0)
MONO#: 0.4 10*3/uL (ref 0.1–0.9)
MONO%: 10.3 % (ref 0.0–14.0)
NEUT#: 2.2 10*3/uL (ref 1.5–6.5)
NEUT%: 56.6 % (ref 38.4–76.8)
Platelets: 201 10*3/uL (ref 145–400)
RBC: 3.45 10*6/uL — AB (ref 3.70–5.45)
RDW: 12.7 % (ref 11.2–14.5)
WBC: 3.8 10*3/uL — ABNORMAL LOW (ref 3.9–10.3)
lymph#: 1.1 10*3/uL (ref 0.9–3.3)

## 2015-06-08 LAB — COMPREHENSIVE METABOLIC PANEL (CC13)
ALK PHOS: 61 U/L (ref 40–150)
ALT: 15 U/L (ref 0–55)
ANION GAP: 6 meq/L (ref 3–11)
AST: 18 U/L (ref 5–34)
Albumin: 3.5 g/dL (ref 3.5–5.0)
BILIRUBIN TOTAL: 0.28 mg/dL (ref 0.20–1.20)
BUN: 21.2 mg/dL (ref 7.0–26.0)
CO2: 26 meq/L (ref 22–29)
Calcium: 9.7 mg/dL (ref 8.4–10.4)
Chloride: 111 mEq/L — ABNORMAL HIGH (ref 98–109)
Creatinine: 1.1 mg/dL (ref 0.6–1.1)
EGFR: 49 mL/min/{1.73_m2} — AB (ref >90)
Glucose: 90 mg/dl (ref 70–140)
Potassium: 4.4 mEq/L (ref 3.5–5.1)
SODIUM: 144 meq/L (ref 136–145)
TOTAL PROTEIN: 6.2 g/dL — AB (ref 6.4–8.3)

## 2015-06-08 MED ORDER — DIPHENHYDRAMINE HCL 25 MG PO CAPS
ORAL_CAPSULE | ORAL | Status: AC
  Filled 2015-06-08: qty 2

## 2015-06-08 MED ORDER — SODIUM CHLORIDE 0.9 % IV SOLN
Freq: Once | INTRAVENOUS | Status: AC
  Administered 2015-06-08: 11:00:00 via INTRAVENOUS

## 2015-06-08 MED ORDER — DIPHENHYDRAMINE HCL 25 MG PO CAPS
50.0000 mg | ORAL_CAPSULE | Freq: Once | ORAL | Status: AC
  Administered 2015-06-08: 50 mg via ORAL

## 2015-06-08 MED ORDER — ACETAMINOPHEN 325 MG PO TABS
650.0000 mg | ORAL_TABLET | Freq: Once | ORAL | Status: AC
  Administered 2015-06-08: 650 mg via ORAL

## 2015-06-08 MED ORDER — HEPARIN SOD (PORK) LOCK FLUSH 100 UNIT/ML IV SOLN
500.0000 [IU] | Freq: Once | INTRAVENOUS | Status: AC | PRN
  Administered 2015-06-08: 500 [IU]
  Filled 2015-06-08: qty 5

## 2015-06-08 MED ORDER — ACETAMINOPHEN 325 MG PO TABS
ORAL_TABLET | ORAL | Status: AC
  Filled 2015-06-08: qty 2

## 2015-06-08 MED ORDER — TRASTUZUMAB CHEMO INJECTION 440 MG
6.0000 mg/kg | Freq: Once | INTRAVENOUS | Status: AC
  Administered 2015-06-08: 420 mg via INTRAVENOUS
  Filled 2015-06-08: qty 20

## 2015-06-08 MED ORDER — SODIUM CHLORIDE 0.9 % IJ SOLN
10.0000 mL | INTRAMUSCULAR | Status: DC | PRN
  Administered 2015-06-08: 10 mL
  Filled 2015-06-08: qty 10

## 2015-06-08 NOTE — Telephone Encounter (Signed)
Gave avs & calendar for September/October. °

## 2015-06-08 NOTE — Patient Instructions (Signed)
[facility] Discharge Instructions for Patients Receiving Chemotherapy  Today you received the following chemotherapy agents:  Herceptin  To help prevent nausea and vomiting after your treatment, we encourage you to take your nausea medication as prescribed.   If you develop nausea and vomiting that is not controlled by your nausea medication, call the clinic.   BELOW ARE SYMPTOMS THAT SHOULD BE REPORTED IMMEDIATELY:  *FEVER GREATER THAN 100.5 F  *CHILLS WITH OR WITHOUT FEVER  NAUSEA AND VOMITING THAT IS NOT CONTROLLED WITH YOUR NAUSEA MEDICATION  *UNUSUAL SHORTNESS OF BREATH  *UNUSUAL BRUISING OR BLEEDING  TENDERNESS IN MOUTH AND THROAT WITH OR WITHOUT PRESENCE OF ULCERS  *URINARY PROBLEMS  *BOWEL PROBLEMS  UNUSUAL RASH Items with * indicate a potential emergency and should be followed up as soon as possible.  Feel free to call the clinic you have any questions or concerns. The clinic phone number is [phone].  Please show the CHEMO ALERT CARD at check-in to the Emergency Department and triage nurse.   

## 2015-06-08 NOTE — Assessment & Plan Note (Signed)
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 No role of antiestrogen therapy because she is ER/PR negative  Monitoring closely for toxicities to Herceptin Every 3 weeks Follow up Q 6 weeks

## 2015-06-08 NOTE — Progress Notes (Signed)
Patient Care Team: Crist Infante, MD as PCP - General (Internal Medicine) Alphonsa Overall, MD as Consulting Physician (General Surgery) Nicholas Lose, MD as Consulting Physician (Hematology and Oncology) Gery Pray, MD as Consulting Physician (Radiation Oncology)  DIAGNOSIS: Breast cancer of upper-inner quadrant of right female breast   Staging form: Breast, AJCC 7th Edition     Clinical: Stage IIA (T2, N0, cM0) - Signed by Rulon Eisenmenger, MD on 08/13/2014     Pathologic stage from 01/14/2015: yT0, N0, cM0 - Signed by Enid Cutter, MD on 01/21/2015       Staging comments: Staged on final lumpectomy specimen by Dr. Lyndon Code.    SUMMARY OF ONCOLOGIC HISTORY:   Breast cancer of upper-inner quadrant of right female breast   07/17/2014 Mammogram Right breast mass at 3:00 position by ultrasound measured 2.4 cm   08/06/2014 Initial Biopsy 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 ratio 2.75, Ki-67 80%   09/04/2014 -  Neo-Adjuvant Chemotherapy Neoadjuvant Taxotere, carboplatin, Herceptin and Perjeta, plan is for 6 cycles followed by Herceptin maintenance for a year; carboplatin discontinued after cycle 2, Taxotere dose reduced with cycle 3   09/06/2014 - 09/11/2014 Hospital Admission Hospitalization for pneumonia   12/25/2014 Breast MRI Post neoadjuvant MRI revealed no residual enhancement or mass in the area of the known right breast cancer   01/13/2015 Surgery Complete Pathologic Response   02/20/2015 - 03/24/2015 Radiation Therapy Adjuvant XRT Dr. Sondra Barges    CHIEF COMPLIANT: follow-up on Herceptin  INTERVAL HISTORY: Kelli Owens is a 74 year old with above-mentioned history of right breast cancer with complete pathological response to any adjuvant therapy. She is currently on maintenance Herceptin after finishing with radiation. She reports no major problems or concerns with Herceptin. Her last echocardiogram in July was normal with an ejection fraction 60-65%. She continues  to remain moderately fatigued. Although she reports that it is not because of lack of energy but it is because of lack of interest in doing anything outside the house.  REVIEW OF SYSTEMS:   Constitutional: Denies fevers, chills or abnormal weight loss Eyes: Denies blurriness of vision Ears, nose, mouth, throat, and face: Denies mucositis or sore throat Respiratory: Denies cough, dyspnea or wheezes Cardiovascular: Denies palpitation, chest discomfort or lower extremity swelling Gastrointestinal:  Denies nausea, heartburn or change in bowel habits Skin: Denies abnormal skin rashes Lymphatics: Denies new lymphadenopathy or easy bruising Neurological:Denies numbness, tingling or new weaknesses Behavioral/Psych: Mood is stable, no new changes  Breast:  denies any pain or lumps or nodules in either breasts All other systems were reviewed with the patient and are negative.  I have reviewed the past medical history, past surgical history, social history and family history with the patient and they are unchanged from previous note.  ALLERGIES:  has No Known Allergies.  MEDICATIONS:  Current Outpatient Prescriptions  Medication Sig Dispense Refill  . acetaminophen (TYLENOL) 325 MG tablet Take 650 mg by mouth every 6 (six) hours as needed.    . cyanocobalamin (,VITAMIN B-12,) 1000 MCG/ML injection Inject 1,000 mcg into the muscle every 30 (thirty) days.    Marland Kitchen emollient (BIAFINE) cream Apply topically as needed.    Marland Kitchen HYDROcodone-acetaminophen (NORCO/VICODIN) 5-325 MG per tablet Take 1-2 tablets by mouth every 6 (six) hours as needed. 30 tablet 0  . lidocaine-prilocaine (EMLA) cream Apply 1 application topically as needed. 30 g 0  . LYRICA 75 MG capsule Take 75 mg by mouth 3 (three) times daily as needed (for pain).  0  . Multiple Vitamin (MULTIVITAMIN WITH MINERALS) TABS tablet Take 1 tablet by mouth daily.    . non-metallic deodorant Jethro Poling) MISC Apply 1 application topically daily as needed.    Marland Kitchen  oxyCODONE-acetaminophen (PERCOCET/ROXICET) 5-325 MG per tablet Take 1 tablet by mouth every 4 (four) hours as needed for moderate pain or severe pain. 15 tablet 0  . pantoprazole (PROTONIX) 40 MG tablet Take 40 mg by mouth daily as needed.    . temazepam (RESTORIL) 30 MG capsule Take 30 mg by mouth at bedtime as needed for sleep.    . Vitamin D, Ergocalciferol, (DRISDOL) 50000 UNITS CAPS Take 50,000 Units by mouth every 7 (seven) days. Takes on Thursday or Friday    . Wound Cleansers (RADIAPLEX EX) Apply topically.     No current facility-administered medications for this visit.   Facility-Administered Medications Ordered in Other Visits  Medication Dose Route Frequency Provider Last Rate Last Dose  . sodium chloride 0.9 % injection 10 mL  10 mL Intracatheter PRN Nicholas Lose, MD   10 mL at 06/08/15 1138    PHYSICAL EXAMINATION: ECOG PERFORMANCE STATUS: 1 - Symptomatic but completely ambulatory  Filed Vitals:   06/08/15 0939  BP: 125/51  Pulse: 56  Temp: 97.3 F (36.3 C)  Resp: 18   Filed Weights   06/08/15 0939  Weight: 156 lb 4.8 oz (70.897 kg)    GENERAL:alert, no distress and comfortable SKIN: skin color, texture, turgor are normal, no rashes or significant lesions EYES: normal, Conjunctiva are pink and non-injected, sclera clear OROPHARYNX:no exudate, no erythema and lips, buccal mucosa, and tongue normal  NECK: supple, thyroid normal size, non-tender, without nodularity LYMPH:  no palpable lymphadenopathy in the cervical, axillary or inguinal LUNGS: clear to auscultation and percussion with normal breathing effort HEART: regular rate & rhythm and no murmurs and no lower extremity edema ABDOMEN:abdomen soft, non-tender and normal bowel sounds Musculoskeletal:no cyanosis of digits and no clubbing  NEURO: alert & oriented x 3 with fluent speech, no focal motor/sensory deficits  LABORATORY DATA:  I have reviewed the data as listed   Chemistry      Component Value  Date/Time   NA 144 06/08/2015 0918   NA 142 01/29/2015 0851   K 4.4 06/08/2015 0918   K 3.7 01/29/2015 0851   CL 108 01/29/2015 0851   CO2 26 06/08/2015 0918   CO2 29 01/29/2015 0851   BUN 21.2 06/08/2015 0918   BUN 20 01/29/2015 0851   CREATININE 1.1 06/08/2015 0918   CREATININE 1.06 01/29/2015 0851      Component Value Date/Time   CALCIUM 9.7 06/08/2015 0918   CALCIUM 9.5 01/29/2015 0851   ALKPHOS 61 06/08/2015 0918   ALKPHOS 72 01/29/2015 0851   AST 18 06/08/2015 0918   AST 20 01/29/2015 0851   ALT 15 06/08/2015 0918   ALT 15 01/29/2015 0851   BILITOT 0.28 06/08/2015 0918   BILITOT 0.2* 01/29/2015 0851       Lab Results  Component Value Date   WBC 3.8* 06/08/2015   HGB 11.1* 06/08/2015   HCT 32.4* 06/08/2015   MCV 93.8 06/08/2015   PLT 201 06/08/2015   NEUTROABS 2.2 06/08/2015    ASSESSMENT & PLAN:  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 No role of antiestrogen therapy because she is ER/PR negative  Monitoring closely for toxicities to Herceptin Every 3 weeks Follow up Q 6 weeks Patient has financial concerns regarding some of the bills that she is receiving. I instructed her to meet with our financial counselors.  No orders of the defined types were placed in this encounter.   The patient has a good understanding of the  overall plan. she agrees with it. she will call with any problems that may develop before the next visit here.   Rulon Eisenmenger, MD

## 2015-06-25 ENCOUNTER — Ambulatory Visit (HOSPITAL_BASED_OUTPATIENT_CLINIC_OR_DEPARTMENT_OTHER): Payer: PPO

## 2015-06-25 VITALS — BP 112/48 | HR 57 | Temp 97.0°F | Resp 16

## 2015-06-25 DIAGNOSIS — C50111 Malignant neoplasm of central portion of right female breast: Secondary | ICD-10-CM | POA: Diagnosis not present

## 2015-06-25 DIAGNOSIS — Z5112 Encounter for antineoplastic immunotherapy: Secondary | ICD-10-CM | POA: Diagnosis not present

## 2015-06-25 DIAGNOSIS — C50211 Malignant neoplasm of upper-inner quadrant of right female breast: Secondary | ICD-10-CM

## 2015-06-25 MED ORDER — TRASTUZUMAB CHEMO INJECTION 440 MG
6.0000 mg/kg | Freq: Once | INTRAVENOUS | Status: AC
  Administered 2015-06-25: 420 mg via INTRAVENOUS
  Filled 2015-06-25: qty 20

## 2015-06-25 MED ORDER — SODIUM CHLORIDE 0.9 % IV SOLN
Freq: Once | INTRAVENOUS | Status: AC
  Administered 2015-06-25: 11:00:00 via INTRAVENOUS

## 2015-06-25 MED ORDER — ACETAMINOPHEN 325 MG PO TABS
ORAL_TABLET | ORAL | Status: AC
  Filled 2015-06-25: qty 2

## 2015-06-25 MED ORDER — SODIUM CHLORIDE 0.9 % IJ SOLN
10.0000 mL | INTRAMUSCULAR | Status: DC | PRN
  Administered 2015-06-25: 10 mL
  Filled 2015-06-25: qty 10

## 2015-06-25 MED ORDER — HEPARIN SOD (PORK) LOCK FLUSH 100 UNIT/ML IV SOLN
500.0000 [IU] | Freq: Once | INTRAVENOUS | Status: AC | PRN
  Administered 2015-06-25: 500 [IU]
  Filled 2015-06-25: qty 5

## 2015-06-25 MED ORDER — ACETAMINOPHEN 325 MG PO TABS
650.0000 mg | ORAL_TABLET | Freq: Once | ORAL | Status: AC
  Administered 2015-06-25: 650 mg via ORAL

## 2015-06-25 MED ORDER — DIPHENHYDRAMINE HCL 25 MG PO CAPS
ORAL_CAPSULE | ORAL | Status: AC
  Filled 2015-06-25: qty 2

## 2015-06-25 MED ORDER — DIPHENHYDRAMINE HCL 25 MG PO CAPS
50.0000 mg | ORAL_CAPSULE | Freq: Once | ORAL | Status: AC
  Administered 2015-06-25: 50 mg via ORAL

## 2015-06-25 NOTE — Progress Notes (Signed)
Pt here for Herceptin.  Spoke with Karna Christmas, collaborative nurse for Dr. Lindi Adie -  MD wants labs to be done every other cycle of Herceptin.

## 2015-06-25 NOTE — Patient Instructions (Signed)
[facility] Discharge Instructions for Patients Receiving Chemotherapy  Today you received the following chemotherapy agents:  Herceptin  To help prevent nausea and vomiting after your treatment, we encourage you to take your nausea medication as prescribed.   If you develop nausea and vomiting that is not controlled by your nausea medication, call the clinic.   BELOW ARE SYMPTOMS THAT SHOULD BE REPORTED IMMEDIATELY:  *FEVER GREATER THAN 100.5 F  *CHILLS WITH OR WITHOUT FEVER  NAUSEA AND VOMITING THAT IS NOT CONTROLLED WITH YOUR NAUSEA MEDICATION  *UNUSUAL SHORTNESS OF BREATH  *UNUSUAL BRUISING OR BLEEDING  TENDERNESS IN MOUTH AND THROAT WITH OR WITHOUT PRESENCE OF ULCERS  *URINARY PROBLEMS  *BOWEL PROBLEMS  UNUSUAL RASH Items with * indicate a potential emergency and should be followed up as soon as possible.  Feel free to call the clinic you have any questions or concerns. The clinic phone number is [phone].  Please show the CHEMO ALERT CARD at check-in to the Emergency Department and triage nurse.   

## 2015-07-16 ENCOUNTER — Other Ambulatory Visit (HOSPITAL_BASED_OUTPATIENT_CLINIC_OR_DEPARTMENT_OTHER): Payer: PPO

## 2015-07-16 ENCOUNTER — Ambulatory Visit (HOSPITAL_BASED_OUTPATIENT_CLINIC_OR_DEPARTMENT_OTHER): Payer: PPO | Admitting: Hematology and Oncology

## 2015-07-16 ENCOUNTER — Telehealth: Payer: Self-pay | Admitting: Hematology and Oncology

## 2015-07-16 ENCOUNTER — Ambulatory Visit (HOSPITAL_BASED_OUTPATIENT_CLINIC_OR_DEPARTMENT_OTHER): Payer: PPO

## 2015-07-16 ENCOUNTER — Encounter: Payer: Self-pay | Admitting: Hematology and Oncology

## 2015-07-16 VITALS — BP 122/51 | HR 57 | Temp 97.5°F | Resp 18 | Ht 61.0 in | Wt 152.7 lb

## 2015-07-16 DIAGNOSIS — T451X5A Adverse effect of antineoplastic and immunosuppressive drugs, initial encounter: Secondary | ICD-10-CM

## 2015-07-16 DIAGNOSIS — C50111 Malignant neoplasm of central portion of right female breast: Secondary | ICD-10-CM

## 2015-07-16 DIAGNOSIS — C50211 Malignant neoplasm of upper-inner quadrant of right female breast: Secondary | ICD-10-CM

## 2015-07-16 DIAGNOSIS — Z5112 Encounter for antineoplastic immunotherapy: Secondary | ICD-10-CM

## 2015-07-16 DIAGNOSIS — D6481 Anemia due to antineoplastic chemotherapy: Secondary | ICD-10-CM

## 2015-07-16 LAB — COMPREHENSIVE METABOLIC PANEL (CC13)
ALBUMIN: 3.5 g/dL (ref 3.5–5.0)
ALT: 14 U/L (ref 0–55)
AST: 18 U/L (ref 5–34)
Alkaline Phosphatase: 59 U/L (ref 40–150)
Anion Gap: 6 mEq/L (ref 3–11)
BILIRUBIN TOTAL: 0.37 mg/dL (ref 0.20–1.20)
BUN: 24.5 mg/dL (ref 7.0–26.0)
CALCIUM: 9.5 mg/dL (ref 8.4–10.4)
CHLORIDE: 110 meq/L — AB (ref 98–109)
CO2: 26 mEq/L (ref 22–29)
CREATININE: 1.1 mg/dL (ref 0.6–1.1)
EGFR: 49 mL/min/{1.73_m2} — ABNORMAL LOW (ref >90)
Glucose: 98 mg/dl (ref 70–140)
Potassium: 4.4 mEq/L (ref 3.5–5.1)
Sodium: 141 mEq/L (ref 136–145)
TOTAL PROTEIN: 6.1 g/dL — AB (ref 6.4–8.3)

## 2015-07-16 LAB — CBC WITH DIFFERENTIAL/PLATELET
BASO%: 0.4 % (ref 0.0–2.0)
Basophils Absolute: 0 10*3/uL (ref 0.0–0.1)
EOS%: 2.4 % (ref 0.0–7.0)
Eosinophils Absolute: 0.1 10*3/uL (ref 0.0–0.5)
HEMATOCRIT: 31.9 % — AB (ref 34.8–46.6)
HEMOGLOBIN: 10.9 g/dL — AB (ref 11.6–15.9)
LYMPH#: 1.2 10*3/uL (ref 0.9–3.3)
LYMPH%: 32 % (ref 14.0–49.7)
MCH: 31.6 pg (ref 25.1–34.0)
MCHC: 34 g/dL (ref 31.5–36.0)
MCV: 92.9 fL (ref 79.5–101.0)
MONO#: 0.4 10*3/uL (ref 0.1–0.9)
MONO%: 10.2 % (ref 0.0–14.0)
NEUT%: 55 % (ref 38.4–76.8)
NEUTROS ABS: 2 10*3/uL (ref 1.5–6.5)
PLATELETS: 195 10*3/uL (ref 145–400)
RBC: 3.44 10*6/uL — ABNORMAL LOW (ref 3.70–5.45)
RDW: 12.5 % (ref 11.2–14.5)
WBC: 3.7 10*3/uL — AB (ref 3.9–10.3)

## 2015-07-16 MED ORDER — SODIUM CHLORIDE 0.9 % IV SOLN
Freq: Once | INTRAVENOUS | Status: AC
  Administered 2015-07-16: 11:00:00 via INTRAVENOUS

## 2015-07-16 MED ORDER — SODIUM CHLORIDE 0.9 % IJ SOLN
10.0000 mL | INTRAMUSCULAR | Status: DC | PRN
  Administered 2015-07-16: 10 mL
  Filled 2015-07-16: qty 10

## 2015-07-16 MED ORDER — HEPARIN SOD (PORK) LOCK FLUSH 100 UNIT/ML IV SOLN
500.0000 [IU] | Freq: Once | INTRAVENOUS | Status: AC | PRN
  Administered 2015-07-16: 500 [IU]
  Filled 2015-07-16: qty 5

## 2015-07-16 MED ORDER — DIPHENHYDRAMINE HCL 25 MG PO CAPS
50.0000 mg | ORAL_CAPSULE | Freq: Once | ORAL | Status: AC
  Administered 2015-07-16: 50 mg via ORAL

## 2015-07-16 MED ORDER — TRASTUZUMAB CHEMO INJECTION 440 MG
6.0000 mg/kg | Freq: Once | INTRAVENOUS | Status: AC
  Administered 2015-07-16: 420 mg via INTRAVENOUS
  Filled 2015-07-16: qty 20

## 2015-07-16 MED ORDER — DIPHENHYDRAMINE HCL 25 MG PO CAPS
ORAL_CAPSULE | ORAL | Status: AC
  Filled 2015-07-16: qty 2

## 2015-07-16 MED ORDER — ACETAMINOPHEN 325 MG PO TABS
650.0000 mg | ORAL_TABLET | Freq: Once | ORAL | Status: AC
  Administered 2015-07-16: 650 mg via ORAL

## 2015-07-16 MED ORDER — ACETAMINOPHEN 325 MG PO TABS
ORAL_TABLET | ORAL | Status: AC
  Filled 2015-07-16: qty 2

## 2015-07-16 NOTE — Assessment & Plan Note (Signed)
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 Maintenance Herceptin which will be completed November 2016 2. No role of antiestrogen therapy because she is ER/PR negative  Monitoring closely for toxicities to Herceptin Every 3 weeks Follow up Q 6 weeks

## 2015-07-16 NOTE — Progress Notes (Signed)
Patient Care Team: Crist Infante, MD as PCP - General (Internal Medicine) Alphonsa Overall, MD as Consulting Physician (General Surgery) Nicholas Lose, MD as Consulting Physician (Hematology and Oncology) Gery Pray, MD as Consulting Physician (Radiation Oncology)  DIAGNOSIS: Breast cancer of upper-inner quadrant of right female breast   Staging form: Breast, AJCC 7th Edition     Clinical: Stage IIA (T2, N0, cM0) - Signed by Rulon Eisenmenger, MD on 08/13/2014     Pathologic stage from 01/14/2015: yT0, N0, cM0 - Signed by Enid Cutter, MD on 01/21/2015       Staging comments: Staged on final lumpectomy specimen by Dr. Lyndon Code.    SUMMARY OF ONCOLOGIC HISTORY:   Breast cancer of upper-inner quadrant of right female breast   07/17/2014 Mammogram Right breast mass at 3:00 position by ultrasound measured 2.4 cm   08/06/2014 Initial Biopsy 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 ratio 2.75, Ki-67 80%   09/04/2014 -  Neo-Adjuvant Chemotherapy Neoadjuvant Taxotere, carboplatin, Herceptin and Perjeta, plan is for 6 cycles followed by Herceptin maintenance for a year; carboplatin discontinued after cycle 2, Taxotere dose reduced with cycle 3   09/06/2014 - 09/11/2014 Hospital Admission Hospitalization for pneumonia   12/25/2014 Breast MRI Post neoadjuvant MRI revealed no residual enhancement or mass in the area of the known right breast cancer   01/13/2015 Surgery Complete Pathologic Response   02/20/2015 - 03/24/2015 Radiation Therapy Adjuvant XRT Dr. Sondra Barges    CHIEF COMPLIANT: follow-up on maintenance Herceptin  INTERVAL HISTORY: Kelli Owens is a 74 year old with above-mentioned history of right breast cancer treated with neo-adjuvant chemotherapy and had a pathologic complete response. She is currently on maintenance Herceptin and tolerating it very well. She is getting scheduled for another mammogram. She is expected to finish Herceptin maintenance in 6 weeks.  REVIEW OF  SYSTEMS:   Constitutional: Denies fevers, chills or abnormal weight loss Eyes: Denies blurriness of vision Ears, nose, mouth, throat, and face: Denies mucositis or sore throat Respiratory: Denies cough, dyspnea or wheezes Cardiovascular: Denies palpitation, chest discomfort or lower extremity swelling Gastrointestinal:  Denies nausea, heartburn or change in bowel habits Skin: Denies abnormal skin rashes Lymphatics: Denies new lymphadenopathy or easy bruising Neurological:Denies numbness, tingling or new weaknesses Behavioral/Psych: Mood is stable, no new changes  Breast:  denies any pain or lumps or nodules in either breasts All other systems were reviewed with the patient and are negative.  I have reviewed the past medical history, past surgical history, social history and family history with the patient and they are unchanged from previous note.  ALLERGIES:  has No Known Allergies.  MEDICATIONS:  Current Outpatient Prescriptions  Medication Sig Dispense Refill  . acetaminophen (TYLENOL) 325 MG tablet Take 650 mg by mouth every 6 (six) hours as needed.    . cyanocobalamin (,VITAMIN B-12,) 1000 MCG/ML injection Inject 1,000 mcg into the muscle every 30 (thirty) days.    Marland Kitchen emollient (BIAFINE) cream Apply topically as needed.    Marland Kitchen HYDROcodone-acetaminophen (NORCO/VICODIN) 5-325 MG per tablet Take 1-2 tablets by mouth every 6 (six) hours as needed. 30 tablet 0  . lidocaine-prilocaine (EMLA) cream Apply 1 application topically as needed. 30 g 0  . LYRICA 75 MG capsule Take 75 mg by mouth 3 (three) times daily as needed (for pain).   0  . Multiple Vitamin (MULTIVITAMIN WITH MINERALS) TABS tablet Take 1 tablet by mouth daily.    . non-metallic deodorant Jethro Poling) MISC Apply 1 application topically daily as  needed.    Marland Kitchen oxyCODONE-acetaminophen (PERCOCET/ROXICET) 5-325 MG per tablet Take 1 tablet by mouth every 4 (four) hours as needed for moderate pain or severe pain. 15 tablet 0  . pantoprazole  (PROTONIX) 40 MG tablet Take 40 mg by mouth daily as needed.    . temazepam (RESTORIL) 30 MG capsule Take 30 mg by mouth at bedtime as needed for sleep.    . Vitamin D, Ergocalciferol, (DRISDOL) 50000 UNITS CAPS Take 50,000 Units by mouth every 7 (seven) days. Takes on Thursday or Friday    . Wound Cleansers (RADIAPLEX EX) Apply topically.     No current facility-administered medications for this visit.    PHYSICAL EXAMINATION: ECOG PERFORMANCE STATUS: 1 - Symptomatic but completely ambulatory  Filed Vitals:   07/16/15 1005  BP: 122/51  Pulse: 57  Temp: 97.5 F (36.4 C)  Resp: 18   Filed Weights   07/16/15 1005  Weight: 152 lb 11.2 oz (69.264 kg)    GENERAL:alert, no distress and comfortable SKIN: skin color, texture, turgor are normal, no rashes or significant lesions EYES: normal, Conjunctiva are pink and non-injected, sclera clear OROPHARYNX:no exudate, no erythema and lips, buccal mucosa, and tongue normal  NECK: supple, thyroid normal size, non-tender, without nodularity LYMPH:  no palpable lymphadenopathy in the cervical, axillary or inguinal LUNGS: clear to auscultation and percussion with normal breathing effort HEART: regular rate & rhythm and no murmurs and no lower extremity edema ABDOMEN:abdomen soft, non-tender and normal bowel sounds Musculoskeletal:no cyanosis of digits and no clubbing  NEURO: alert & oriented x 3 with fluent speech, no focal motor/sensory deficits  LABORATORY DATA:  I have reviewed the data as listed   Chemistry      Component Value Date/Time   NA 141 07/16/2015 0923   NA 142 01/29/2015 0851   K 4.4 07/16/2015 0923   K 3.7 01/29/2015 0851   CL 108 01/29/2015 0851   CO2 26 07/16/2015 0923   CO2 29 01/29/2015 0851   BUN 24.5 07/16/2015 0923   BUN 20 01/29/2015 0851   CREATININE 1.1 07/16/2015 0923   CREATININE 1.06 01/29/2015 0851      Component Value Date/Time   CALCIUM 9.5 07/16/2015 0923   CALCIUM 9.5 01/29/2015 0851   ALKPHOS  59 07/16/2015 0923   ALKPHOS 72 01/29/2015 0851   AST 18 07/16/2015 0923   AST 20 01/29/2015 0851   ALT 14 07/16/2015 0923   ALT 15 01/29/2015 0851   BILITOT 0.37 07/16/2015 0923   BILITOT 0.2* 01/29/2015 0851       Lab Results  Component Value Date   WBC 3.7* 07/16/2015   HGB 10.9* 07/16/2015   HCT 31.9* 07/16/2015   MCV 92.9 07/16/2015   PLT 195 07/16/2015   NEUTROABS 2.0 07/16/2015   ASSESSMENT & PLAN:  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 Maintenance Herceptin which will be completed November 2016 2. No role of antiestrogen therapy because she is ER/PR negative  Monitoring closely for toxicities  to Herceptin Every 3 weeks Follow up Q 6 weeks    Orders Placed This Encounter  Procedures  . ECHOCARDIOGRAM COMPLETE    On herceptin evaluation    Standing Status: Future     Number of Occurrences:      Standing Expiration Date: 10/14/2016    Order Specific Question:  Where should this test be performed    Answer:  [facility]    Order Specific Question:  Complete or Limited study?    Answer:  Limited    Order Specific Question:  With Image Enhancing Agent or without Image Enhancing Agent?    Answer:  With Image Enhancing Agent    Order Specific Question:  Reason for exam-Echo    Answer:  Chemo  V67.2 / Z09   The patient has a good understanding of the overall plan. she agrees with it. she will  call with any problems that may develop before the next visit here.   Rulon Eisenmenger, MD

## 2015-07-16 NOTE — Patient Instructions (Signed)
[facility] Discharge Instructions for Patients Receiving Chemotherapy  Today you received the following chemotherapy agents:  Herceptin  To help prevent nausea and vomiting after your treatment, we encourage you to take your nausea medication as prescribed.   If you develop nausea and vomiting that is not controlled by your nausea medication, call the clinic.   BELOW ARE SYMPTOMS THAT SHOULD BE REPORTED IMMEDIATELY:  *FEVER GREATER THAN 100.5 F  *CHILLS WITH OR WITHOUT FEVER  NAUSEA AND VOMITING THAT IS NOT CONTROLLED WITH YOUR NAUSEA MEDICATION  *UNUSUAL SHORTNESS OF BREATH  *UNUSUAL BRUISING OR BLEEDING  TENDERNESS IN MOUTH AND THROAT WITH OR WITHOUT PRESENCE OF ULCERS  *URINARY PROBLEMS  *BOWEL PROBLEMS  UNUSUAL RASH Items with * indicate a potential emergency and should be followed up as soon as possible.  Feel free to call the clinic you have any questions or concerns. The clinic phone number is [phone].  Please show the CHEMO ALERT CARD at check-in to the Emergency Department and triage nurse.   

## 2015-07-16 NOTE — Telephone Encounter (Signed)
Appointments made and avs printed for patient °

## 2015-07-17 ENCOUNTER — Telehealth: Payer: Self-pay | Admitting: Hematology and Oncology

## 2015-07-17 NOTE — Telephone Encounter (Signed)
Called and left a message with echo appointment °

## 2015-07-20 ENCOUNTER — Ambulatory Visit
Admission: RE | Admit: 2015-07-20 | Discharge: 2015-07-20 | Disposition: A | Discharge status: Home / Self Care | Payer: PPO | Source: Ambulatory Visit | Attending: Hematology and Oncology | Admitting: Hematology and Oncology

## 2015-07-20 DIAGNOSIS — C50911 Malignant neoplasm of unspecified site of right female breast: Secondary | ICD-10-CM

## 2015-07-20 DIAGNOSIS — Z9889 Other specified postprocedural states: Secondary | ICD-10-CM

## 2015-07-20 DIAGNOSIS — N644 Mastodynia: Secondary | ICD-10-CM

## 2015-07-23 ENCOUNTER — Ambulatory Visit: Payer: PPO

## 2015-07-23 ENCOUNTER — Other Ambulatory Visit: Payer: PPO

## 2015-07-23 ENCOUNTER — Ambulatory Visit: Payer: PPO | Admitting: Hematology and Oncology

## 2015-07-28 ENCOUNTER — Ambulatory Visit (HOSPITAL_COMMUNITY)
Admission: RE | Admit: 2015-07-28 | Discharge: 2015-07-28 | Disposition: A | Discharge status: Home / Self Care | Payer: PPO | Source: Ambulatory Visit | Attending: Hematology and Oncology | Admitting: Hematology and Oncology

## 2015-07-28 DIAGNOSIS — C50211 Malignant neoplasm of upper-inner quadrant of right female breast: Secondary | ICD-10-CM

## 2015-07-28 DIAGNOSIS — Z5111 Encounter for antineoplastic chemotherapy: Secondary | ICD-10-CM

## 2015-07-28 NOTE — Progress Notes (Signed)
Echocardiogram 2D Echocardiogram has been performed.  Kelli Owens 07/28/2015, 11:23 AM

## 2015-08-06 ENCOUNTER — Other Ambulatory Visit (HOSPITAL_BASED_OUTPATIENT_CLINIC_OR_DEPARTMENT_OTHER): Payer: PPO

## 2015-08-06 ENCOUNTER — Ambulatory Visit (HOSPITAL_BASED_OUTPATIENT_CLINIC_OR_DEPARTMENT_OTHER): Payer: PPO | Admitting: Nurse Practitioner

## 2015-08-06 ENCOUNTER — Ambulatory Visit (HOSPITAL_BASED_OUTPATIENT_CLINIC_OR_DEPARTMENT_OTHER): Payer: PPO

## 2015-08-06 VITALS — BP 133/59 | HR 60 | Temp 98.2°F

## 2015-08-06 DIAGNOSIS — C50111 Malignant neoplasm of central portion of right female breast: Secondary | ICD-10-CM | POA: Diagnosis not present

## 2015-08-06 DIAGNOSIS — Z5112 Encounter for antineoplastic immunotherapy: Secondary | ICD-10-CM | POA: Diagnosis not present

## 2015-08-06 DIAGNOSIS — C50211 Malignant neoplasm of upper-inner quadrant of right female breast: Secondary | ICD-10-CM

## 2015-08-06 DIAGNOSIS — R21 Rash and other nonspecific skin eruption: Secondary | ICD-10-CM

## 2015-08-06 LAB — CBC WITH DIFFERENTIAL/PLATELET
BASO%: 0.5 % (ref 0.0–2.0)
Basophils Absolute: 0 10*3/uL (ref 0.0–0.1)
EOS%: 2.9 % (ref 0.0–7.0)
Eosinophils Absolute: 0.1 10*3/uL (ref 0.0–0.5)
HEMATOCRIT: 32.4 % — AB (ref 34.8–46.6)
HEMOGLOBIN: 11 g/dL — AB (ref 11.6–15.9)
LYMPH#: 1.3 10*3/uL (ref 0.9–3.3)
LYMPH%: 30 % (ref 14.0–49.7)
MCH: 31.7 pg (ref 25.1–34.0)
MCHC: 33.9 g/dL (ref 31.5–36.0)
MCV: 93.4 fL (ref 79.5–101.0)
MONO#: 0.5 10*3/uL (ref 0.1–0.9)
MONO%: 12.6 % (ref 0.0–14.0)
NEUT#: 2.3 10*3/uL (ref 1.5–6.5)
NEUT%: 54 % (ref 38.4–76.8)
PLATELETS: 193 10*3/uL (ref 145–400)
RBC: 3.47 10*6/uL — ABNORMAL LOW (ref 3.70–5.45)
RDW: 12.8 % (ref 11.2–14.5)
WBC: 4.3 10*3/uL (ref 3.9–10.3)

## 2015-08-06 LAB — COMPREHENSIVE METABOLIC PANEL (CC13)
ALBUMIN: 3.5 g/dL (ref 3.5–5.0)
ALK PHOS: 65 U/L (ref 40–150)
ALT: 19 U/L (ref 0–55)
AST: 22 U/L (ref 5–34)
Anion Gap: 5 mEq/L (ref 3–11)
BUN: 23.3 mg/dL (ref 7.0–26.0)
CALCIUM: 9.9 mg/dL (ref 8.4–10.4)
CO2: 27 mEq/L (ref 22–29)
Chloride: 110 mEq/L — ABNORMAL HIGH (ref 98–109)
Creatinine: 1.1 mg/dL (ref 0.6–1.1)
EGFR: 51 mL/min/{1.73_m2} — AB (ref >90)
Glucose: 86 mg/dl (ref 70–140)
POTASSIUM: 4.4 meq/L (ref 3.5–5.1)
Sodium: 142 mEq/L (ref 136–145)
Total Bilirubin: <0.3 mg/dL (ref 0.20–1.20)
Total Protein: 6.4 g/dL (ref 6.4–8.3)

## 2015-08-06 MED ORDER — HEPARIN SOD (PORK) LOCK FLUSH 100 UNIT/ML IV SOLN
500.0000 [IU] | Freq: Once | INTRAVENOUS | Status: AC | PRN
  Administered 2015-08-06: 500 [IU]
  Filled 2015-08-06: qty 5

## 2015-08-06 MED ORDER — DIPHENHYDRAMINE HCL 25 MG PO CAPS
50.0000 mg | ORAL_CAPSULE | Freq: Once | ORAL | Status: AC
  Administered 2015-08-06: 50 mg via ORAL

## 2015-08-06 MED ORDER — DIPHENHYDRAMINE HCL 25 MG PO CAPS
ORAL_CAPSULE | ORAL | Status: AC
  Filled 2015-08-06: qty 1

## 2015-08-06 MED ORDER — ACETAMINOPHEN 325 MG PO TABS
650.0000 mg | ORAL_TABLET | Freq: Once | ORAL | Status: AC
  Administered 2015-08-06: 650 mg via ORAL

## 2015-08-06 MED ORDER — ACETAMINOPHEN 325 MG PO TABS
ORAL_TABLET | ORAL | Status: AC
  Filled 2015-08-06: qty 2

## 2015-08-06 MED ORDER — SODIUM CHLORIDE 0.9 % IJ SOLN
10.0000 mL | INTRAMUSCULAR | Status: DC | PRN
  Administered 2015-08-06: 10 mL
  Filled 2015-08-06: qty 10

## 2015-08-06 MED ORDER — TRASTUZUMAB CHEMO INJECTION 440 MG
6.0000 mg/kg | Freq: Once | INTRAVENOUS | Status: AC
  Administered 2015-08-06: 420 mg via INTRAVENOUS
  Filled 2015-08-06: qty 20

## 2015-08-06 MED ORDER — SODIUM CHLORIDE 0.9 % IV SOLN
Freq: Once | INTRAVENOUS | Status: AC
  Administered 2015-08-06: 10:00:00 via INTRAVENOUS

## 2015-08-06 NOTE — Patient Instructions (Signed)
[facility] Discharge Instructions for Patients Receiving Chemotherapy  Today you received the following chemotherapy agents :  herceptin  To help prevent nausea and vomiting after your treatment, we encourage you to take your nausea medication as prescribed.   If you develop nausea and vomiting that is not controlled by your nausea medication, call the clinic.   BELOW ARE SYMPTOMS THAT SHOULD BE REPORTED IMMEDIATELY:  *FEVER GREATER THAN 100.5 F  *CHILLS WITH OR WITHOUT FEVER  NAUSEA AND VOMITING THAT IS NOT CONTROLLED WITH YOUR NAUSEA MEDICATION  *UNUSUAL SHORTNESS OF BREATH  *UNUSUAL BRUISING OR BLEEDING  TENDERNESS IN MOUTH AND THROAT WITH OR WITHOUT PRESENCE OF ULCERS  *URINARY PROBLEMS  *BOWEL PROBLEMS  UNUSUAL RASH Items with * indicate a potential emergency and should be followed up as soon as possible.  Feel free to call the clinic you have any questions or concerns. The clinic phone number is [phone].  Please show the CHEMO ALERT CARD at check-in to the Emergency Department and triage nurse.   

## 2015-08-07 ENCOUNTER — Encounter: Payer: Self-pay | Admitting: Nurse Practitioner

## 2015-08-07 DIAGNOSIS — R21 Rash and other nonspecific skin eruption: Secondary | ICD-10-CM | POA: Insufficient documentation

## 2015-08-07 NOTE — Assessment & Plan Note (Signed)
Patient reports development of mild, scattered rash to her bilateral legs within the past few days.  She states that the rash was initially some blisters; but they have now opened and dried up.  She does continue to have some pruritus; but feels it is manageable with hydrocortisone cream.  On exam.-Patient does have some mild, scattered healing lesions to her bilateral lower legs.  There is no evidence of infection.  Patient was advised to try Benadryl 25 mg every 6 hours and Pepcid 20 mg every 12 hours to see if this helps with the rash.  Also, she can continue to use the hydrocortisone cream.  Since patient has been receiving the Herceptin for almost a full year-very unlikely that the Herceptin is causing the rash.  Advised patient to call/return or follow-up with her primary care provider if rash continues or worsens.

## 2015-08-07 NOTE — Progress Notes (Signed)
SYMPTOM MANAGEMENT CLINIC   HPI: Kelli Owens 74 y.o. female diagnosed with breast cancer.  Currently undergoing Herceptin therapy.  Patient reports development of mild, scattered rash to her bilateral legs within the past few days.  She states that the rash was initially some blisters; but they have now opened and dried up.  She does continue to have some pruritus; but feels it is manageable with hydrocortisone cream.  On exam.-Patient does have some mild, scattered healing lesions to her bilateral lower legs.  There is no evidence of infection.  Patient was advised to try Benadryl 25 mg every 6 hours and Pepcid 20 mg every 12 hours to see if this helps with the rash.  Also, she can continue to use the hydrocortisone cream.  Since patient has been receiving the Herceptin for almost a full year-very unlikely that the Herceptin is causing the rash.  Advised patient to call/return or follow-up with her primary care provider if rash continues or worsens.  HPI  ROS  Past Medical History  Diagnosis Date  . Depression   . Anxiety   . GERD (gastroesophageal reflux disease)   . Anemia   . Dyslipidemia   . DDD (degenerative disc disease), lumbar   . Osteopenia   . 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 HSV infection   . Vitamin D deficiency   . Whooping cough   . Barrett's esophagus   . Breast cancer of upper-inner quadrant of right female breast ([place]) 08/08/2014  . Fibromyalgia   . Radiation 02/23/15-03/24/15    right breast    Past Surgical History  Procedure Laterality Date  . Appendectomy    . Total abdominal hysterectomy    . Bilateral salpingoophorectomy    . Portacath placement Left 08/26/2014    Procedure: INSERTION PORT-A-CATH;  Surgeon: Alphonsa Overall, MD;  Location: [facility];  Service: General;  Laterality: Left;  . Colonoscopy    . Breast lumpectomy with needle localization and axillary sentinel lymph node bx Right 01/13/15    RIGHT BREAST LUMPECTOMY WITH RADIOACTIVE SEED  AND RIGHT AXILLARY SENTINEL LYMPH NODE BIOPSY    has Breast cancer of upper-inner quadrant of right female breast ([place]); HCAP (healthcare-associated pneumonia); Leukocytosis; Lung nodule, solitary; Fever; Thrombocytopenia ([place]); Anemia due to chemotherapy; and Rash on her problem list.    has No Known Allergies.    Medication List       This list is accurate as of: 08/06/15 11:59 PM.  Always use your most recent med list.               acetaminophen 325 MG tablet  Commonly known as:  TYLENOL  Take 650 mg by mouth every 6 (six) hours as needed.     cyanocobalamin 1000 MCG/ML injection  Commonly known as:  (VITAMIN B-12)  Inject 1,000 mcg into the muscle every 30 (thirty) days.     emollient cream  Commonly known as:  BIAFINE  Apply topically as needed.     HYDROcodone-acetaminophen 5-325 MG tablet  Commonly known as:  NORCO/VICODIN  Take 1-2 tablets by mouth every 6 (six) hours as needed.     lidocaine-prilocaine cream  Commonly known as:  EMLA  Apply 1 application topically as needed.     LYRICA 75 MG capsule  Generic drug:  pregabalin  Take 75 mg by mouth 3 (three) times daily as needed (for pain).     multivitamin with minerals Tabs tablet  Take 1 tablet by mouth daily.  non-metallic deodorant Misc  Commonly known as:  ALRA  Apply 1 application topically daily as needed.     oxyCODONE-acetaminophen 5-325 MG tablet  Commonly known as:  PERCOCET/ROXICET  Take 1 tablet by mouth every 4 (four) hours as needed for moderate pain or severe pain.     pantoprazole 40 MG tablet  Commonly known as:  PROTONIX  Take 40 mg by mouth daily as needed.     RADIAPLEX EX  Apply topically.     temazepam 30 MG capsule  Commonly known as:  RESTORIL  Take 30 mg by mouth at bedtime as needed for sleep.     Vitamin D (Ergocalciferol) 50000 UNITS Caps capsule  Commonly known as:  DRISDOL  Take 50,000 Units by mouth every 7 (seven) days. Takes on Thursday or Friday           PHYSICAL EXAMINATION  Oncology Vitals 08/06/2015 07/16/2015 06/25/2015 06/08/2015 05/14/2015 04/29/2015 04/23/2015  Height - 155 cm - 155 cm - 155 cm 155 cm  Weight - 69.264 kg - 70.897 kg - 69.673 kg 70.081 kg  Weight (lbs) - 152 lbs 11 oz - 156 lbs 5 oz - 153 lbs 10 oz 154 lbs 8 oz  BMI (kg/m2) - 28.85 kg/m2 - 29.53 kg/m2 - 29.02 kg/m2 29.19 kg/m2  Temp 98.2 97.5 97 97.3 97 97.7 98.2  Pulse 60 57 57 56 59 67 62  Resp - $Remo'18 16 18 18 16 18  'GXLoq$ SpO2 - 100 100 100 100 100 100  BSA (m2) - 1.73 m2 - 1.75 m2 - 1.73 m2 1.74 m2   BP Readings from Last 3 Encounters:  08/06/15 133/59  07/16/15 122/51  06/25/15 112/48    Physical Exam  Constitutional: She is oriented to person, place, and time and well-developed, well-nourished, and in no distress.  HENT:  Head: Normocephalic and atraumatic.  Eyes: Conjunctivae and EOM are normal. Pupils are equal, round, and reactive to light. Right eye exhibits no discharge. Left eye exhibits no discharge. No scleral icterus.  Neck: Normal range of motion.  Pulmonary/Chest: Effort normal. No respiratory distress.  Musculoskeletal: Normal range of motion. She exhibits no edema or tenderness.  Neurological: She is alert and oriented to person, place, and time.  Skin: Skin is warm and dry. Rash noted. No erythema. No pallor.  Scattered, mild healing lesions to bilateral lower extremities.  No evidence of infection.  Psychiatric: Affect normal.  Nursing note and vitals reviewed.   LABORATORY DATA:. Appointment on 08/06/2015  Component Date Value Ref Range Status  . WBC 08/06/2015 4.3  3.9 - 10.3 10e3/uL Final  . NEUT# 08/06/2015 2.3  1.5 - 6.5 10e3/uL Final  . HGB 08/06/2015 11.0* 11.6 - 15.9 g/dL Final  . HCT 08/06/2015 32.4* 34.8 - 46.6 % Final  . Platelets 08/06/2015 193  145 - 400 10e3/uL Final  . MCV 08/06/2015 93.4  79.5 - 101.0 fL Final  . MCH 08/06/2015 31.7  25.1 - 34.0 pg Final  . MCHC 08/06/2015 33.9  31.5 - 36.0 g/dL Final  . RBC 08/06/2015 3.47*  3.70 - 5.45 10e6/uL Final  . RDW 08/06/2015 12.8  11.2 - 14.5 % Final  . lymph# 08/06/2015 1.3  0.9 - 3.3 10e3/uL Final  . MONO# 08/06/2015 0.5  0.1 - 0.9 10e3/uL Final  . Eosinophils Absolute 08/06/2015 0.1  0.0 - 0.5 10e3/uL Final  . Basophils Absolute 08/06/2015 0.0  0.0 - 0.1 10e3/uL Final  . NEUT% 08/06/2015 54.0  38.4 - 76.8 % Final  .  LYMPH% 08/06/2015 30.0  14.0 - 49.7 % Final  . MONO% 08/06/2015 12.6  0.0 - 14.0 % Final  . EOS% 08/06/2015 2.9  0.0 - 7.0 % Final  . BASO% 08/06/2015 0.5  0.0 - 2.0 % Final  . Sodium 08/06/2015 142  136 - 145 mEq/L Final  . Potassium 08/06/2015 4.4  3.5 - 5.1 mEq/L Final  . Chloride 08/06/2015 110* 98 - 109 mEq/L Final  . CO2 08/06/2015 27  22 - 29 mEq/L Final  . Glucose 08/06/2015 86  70 - 140 mg/dl Final   Glucose reference range is for nonfasting patients. Fasting glucose reference range is 70- 100.  Marland Kitchen BUN 08/06/2015 23.3  7.0 - 26.0 mg/dL Final  . Creatinine 40/25/9270 1.1  0.6 - 1.1 mg/dL Final  . Total Bilirubin 08/06/2015 <0.30  0.20 - 1.20 mg/dL Final  . Alkaline Phosphatase 08/06/2015 65  40 - 150 U/L Final  . AST 08/06/2015 22  5 - 34 U/L Final  . ALT 08/06/2015 19  0 - 55 U/L Final  . Total Protein 08/06/2015 6.4  6.4 - 8.3 g/dL Final  . Albumin 17/84/3332 3.5  3.5 - 5.0 g/dL Final  . Calcium 74/57/9396 9.9  8.4 - 10.4 mg/dL Final  . Anion Gap 50/52/5798 5  3 - 11 mEq/L Final  . EGFR 08/06/2015 51* >90 ml/min/1.73 m2 Final   eGFR is calculated using the CKD-EPI Creatinine Equation (2009)   Left leg:       RADIOGRAPHIC STUDIES: No results found.  ASSESSMENT/PLAN:    Breast cancer of upper-inner quadrant of right female breast [facility]) Patient presented to the cancer Center today to receive her Herceptin infusion.  She continues to tolerate Herceptin with minimal side effects.  She will actually be done with all of her Herceptin infusions in November 2016.  Blood counts obtained today reveal a WBC of 4.3, ANC 2.3, hemoglobin  11.0, and platelet count 193.  Patient is scheduled to return on 08/27/2015 for labs, visit, and Herceptin.  Rash Patient reports development of mild, scattered rash to her bilateral legs within the past few days.  She states that the rash was initially some blisters; but they have now opened and dried up.  She does continue to have some pruritus; but feels it is manageable with hydrocortisone cream.  On exam.-Patient does have some mild, scattered healing lesions to her bilateral lower legs.  There is no evidence of infection.  Patient was advised to try Benadryl 25 mg every 6 hours and Pepcid 20 mg every 12 hours to see if this helps with the rash.  Also, she can continue to use the hydrocortisone cream.  Since patient has been receiving the Herceptin for almost a full year-very unlikely that the Herceptin is causing the rash.  Advised patient to call/return or follow-up with her primary care provider if rash continues or worsens.  Patient stated understanding of all instructions; and was in agreement with this plan of care. The patient knows to call the clinic with any problems, questions or concerns.   Review/collaboration with Dr. Pamelia Hoit regarding all aspects of patient's visit today.   Total time spent with patient was 15 minutes;  with greater than 75 percent of that time spent in face to face counseling regarding patient's symptoms,  and coordination of care and follow up.  Disclaimer:This dictation was prepared with Dragon/digital dictation along with [employer]. Any transcriptional errors that result from this process are unintentional.  Payton Mccallum, NP 08/07/2015

## 2015-08-07 NOTE — Assessment & Plan Note (Signed)
Patient presented to the [facility] today to receive her Herceptin infusion.  She continues to tolerate Herceptin with minimal side effects.  She will actually be done with all of her Herceptin infusions in November 2016.  Blood counts obtained today reveal a WBC of 4.3, ANC 2.3, hemoglobin 11.0, and platelet count 193.  Patient is scheduled to return on 08/27/2015 for labs, visit, and Herceptin.

## 2015-08-13 ENCOUNTER — Ambulatory Visit: Payer: PPO

## 2015-08-27 ENCOUNTER — Ambulatory Visit (HOSPITAL_BASED_OUTPATIENT_CLINIC_OR_DEPARTMENT_OTHER): Payer: PPO

## 2015-08-27 ENCOUNTER — Encounter: Payer: Self-pay | Admitting: Hematology and Oncology

## 2015-08-27 ENCOUNTER — Telehealth: Payer: Self-pay | Admitting: Hematology and Oncology

## 2015-08-27 ENCOUNTER — Ambulatory Visit (HOSPITAL_BASED_OUTPATIENT_CLINIC_OR_DEPARTMENT_OTHER): Payer: PPO | Admitting: Hematology and Oncology

## 2015-08-27 ENCOUNTER — Other Ambulatory Visit (HOSPITAL_BASED_OUTPATIENT_CLINIC_OR_DEPARTMENT_OTHER): Payer: PPO

## 2015-08-27 VITALS — BP 135/61 | HR 60 | Temp 97.4°F | Resp 18 | Ht 61.0 in | Wt 157.1 lb

## 2015-08-27 DIAGNOSIS — C50211 Malignant neoplasm of upper-inner quadrant of right female breast: Secondary | ICD-10-CM

## 2015-08-27 DIAGNOSIS — Z5112 Encounter for antineoplastic immunotherapy: Secondary | ICD-10-CM | POA: Diagnosis not present

## 2015-08-27 DIAGNOSIS — Z171 Estrogen receptor negative status [ER-]: Secondary | ICD-10-CM | POA: Diagnosis not present

## 2015-08-27 LAB — CBC WITH DIFFERENTIAL/PLATELET
BASO%: 0.6 % (ref 0.0–2.0)
BASOS ABS: 0 10*3/uL (ref 0.0–0.1)
EOS%: 1.2 % (ref 0.0–7.0)
Eosinophils Absolute: 0.1 10*3/uL (ref 0.0–0.5)
HCT: 32.4 % — ABNORMAL LOW (ref 34.8–46.6)
HGB: 10.8 g/dL — ABNORMAL LOW (ref 11.6–15.9)
LYMPH%: 25.2 % (ref 14.0–49.7)
MCH: 31.3 pg (ref 25.1–34.0)
MCHC: 33.2 g/dL (ref 31.5–36.0)
MCV: 94 fL (ref 79.5–101.0)
MONO#: 0.4 10*3/uL (ref 0.1–0.9)
MONO%: 8.4 % (ref 0.0–14.0)
NEUT#: 2.9 10*3/uL (ref 1.5–6.5)
NEUT%: 64.6 % (ref 38.4–76.8)
Platelets: 223 10*3/uL (ref 145–400)
RBC: 3.44 10*6/uL — ABNORMAL LOW (ref 3.70–5.45)
RDW: 12.7 % (ref 11.2–14.5)
WBC: 4.5 10*3/uL (ref 3.9–10.3)
lymph#: 1.1 10*3/uL (ref 0.9–3.3)

## 2015-08-27 LAB — COMPREHENSIVE METABOLIC PANEL (CC13)
ALT: 21 U/L (ref 0–55)
AST: 20 U/L (ref 5–34)
Albumin: 3.7 g/dL (ref 3.5–5.0)
Alkaline Phosphatase: 85 U/L (ref 40–150)
Anion Gap: 7 mEq/L (ref 3–11)
BUN: 22.2 mg/dL (ref 7.0–26.0)
CALCIUM: 10 mg/dL (ref 8.4–10.4)
CHLORIDE: 110 meq/L — AB (ref 98–109)
CO2: 25 mEq/L (ref 22–29)
Creatinine: 1.1 mg/dL (ref 0.6–1.1)
EGFR: 48 mL/min/{1.73_m2} — ABNORMAL LOW (ref >90)
GLUCOSE: 92 mg/dL (ref 70–140)
POTASSIUM: 4.5 meq/L (ref 3.5–5.1)
SODIUM: 143 meq/L (ref 136–145)
Total Bilirubin: 0.38 mg/dL (ref 0.20–1.20)
Total Protein: 6.6 g/dL (ref 6.4–8.3)

## 2015-08-27 MED ORDER — HEPARIN SOD (PORK) LOCK FLUSH 100 UNIT/ML IV SOLN
500.0000 [IU] | Freq: Once | INTRAVENOUS | Status: AC | PRN
  Administered 2015-08-27: 500 [IU]
  Filled 2015-08-27: qty 5

## 2015-08-27 MED ORDER — TRASTUZUMAB CHEMO INJECTION 440 MG
6.0000 mg/kg | Freq: Once | INTRAVENOUS | Status: AC
  Administered 2015-08-27: 420 mg via INTRAVENOUS
  Filled 2015-08-27: qty 20

## 2015-08-27 MED ORDER — SODIUM CHLORIDE 0.9 % IV SOLN
Freq: Once | INTRAVENOUS | Status: AC
  Administered 2015-08-27: 12:00:00 via INTRAVENOUS

## 2015-08-27 MED ORDER — DIPHENHYDRAMINE HCL 25 MG PO CAPS
50.0000 mg | ORAL_CAPSULE | Freq: Once | ORAL | Status: AC
  Administered 2015-08-27: 25 mg via ORAL

## 2015-08-27 MED ORDER — ACETAMINOPHEN 325 MG PO TABS
650.0000 mg | ORAL_TABLET | Freq: Once | ORAL | Status: AC
  Administered 2015-08-27: 650 mg via ORAL

## 2015-08-27 MED ORDER — SODIUM CHLORIDE 0.9 % IJ SOLN
10.0000 mL | INTRAMUSCULAR | Status: DC | PRN
  Administered 2015-08-27: 10 mL
  Filled 2015-08-27: qty 10

## 2015-08-27 MED ORDER — DIPHENHYDRAMINE HCL 25 MG PO CAPS
ORAL_CAPSULE | ORAL | Status: AC
  Filled 2015-08-27: qty 1

## 2015-08-27 MED ORDER — ACETAMINOPHEN 325 MG PO TABS
ORAL_TABLET | ORAL | Status: AC
  Filled 2015-08-27: qty 2

## 2015-08-27 NOTE — Progress Notes (Signed)
Patient Care Team: Crist Infante, MD as PCP - General (Internal Medicine) Alphonsa Overall, MD as Consulting Physician (General Surgery) Nicholas Lose, MD as Consulting Physician (Hematology and Oncology) Gery Pray, MD as Consulting Physician (Radiation Oncology)  DIAGNOSIS: Breast cancer of upper-inner quadrant of right female breast [facility])   Staging form: Breast, AJCC 7th Edition     Clinical: Stage IIA (T2, N0, cM0) - Signed by Rulon Eisenmenger, MD on 08/13/2014     Pathologic stage from 01/14/2015: yT0, N0, cM0 - Signed by Enid Cutter, MD on 01/21/2015       Staging comments: Staged on final lumpectomy specimen by Dr. Lyndon Code.    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 Sondra Barges    CHIEF COMPLIANT: last Herceptin treatment  INTERVAL HISTORY: Kelli Owens is a 74 year old with above-mentioned history of right breast cancer with neoadjuvant chemotherapy and had a complete pathologic response. She is here today to receive her last Herceptin maintenance treatment. She has been tolerating Herceptin extremely well without any problems or concerns. She is very excited about completion of  treatment. She would like to get the port removed.  REVIEW OF SYSTEMS:   Constitutional: Denies fevers, chills or abnormal weight loss Eyes: Denies blurriness of vision Ears, nose, mouth, throat, and face: Denies mucositis or sore throat Respiratory: Denies cough, dyspnea or wheezes Cardiovascular: Denies palpitation, chest discomfort or lower extremity swelling Gastrointestinal:  Denies nausea, heartburn or change in bowel habits Skin: Denies abnormal skin rashes Lymphatics: Denies new lymphadenopathy or easy bruising Neurological:Denies numbness, tingling or new weaknesses Behavioral/Psych: Mood is stable, no new changes  Breast:  denies any pain or lumps or nodules in either breasts All other systems were reviewed with the patient and are negative.  I have reviewed the past medical history, past surgical history, social history and family history with the patient and they are unchanged from previous note.  ALLERGIES:  has No Known Allergies.  MEDICATIONS:  Current Outpatient Prescriptions  Medication Sig Dispense Refill  . acetaminophen (TYLENOL) 325 MG tablet Take 650 mg by mouth every 6 (six) hours as needed.    . cyanocobalamin (,VITAMIN B-12,) 1000 MCG/ML injection Inject 1,000 mcg into the muscle every 30 (thirty) days.    Marland Kitchen emollient (BIAFINE) cream Apply topically as needed.    Marland Kitchen HYDROcodone-acetaminophen (NORCO/VICODIN) 5-325 MG per tablet Take 1-2 tablets by mouth every 6 (six) hours as needed. 30 tablet 0  . lidocaine-prilocaine (EMLA) cream Apply 1 application topically as needed. 30 g 0  . LYRICA 75 MG capsule Take 75 mg by mouth 3 (three) times daily as needed (for pain).   0  . Multiple Vitamin (MULTIVITAMIN WITH MINERALS) TABS tablet Take 1 tablet by mouth daily.    Marland Kitchen  non-metallic deodorant (ALRA) MISC Apply 1 application topically daily as needed.    Marland Kitchen oxyCODONE-acetaminophen (PERCOCET/ROXICET) 5-325 MG per tablet Take 1 tablet by mouth every 4 (four) hours as needed  for moderate pain or severe pain. 15 tablet 0  . pantoprazole (PROTONIX) 40 MG tablet Take 40 mg by mouth daily as needed.    . temazepam (RESTORIL) 30 MG capsule Take 30 mg by mouth at bedtime as needed for sleep.    . Vitamin D, Ergocalciferol, (DRISDOL) 50000 UNITS CAPS Take 50,000 Units by mouth every 7 (seven) days. Takes on Thursday or Friday    . Wound Cleansers (RADIAPLEX EX) Apply topically.     No current facility-administered medications for this visit.    PHYSICAL EXAMINATION: ECOG PERFORMANCE STATUS: 1 - Symptomatic but completely ambulatory  Filed Vitals:   08/27/15 1034  BP: 135/61  Pulse: 60  Temp: 97.4 F (36.3 C)  Resp: 18   Filed Weights   08/27/15 1034  Weight: 157 lb 1.6 oz (71.26 kg)    GENERAL:alert, no distress and comfortable SKIN: skin color, texture, turgor are normal, no rashes or significant lesions EYES: normal, Conjunctiva are pink and non-injected, sclera clear OROPHARYNX:no exudate, no erythema and lips, buccal mucosa, and tongue normal  NECK: supple, thyroid normal size, non-tender, without nodularity LYMPH:  no palpable lymphadenopathy in the cervical, axillary or inguinal LUNGS: clear to auscultation and percussion with normal breathing effort HEART: regular rate & rhythm and no murmurs and no lower extremity edema ABDOMEN:abdomen soft, non-tender and normal bowel sounds Musculoskeletal:no cyanosis of digits and no clubbing  NEURO: alert & oriented x 3 with fluent speech, no focal motor/sensory deficits  LABORATORY DATA:  I have reviewed the data as listed   Chemistry      Component Value Date/Time   NA 142 08/06/2015 0914   NA 142 01/29/2015 0851   K 4.4 08/06/2015 0914   K 3.7 01/29/2015 0851   CL 108 01/29/2015 0851   CO2 27 08/06/2015 0914   CO2 29 01/29/2015 0851   BUN 23.3 08/06/2015 0914   BUN 20 01/29/2015 0851   CREATININE 1.1 08/06/2015 0914   CREATININE 1.06 01/29/2015 0851      Component Value Date/Time   CALCIUM  9.9 08/06/2015 0914   CALCIUM 9.5 01/29/2015 0851   ALKPHOS 65 08/06/2015 0914   ALKPHOS 72 01/29/2015 0851   AST 22 08/06/2015 0914   AST 20 01/29/2015 0851   ALT 19 08/06/2015 0914   ALT 15 01/29/2015 0851   BILITOT <0.30 08/06/2015 0914   BILITOT 0.2* 01/29/2015 0851       Lab Results  Component Value Date   WBC 4.5 08/27/2015   HGB 10.8* 08/27/2015   HCT 32.4* 08/27/2015   MCV 94.0 08/27/2015   PLT 223 08/27/2015   NEUTROABS 2.9 08/27/2015    ASSESSMENT & PLAN:  Breast cancer of upper-inner quadrant of right female breast ([place]) Right breast invasive mammary cancer most likely ductal phenotype grade 3, 2.4 cm mass at 3:00 position ER negative PR negative HER-2 positive ratio 2.75, Ki-67 80% clinical stage IIA, T2, N0, M0  Treatment summary: Neoadjuvant chemotherapy started 09/04/2014 with TCHP completed Cycle 6 of Herceptin Perjeta as of 12/18/2014 (Taxotere discontinued after cycle 4; carboplatin discontinued after cycle 1 Pathology Review: S/P lumpectomy: Showed pathologic CR, 0/1 LN Echocardiogram July 2016 shows ejection fraction 60-65% same as in November 2015   Plan: 1. Maintenance Herceptin completed November 10th  2016 2. No role of antiestrogen  therapy because she is ER/PR negative. 3.  Surveillance will be with breast exams every 6 months and mammograms annually , done on 07/20/2015 is normal.  Survivorship visit for care plan.  Return to clinic in 6 months for follow-up   No orders of the defined types were placed in this encounter.   The patient has a good understanding of the overall plan. she agrees with it. she will call with any problems that may develop before the next visit here.   Rulon Eisenmenger, MD 08/27/2015

## 2015-08-27 NOTE — Telephone Encounter (Signed)
Appointments made and avs printed for patient °

## 2015-08-27 NOTE — Patient Instructions (Signed)
[facility] Discharge Instructions for Patients Receiving Chemotherapy  Today you received the following chemotherapy agents:  Herceptin  To help prevent nausea and vomiting after your treatment, we encourage you to take your nausea medication as prescribed.   If you develop nausea and vomiting that is not controlled by your nausea medication, call the clinic.   BELOW ARE SYMPTOMS THAT SHOULD BE REPORTED IMMEDIATELY:  *FEVER GREATER THAN 100.5 F  *CHILLS WITH OR WITHOUT FEVER  NAUSEA AND VOMITING THAT IS NOT CONTROLLED WITH YOUR NAUSEA MEDICATION  *UNUSUAL SHORTNESS OF BREATH  *UNUSUAL BRUISING OR BLEEDING  TENDERNESS IN MOUTH AND THROAT WITH OR WITHOUT PRESENCE OF ULCERS  *URINARY PROBLEMS  *BOWEL PROBLEMS  UNUSUAL RASH Items with * indicate a potential emergency and should be followed up as soon as possible.  Feel free to call the clinic you have any questions or concerns. The clinic phone number is [phone].  Please show the CHEMO ALERT CARD at check-in to the Emergency Department and triage nurse.   

## 2015-08-27 NOTE — Assessment & Plan Note (Signed)
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 Maintenance Herceptin completed November 10th 2016 2. No role of antiestrogen therapy because she is ER/PR negative. 3. Surveillance will be with breast exams every 6 months and mammograms annually , done on 07/20/2015 is normal.  Return to clinic in 6 months for follow-up 

## 2015-08-28 NOTE — Patient Outreach (Signed)
[facility]) Care Management  08/28/2015  Kelli Owens [birth year] [id number]   Referral from HTA tier 4 list, assigned to [name] for patient outreach.  [name] [name] [name], [place] Care Management Assistant

## 2015-09-16 ENCOUNTER — Other Ambulatory Visit: Payer: Self-pay

## 2015-09-16 NOTE — Patient Outreach (Signed)
[place] [facility]) Care Management  09/16/2015  Kelli Owens 25-Nov-1940 [id number]   Telephone call to patient regarding health team advantage high risk referral.  Unable to reach patient. HIPAA compliant voice message left with call back phone number.   PLAN; RNCM will attempt 2nd telephone outreach to patient within 3 business days.   Quinn Plowman RN,BSN,CCM [place] Coordinator [phone]

## 2015-09-17 HISTORY — PX: PORTA CATH REMOVAL: CATH118286

## 2015-09-18 ENCOUNTER — Other Ambulatory Visit: Payer: Self-pay

## 2015-09-18 NOTE — Patient Outreach (Signed)
[place] [facility]) Care Management  09/18/2015  GENESSIS FIELDER 1941-03-19 [id number]   Telephone call to patient regarding Health team advantage high risk referral.  Unable to reach patient. HIPAA compliant voice message left with call back phone number.   PLAN: RNCM will attempt 3rd telephone outreach to patient within 1 week.  Quinn Plowman RN,BSN,CCM [place] Coordinator [phone]

## 2015-09-23 ENCOUNTER — Other Ambulatory Visit: Payer: Self-pay

## 2015-09-23 NOTE — Patient Outreach (Signed)
[facility]) Care Management  09/23/2015  NAYVIE PELTO 24-Sep-1941 [id number]   Third telephone call to patient regarding health team advantage high risk referral.  Attempted contact to home and mobile number. Unable to reach patient. HIPAA compliant voice message left with call back phone number.   PLAN: RNCM will send outreach letter to patient to attempt contact.  Quinn Plowman RN,BSN,CCM [place] Coordinator [phone]

## 2015-09-24 ENCOUNTER — Ambulatory Visit: Payer: Self-pay

## 2015-09-24 NOTE — Progress Notes (Signed)
This encounter was created in error - please disregard.

## 2015-09-24 NOTE — Addendum Note (Signed)
Addended by: Quinn Plowman E on: 09/24/2015 10:44 AM   Modules accepted: Level of Service, SmartSet

## 2015-10-08 ENCOUNTER — Other Ambulatory Visit: Payer: Self-pay

## 2015-10-08 NOTE — Patient Outreach (Signed)
[facility]) Care Management  10/08/2015  DRAYA HAFT 1941/05/30 [id number]  No response from patient after 3 telephone calls and letter outreach attempt.    PLAN: RNCM will refer patient to Damita Rhodie to close due to inability to establish contact./ RNCM will notify patients primary MD of closure reason.  Quinn Plowman RN,BSN,CCM [place] Coordinator [phone]

## 2016-03-01 ENCOUNTER — Encounter: Payer: PPO | Admitting: Nurse Practitioner

## 2016-03-02 ENCOUNTER — Telehealth: Payer: Self-pay | Admitting: Hematology and Oncology

## 2016-03-02 ENCOUNTER — Telehealth: Payer: Self-pay | Admitting: *Deleted

## 2016-03-02 NOTE — Telephone Encounter (Signed)
lvm for pt regarding to May and June appt.Marland KitchenMarland KitchenMarland Kitchen

## 2016-03-02 NOTE — Telephone Encounter (Signed)
lvm for pt to call back to r/s survivorship appt. emailed sara hansen how soon does pt need appt with MD.

## 2016-03-02 NOTE — Telephone Encounter (Signed)
LM for pt to rtn call to reschedule Oncology follow up. POF entered.

## 2016-03-08 ENCOUNTER — Ambulatory Visit: Payer: PPO | Admitting: Hematology and Oncology

## 2016-03-17 ENCOUNTER — Telehealth: Payer: Self-pay

## 2016-03-17 NOTE — Telephone Encounter (Signed)
Called pt in attempt to reschedule 6 month follow up which was originally scheduled for May.  Unable to reach pt.  Left VM with our contact information to return call and schedule.

## 2016-04-08 ENCOUNTER — Ambulatory Visit (HOSPITAL_BASED_OUTPATIENT_CLINIC_OR_DEPARTMENT_OTHER): Payer: PPO | Admitting: Nurse Practitioner

## 2016-04-08 ENCOUNTER — Encounter: Payer: Self-pay | Admitting: Nurse Practitioner

## 2016-04-08 ENCOUNTER — Telehealth: Payer: Self-pay | Admitting: Hematology and Oncology

## 2016-04-08 VITALS — BP 130/57 | HR 64 | Temp 97.8°F | Resp 18 | Ht 61.0 in | Wt 163.4 lb

## 2016-04-08 DIAGNOSIS — C50211 Malignant neoplasm of upper-inner quadrant of right female breast: Secondary | ICD-10-CM | POA: Diagnosis not present

## 2016-04-08 NOTE — Progress Notes (Signed)
CLINIC:  Cancer Survivorship   REASON FOR VISIT:  Routine follow-up post-treatment for a recent history of breast cancer.  BRIEF ONCOLOGIC HISTORY:    Breast cancer of upper-inner quadrant of right female breast ([place])   07/17/2014 Mammogram Right breast mass at 3:00 position by ultrasound measuring 2.4 cm   08/06/2014 Initial Biopsy Right breast core needle bx 3:00: Invasive ductal carcinoma, grade 3, ER- (0%), PR- (0%), HER-2 positive (ratio 2.75), Ki-67 85%   08/14/2014 Breast MRI Known malignancy in the lower inner quadrant of the right breast measuring 2.5 cm. Left breast negative.   08/14/2014 Clinical Stage Stage IIA: T2 N0   09/04/2014 - 08/27/2015 Neo-Adjuvant Chemotherapy Docetaxel, carboplatin, trastuzumab, and perjeta planned for 6 cycles followed by trastuzumab maintenance to complete one year carboplatin discontinued after cycle 2, Docetaxel dose reduced with cycle 3   09/06/2014 - 09/11/2014 Hospital Admission Hospitalization for pneumonia   12/25/2014 Breast MRI Post neoadjuvant MRI revealed no residual enhancement or mass in the area of the known right breast cancer   01/13/2015 Surgery Right lumpectomy/SLNB: Complete Pathologic Response - fibrocystic changes; 0/1 LN   01/13/2015 Pathologic Stage complete pathologic response: ypT0 ypN0   02/23/2015 - 03/24/2015 Radiation Therapy Adjuvant XRT Dr. Sondra Come: Right breast 52.72 Gy over 21 fractions    INTERVAL HISTORY:  [name] presents to the [facility] today for our initial meeting to review her survivorship care plan detailing her treatment course for breast cancer, as well as monitoring long-term side effects of that treatment, education regarding health maintenance, screening, and overall wellness and health promotion. She was to have seen Dr. Lindi Adie in follow up in May 2017 but has not yet done so.    Overall, [name] reports feeling quite well since completing her trastuzumab therapy approximately six months ago.   She denies any problems, specifically denying headache, cough, shortness of breath or bone pain.  She has a good appetite and denies weight loss.  She states that she would like to lose some weight. The skin changes over her right breast have completely resolved and she denies any continued fatigue.  She does not remember getting her mammogram in October 2016 but will check her calendar when she returns home.  Her port a cath was removed. Her last echocardiogram in October 2017 showed stable findings.  REVIEW OF SYSTEMS:  General: Denies fever, chills, unintentional weight loss, or generalized fatigue.  HEENT: Denies visual changes, hearing loss, mouth sores or difficulty swallowing. Cardiac: Denies palpitations, chest pain, and lower extremity edema.  Respiratory: Denies wheeze or dyspnea on exertion.  Breast: As above.  GI: Denies abdominal pain, constipation, diarrhea, nausea, or vomiting.  GU: Denies dysuria, hematuria, vaginal bleeding, vaginal discharge, or vaginal dryness.  Musculoskeletal: As above. Neuro: Denies recent fall or numbness / tingling in her extremities. Skin: Denies rash, pruritis, or open wounds.  Psych: Denies depression, anxiety, insomnia, or memory loss.   A 14-point review of systems was completed and was negative, except as noted above.   ONCOLOGY TREATMENT TEAM:  1. Surgeon:  Dr. Lucia Gaskins at [facility]  2. Medical Oncologist: Dr. Lindi Adie 3. Radiation Oncologist: Dr. Sondra Come    PAST MEDICAL/SURGICAL HISTORY:  Past Medical History  Diagnosis Date  . Depression   . Anxiety   . GERD (gastroesophageal reflux disease)   . Anemia   . Dyslipidemia   . DDD (degenerative disc disease), lumbar   . Osteopenia   . 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 HSV  infection   . Vitamin D deficiency   . Whooping cough   . Barrett's esophagus   . Breast cancer of upper-inner quadrant of right female breast ([place]) 08/08/2014  . Fibromyalgia   . Radiation 02/23/15-03/24/15      right breast   Past Surgical History  Procedure Laterality Date  . Appendectomy    . Total abdominal hysterectomy    . Bilateral salpingoophorectomy    . Portacath placement Left 08/26/2014    Procedure: INSERTION PORT-A-CATH;  Surgeon: Alphonsa Overall, MD;  Location: [facility];  Service: General;  Laterality: Left;  . Colonoscopy    . Breast lumpectomy with needle localization and axillary sentinel lymph node bx Right 01/13/15    RIGHT BREAST LUMPECTOMY WITH RADIOACTIVE SEED AND RIGHT AXILLARY SENTINEL LYMPH NODE BIOPSY     ALLERGIES:  No Known Allergies   CURRENT MEDICATIONS:  Current Outpatient Prescriptions on File Prior to Visit  Medication Sig Dispense Refill  . acetaminophen (TYLENOL) 325 MG tablet Take 650 mg by mouth every 6 (six) hours as needed.    Marland Kitchen LYRICA 75 MG capsule Take 75 mg by mouth 3 (three) times daily as needed (for pain).   0  . Multiple Vitamin (MULTIVITAMIN WITH MINERALS) TABS tablet Take 1 tablet by mouth daily.    . pantoprazole (PROTONIX) 40 MG tablet Take 40 mg by mouth daily as needed.    . temazepam (RESTORIL) 30 MG capsule Take 30 mg by mouth at bedtime as needed for sleep.     No current facility-administered medications on file prior to visit.     ONCOLOGIC FAMILY HISTORY:  Family History  Problem Relation Age of Onset  . Diabetes Father   . Stroke Mother   . Kidney disease Mother     lost rt. kidney due to stone  . Gastric cancer Brother   . Dementia Brother   . Cancer      groin area  . Colon cancer Sister 3     GENETIC COUNSELING/TESTING: No    SOCIAL HISTORY:  Kelli Owens is married and lives with her family in [place].  She has 1 child. Ms. [name] is currently retired. She denies any current or history of tobacco or illicit drug use. She uses alcohol on occasion (one margarita a couple of times a year).     PHYSICAL EXAMINATION:  Vital Signs: Filed Vitals:   04/08/16 0940  BP: 130/57  Pulse: 64   Temp: 97.8 F (36.6 C)  Resp: 18   ECOG Performance Status: 0  General: Well-nourished, well-appearing female in no acute distress.  She is unaccompanied in clinic today.   HEENT: Head is atraumatic and normocephalic.  Pupils equal and reactive to light and accomodation. Conjunctivae clear without exudate.  Sclerae anicteric. Oral mucosa is pink, moist, and intact without lesions.  Oropharynx is pink without lesions or erythema.  Lymph: No cervical, supraclavicular, infraclavicular, or axillary lymphadenopathy noted on palpation.  Cardiovascular: Regular rate and rhythm without murmurs, rubs, or gallops. Respiratory: Clear to auscultation bilaterally. Chest expansion symmetric without accessory muscle use on inspiration or expiration.  GI: Abdomen soft and round. No tenderness to palpation. Bowel sounds normoactive in 4 quadrants.  GU: Deferred.   Neuro: No focal deficits. Steady gait.  Psych: Mood and affect normal and appropriate for situation.  Extremities: No edema, cyanosis, or clubbing.  Skin: Warm and dry. No open lesions noted.   LABORATORY DATA:  None for this visit.  DIAGNOSTIC IMAGING:  None  for this visit.     ASSESSMENT AND PLAN:   1. 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  Ms. Crumbley is doing well without clinical symptoms worrisome for disease recurrence. She will follow-up with her  medical oncologist,  Dr. Lindi Adie, ASAP as she is overdue for this visit with history and physical examination per surveillance protocol. She will have labs prior to his appointment per his 11/16 orders. She will stop by scheduling to make this appointment on the way out today.  Her mammogram will be due in October 2017. A comprehensive survivorship care plan and treatment summary was reviewed with the patient today detailing her breast cancer diagnosis, treatment course, potential late/long-term effects of treatment, appropriate follow-up care with recommendations for the future, and patient education resources.  A copy of this summary, along with a letter will be sent to the patient's primary care provider via in basket message after today's visit.  Ms. Kiesling is welcome to return to the Survivorship Clinic in the future, as needed; no follow-up will be scheduled at this time.    2. Cancer screening:  Due to Ms. Suire's history and her age, she should receive screening for skin cancers and colon cancer.  She is S/P hysterectomy.  The information and recommendations are listed on the patient's comprehensive care plan/treatment summary and were reviewed in detail with the patient.    3. Health maintenance and wellness promotion: Ms. Kumagai was encouraged to consume 5-7 servings of fruits and vegetables per day. We reviewed the "Nutrition Rainbow" handout, as well as discussed recommendations to maximize nutrition and minimize recurrence, such as increased intake of fruits, vegetables, lean proteins, and minimizing the intake of red meats and processed foods.  She was also encouraged to engage in moderate to vigorous exercise for 30 minutes per day most days of the week. We discussed the [employer] fitness program, which is designed for cancer survivors to help them become more physically fit after cancer treatments.  She was instructed to limit her alcohol consumption and continue to abstain from tobacco  use.  A copy of the "Take Control of Your Health" brochure was given to her reinforcing these recommendations.   4. Support services/counseling: It is not uncommon for this period of the patient's cancer care trajectory to be one of many emotions and stressors.  We discussed an opportunity for her to participate in the next session of [facility] ("Finding Your New Normal") support group series designed for patients after they have completed treatment.  Ms. Sheaffer was encouraged to take advantage of our many other support services programs, support groups, and/or counseling in coping with her new life as a cancer survivor after completing anti-cancer treatment.  She was offered support today through active listening and expressive supportive counseling. She was given information regarding our available services and encouraged to contact me with any questions or for help enrolling in any of our support group/programs.    A total of 50 minutes of face-to-face time was spent with this patient with greater than 50% of  that time in counseling and care-coordination.   Sylvan Cheese, NP  Survivorship Program [place] [phone]   Note: PRIMARY CARE PROVIDER Jerlyn Ly, [place] [phone]

## 2016-04-08 NOTE — Telephone Encounter (Signed)
appt made and avs printed °

## 2016-04-19 NOTE — Assessment & Plan Note (Signed)
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 Maintenance Herceptin completed November 10th 2016 2. No role of antiestrogen therapy because she is ER/PR negative. 3. Surveillance will be with breast exams every 6 months and mammograms annually , done on 07/20/2015 is normal.  Return to clinic in 6 months for follow-up

## 2016-04-20 ENCOUNTER — Other Ambulatory Visit (HOSPITAL_BASED_OUTPATIENT_CLINIC_OR_DEPARTMENT_OTHER): Payer: PPO

## 2016-04-20 ENCOUNTER — Ambulatory Visit (HOSPITAL_BASED_OUTPATIENT_CLINIC_OR_DEPARTMENT_OTHER): Payer: PPO | Admitting: Hematology and Oncology

## 2016-04-20 ENCOUNTER — Encounter: Payer: Self-pay | Admitting: Hematology and Oncology

## 2016-04-20 ENCOUNTER — Telehealth: Payer: Self-pay | Admitting: Hematology and Oncology

## 2016-04-20 VITALS — BP 135/60 | HR 66 | Temp 98.1°F | Resp 18 | Ht 61.0 in | Wt 163.1 lb

## 2016-04-20 DIAGNOSIS — C50211 Malignant neoplasm of upper-inner quadrant of right female breast: Secondary | ICD-10-CM

## 2016-04-20 DIAGNOSIS — Z171 Estrogen receptor negative status [ER-]: Secondary | ICD-10-CM

## 2016-04-20 LAB — CBC WITH DIFFERENTIAL/PLATELET
BASO%: 0.5 % (ref 0.0–2.0)
Basophils Absolute: 0 10*3/uL (ref 0.0–0.1)
EOS%: 1.4 % (ref 0.0–7.0)
Eosinophils Absolute: 0.1 10*3/uL (ref 0.0–0.5)
HCT: 35.9 % (ref 34.8–46.6)
HGB: 12 g/dL (ref 11.6–15.9)
LYMPH#: 1.4 10*3/uL (ref 0.9–3.3)
LYMPH%: 35 % (ref 14.0–49.7)
MCH: 31.2 pg (ref 25.1–34.0)
MCHC: 33.5 g/dL (ref 31.5–36.0)
MCV: 93 fL (ref 79.5–101.0)
MONO#: 0.4 10*3/uL (ref 0.1–0.9)
MONO%: 10.1 % (ref 0.0–14.0)
NEUT#: 2.1 10*3/uL (ref 1.5–6.5)
NEUT%: 53 % (ref 38.4–76.8)
Platelets: 231 10*3/uL (ref 145–400)
RBC: 3.85 10*6/uL (ref 3.70–5.45)
RDW: 12.6 % (ref 11.2–14.5)
WBC: 4 10*3/uL (ref 3.9–10.3)

## 2016-04-20 LAB — COMPREHENSIVE METABOLIC PANEL
ALT: 22 U/L (ref 0–55)
AST: 24 U/L (ref 5–34)
Albumin: 3.7 g/dL (ref 3.5–5.0)
Alkaline Phosphatase: 63 U/L (ref 40–150)
Anion Gap: 8 mEq/L (ref 3–11)
BUN: 22.3 mg/dL (ref 7.0–26.0)
CHLORIDE: 109 meq/L (ref 98–109)
CO2: 24 meq/L (ref 22–29)
CREATININE: 1 mg/dL (ref 0.6–1.1)
Calcium: 9.7 mg/dL (ref 8.4–10.4)
EGFR: 52 mL/min/{1.73_m2} — AB (ref >90)
GLUCOSE: 98 mg/dL (ref 70–140)
Potassium: 4.5 mEq/L (ref 3.5–5.1)
SODIUM: 141 meq/L (ref 136–145)
TOTAL PROTEIN: 7 g/dL (ref 6.4–8.3)
Total Bilirubin: 0.33 mg/dL (ref 0.20–1.20)

## 2016-04-20 NOTE — Progress Notes (Signed)
Patient Care Team: Crist Infante, MD as PCP - General (Internal Medicine) Alphonsa Overall, MD as Consulting Physician (General Surgery) Nicholas Lose, MD as Consulting Physician (Hematology and Oncology) Gery Pray, MD as Consulting Physician (Radiation Oncology) Sylvan Cheese, NP as Nurse Practitioner (Hematology and Oncology)  DIAGNOSIS: Breast cancer of upper-inner quadrant of right female breast [facility])   Staging form: Breast, AJCC 7th Edition     Clinical: Stage IIA (T2, N0, cM0) - Signed by Rulon Eisenmenger, MD on 08/13/2014     Pathologic stage from 01/14/2015: yT0, N0, cM0 - Signed by Enid Cutter, MD on 01/21/2015       Staging comments: Staged on final lumpectomy specimen by Dr. Lyndon Code.  SUMMARY OF ONCOLOGIC HISTORY:   Breast cancer of upper-inner quadrant of right female breast ([place])   07/17/2014 Mammogram Right breast mass at 3:00 position by ultrasound measuring 2.4 cm   08/06/2014 Initial Biopsy Right breast core needle bx 3:00: Invasive ductal carcinoma, grade 3, ER- (0%), PR- (0%), HER-2 positive (ratio 2.75), Ki-67 85%   08/14/2014 Breast MRI Known malignancy in the lower inner quadrant of the right breast measuring 2.5 cm. Left breast negative.   08/14/2014 Clinical Stage Stage IIA: T2 N0   09/04/2014 - 08/27/2015 Neo-Adjuvant Chemotherapy Docetaxel, carboplatin, trastuzumab, and perjeta planned for 6 cycles followed by trastuzumab maintenance to complete one year carboplatin discontinued after cycle 2, Docetaxel dose reduced with cycle 3   09/06/2014 - 09/11/2014 Hospital Admission Hospitalization for pneumonia   12/25/2014 Breast MRI Post neoadjuvant MRI revealed no residual enhancement or mass in the area of the known right breast cancer   01/13/2015 Surgery Right lumpectomy/SLNB: Complete Pathologic Response - fibrocystic changes; 0/1 LN   01/13/2015 Pathologic Stage complete pathologic response: ypT0 ypN0   02/23/2015 - 03/24/2015 Radiation Therapy Adjuvant XRT Dr. Sondra Come:  Right breast 52.72 Gy over 21 fractions   04/08/2016 Survivorship SCP visit completed today and copy of care plan given to patient    CHIEF COMPLIANT: Breast cancer follow-up  INTERVAL HISTORY: Kelli Owens is a 75 year old with above-mentioned history of right breast cancer triple negative disease who received neoadjuvant chemotherapy with complete response. She completed adjuvant radiation and is currently on surveillance. She reports no new problems or concerns. Her husband has Parkinson's disease and this limits the ability to travel or go on vacations. She denies any problems lungs or any other concerns with her breasts.  REVIEW OF SYSTEMS:   Constitutional: Denies fevers, chills or abnormal weight loss Eyes: Denies blurriness of vision Ears, nose, mouth, throat, and face: Denies mucositis or sore throat Respiratory: Denies cough, dyspnea or wheezes Cardiovascular: Denies palpitation, chest discomfort Gastrointestinal:  Denies nausea, heartburn or change in bowel habits Skin: Denies abnormal skin rashes Lymphatics: Denies new lymphadenopathy or easy bruising Neurological:Denies numbness, tingling or new weaknesses Behavioral/Psych: Mood is stable, no new changes  Extremities: No lower extremity edema Breast:  denies any pain or lumps or nodules in either breasts All other systems were reviewed with the patient and are negative.  I have reviewed the past medical history, past surgical history, social history and family history with the patient and they are unchanged from previous note.  ALLERGIES:  has No Known Allergies.  MEDICATIONS:  Current Outpatient Prescriptions  Medication Sig Dispense Refill  . acetaminophen (TYLENOL) 325 MG tablet Take 650 mg by mouth every 6 (six) hours as needed.    Marland Kitchen LYRICA 75 MG capsule Take 75 mg by mouth 3 (three) times daily  as needed (for pain).   0  . Multiple Vitamin (MULTIVITAMIN WITH MINERALS) TABS tablet Take 1 tablet by mouth daily.      . pantoprazole (PROTONIX) 40 MG tablet Take 40 mg by mouth daily as needed.    . temazepam (RESTORIL) 30 MG capsule Take 30 mg by mouth at bedtime as needed for sleep.     No current facility-administered medications for this visit.    PHYSICAL EXAMINATION: ECOG PERFORMANCE STATUS: 0 - Asymptomatic  Filed Vitals:   04/20/16 0911  BP: 135/60  Pulse: 66  Temp: 98.1 F (36.7 C)  Resp: 18   Filed Weights   04/20/16 0911  Weight: 163 lb 1.6 oz (73.982 kg)    GENERAL:alert, no distress and comfortable SKIN: skin color, texture, turgor are normal, no rashes or significant lesions EYES: normal, Conjunctiva are pink and non-injected, sclera clear OROPHARYNX:no exudate, no erythema and lips, buccal mucosa, and tongue normal  NECK: supple, thyroid normal size, non-tender, without nodularity LYMPH:  no palpable lymphadenopathy in the cervical, axillary or inguinal LUNGS: clear to auscultation and percussion with normal breathing effort HEART: regular rate & rhythm and no murmurs and no lower extremity edema ABDOMEN:abdomen soft, non-tender and normal bowel sounds MUSCULOSKELETAL:no cyanosis of digits and no clubbing  NEURO: alert & oriented x 3 with fluent speech, no focal motor/sensory deficits EXTREMITIES: No lower extremity edema BREAST: No palpable masses or nodules in either right or left breasts. No palpable axillary supraclavicular or infraclavicular adenopathy no breast tenderness or nipple discharge. (exam performed in the presence of a chaperone)  LABORATORY DATA:  I have reviewed the data as listed   Chemistry      Component Value Date/Time   NA 143 08/27/2015 1017   NA 142 01/29/2015 0851   K 4.5 08/27/2015 1017   K 3.7 01/29/2015 0851   CL 108 01/29/2015 0851   CO2 25 08/27/2015 1017   CO2 29 01/29/2015 0851   BUN 22.2 08/27/2015 1017   BUN 20 01/29/2015 0851   CREATININE 1.1 08/27/2015 1017   CREATININE 1.06 01/29/2015 0851      Component Value Date/Time    CALCIUM 10.0 08/27/2015 1017   CALCIUM 9.5 01/29/2015 0851   ALKPHOS 85 08/27/2015 1017   ALKPHOS 72 01/29/2015 0851   AST 20 08/27/2015 1017   AST 20 01/29/2015 0851   ALT 21 08/27/2015 1017   ALT 15 01/29/2015 0851   BILITOT 0.38 08/27/2015 1017   BILITOT 0.2* 01/29/2015 0851       Lab Results  Component Value Date   WBC 4.0 04/20/2016   HGB 12.0 04/20/2016   HCT 35.9 04/20/2016   MCV 93.0 04/20/2016   PLT 231 04/20/2016   NEUTROABS 2.1 04/20/2016     ASSESSMENT & PLAN:  Breast cancer of upper-inner quadrant of right female breast ([place]) Right breast invasive mammary cancer most likely ductal phenotype grade 3, 2.4 cm mass at 3:00 position ER negative PR negative HER-2 positive ratio 2.75, Ki-67 80% clinical stage IIA, T2, N0, M0  Treatment summary: Neoadjuvant chemotherapy started 09/04/2014 with TCHP completed Cycle 6 of Herceptin Perjeta as of 12/18/2014 (Taxotere discontinued after cycle 4; carboplatin discontinued after cycle 1 Pathology Review: S/P lumpectomy: Showed pathologic CR, 0/1 LN Echocardiogram July 2016 shows ejection fraction 60-65% same as in November 2015   Plan: 1. Maintenance Herceptin completed November 10th 2016 2. No role of antiestrogen therapy because she is ER/PR negative. 3. Surveillance will be with breast exams every 6 months  and mammograms annually , done on 07/20/2015 is normal.  Return to clinic in 6 months for follow-up   No orders of the defined types were placed in this encounter.   The patient has a good understanding of the overall plan. she agrees with it. she will call with any problems that may develop before the next visit here.   Rulon Eisenmenger, MD 04/20/2016

## 2016-04-20 NOTE — Telephone Encounter (Signed)
cld pt and left a message of time & date of appt fo 10/26/16@10 

## 2016-06-08 ENCOUNTER — Encounter: Payer: Self-pay | Admitting: Internal Medicine

## 2016-06-23 ENCOUNTER — Other Ambulatory Visit: Payer: Self-pay | Admitting: Internal Medicine

## 2016-06-23 ENCOUNTER — Other Ambulatory Visit: Payer: Self-pay | Admitting: Hematology and Oncology

## 2016-06-23 DIAGNOSIS — Z853 Personal history of malignant neoplasm of breast: Secondary | ICD-10-CM

## 2016-06-30 DIAGNOSIS — N390 Urinary tract infection, site not specified: Secondary | ICD-10-CM | POA: Diagnosis not present

## 2016-06-30 DIAGNOSIS — E784 Other hyperlipidemia: Secondary | ICD-10-CM | POA: Diagnosis not present

## 2016-06-30 DIAGNOSIS — E538 Deficiency of other specified B group vitamins: Secondary | ICD-10-CM | POA: Diagnosis not present

## 2016-06-30 DIAGNOSIS — M810 Age-related osteoporosis without current pathological fracture: Secondary | ICD-10-CM | POA: Diagnosis not present

## 2016-06-30 DIAGNOSIS — R8299 Other abnormal findings in urine: Secondary | ICD-10-CM | POA: Diagnosis not present

## 2016-07-07 DIAGNOSIS — Z1389 Encounter for screening for other disorder: Secondary | ICD-10-CM | POA: Diagnosis not present

## 2016-07-07 DIAGNOSIS — Z0000 Encounter for general adult medical examination without abnormal findings: Secondary | ICD-10-CM | POA: Diagnosis not present

## 2016-07-07 DIAGNOSIS — E784 Other hyperlipidemia: Secondary | ICD-10-CM | POA: Diagnosis not present

## 2016-07-07 DIAGNOSIS — G4709 Other insomnia: Secondary | ICD-10-CM | POA: Diagnosis not present

## 2016-07-07 DIAGNOSIS — D6489 Other specified anemias: Secondary | ICD-10-CM | POA: Diagnosis not present

## 2016-07-07 DIAGNOSIS — J3089 Other allergic rhinitis: Secondary | ICD-10-CM | POA: Diagnosis not present

## 2016-07-07 DIAGNOSIS — C50211 Malignant neoplasm of upper-inner quadrant of right female breast: Secondary | ICD-10-CM | POA: Diagnosis not present

## 2016-07-07 DIAGNOSIS — Z23 Encounter for immunization: Secondary | ICD-10-CM | POA: Diagnosis not present

## 2016-07-07 DIAGNOSIS — Z1231 Encounter for screening mammogram for malignant neoplasm of breast: Secondary | ICD-10-CM | POA: Diagnosis not present

## 2016-07-07 DIAGNOSIS — R5383 Other fatigue: Secondary | ICD-10-CM | POA: Diagnosis not present

## 2016-07-07 DIAGNOSIS — E538 Deficiency of other specified B group vitamins: Secondary | ICD-10-CM | POA: Diagnosis not present

## 2016-07-07 DIAGNOSIS — Z6829 Body mass index (BMI) 29.0-29.9, adult: Secondary | ICD-10-CM | POA: Diagnosis not present

## 2016-07-07 DIAGNOSIS — M810 Age-related osteoporosis without current pathological fracture: Secondary | ICD-10-CM | POA: Diagnosis not present

## 2016-07-26 ENCOUNTER — Ambulatory Visit
Admission: RE | Admit: 2016-07-26 | Discharge: 2016-07-26 | Disposition: A | Discharge status: Home / Self Care | Payer: PPO | Source: Ambulatory Visit | Attending: Internal Medicine | Admitting: Internal Medicine

## 2016-07-26 DIAGNOSIS — Z853 Personal history of malignant neoplasm of breast: Secondary | ICD-10-CM

## 2016-07-26 DIAGNOSIS — R928 Other abnormal and inconclusive findings on diagnostic imaging of breast: Secondary | ICD-10-CM | POA: Diagnosis not present

## 2016-10-19 IMAGING — CT CT ANGIO CHEST
1 of 3 series · 18 of 33 positions shown · IV contrast (OMNIPAQUE 350)
Comparison: Chest radiograph 09/06/2014

CLINICAL DATA: Evaluate for PE, fever and weakness

EXAM:
CT ANGIOGRAPHY CHEST WITH CONTRAST
TECHNIQUE: Multidetector CT imaging of the chest was performed using the
standard protocol during bolus administration of intravenous
contrast. Multiplanar CT image reconstructions and MIPs were
obtained to evaluate the vascular anatomy.
CONTRAST:  100mL OMNIPAQUE IOHEXOL 350 MG/ML SOLN

[Series 6: thins for pacs · axial · 0.66mm/px · z∈[-316,-79]mm · 18 of 271 slices shown]
[im 17/271  lung]
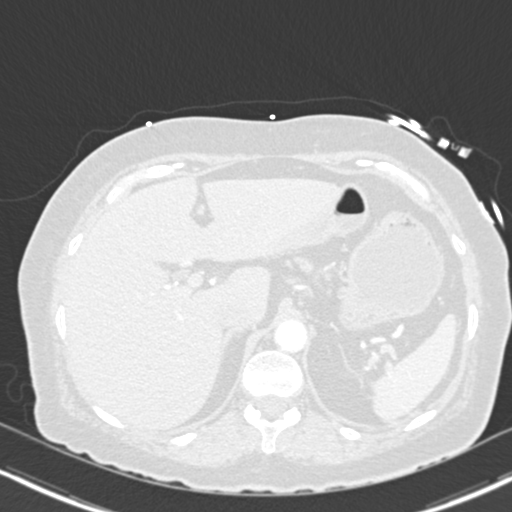
[im 34/271  mediastinal]
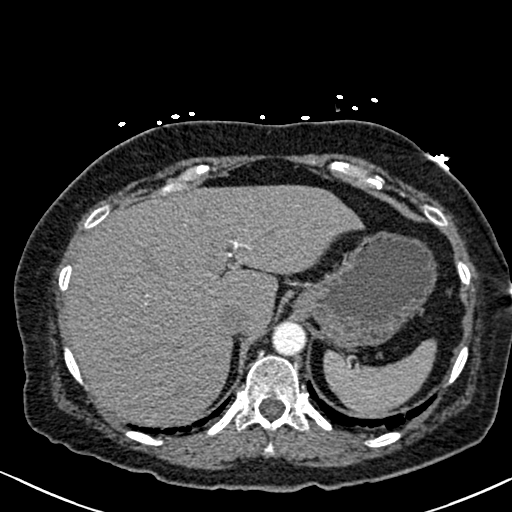
[im 51/271  lung]
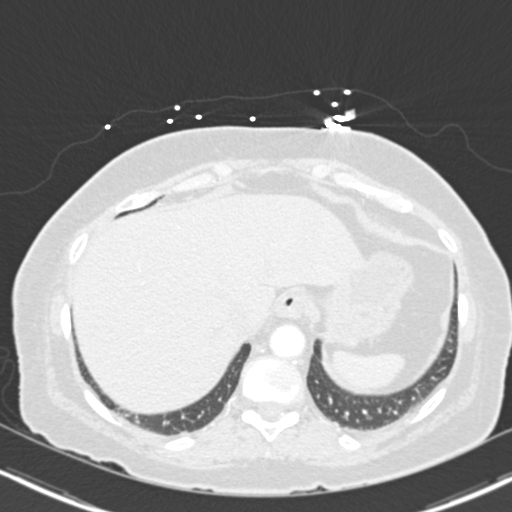
[im 68/271  mediastinal]
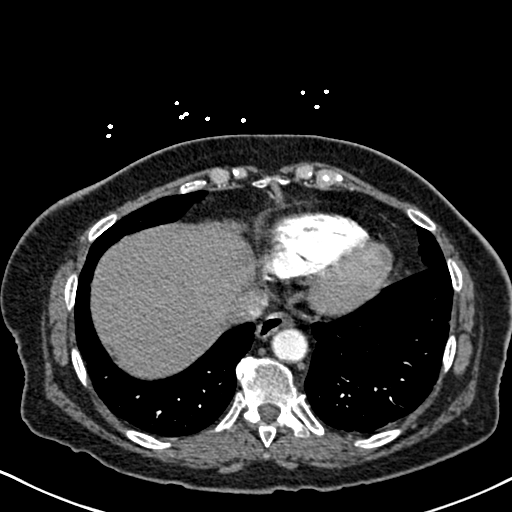
[im 85/271  lung]
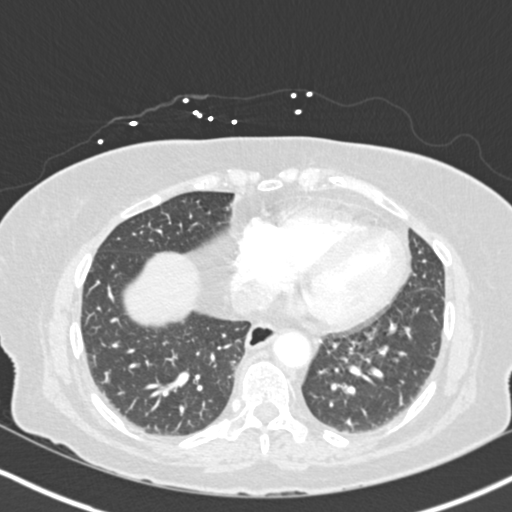
[im 91/271  mediastinal]
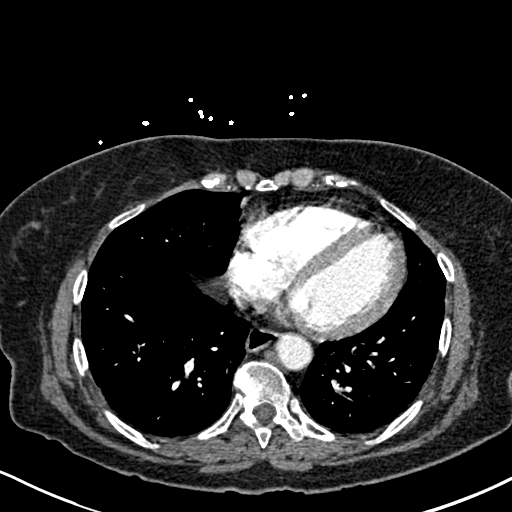
[im 102/271  lung]
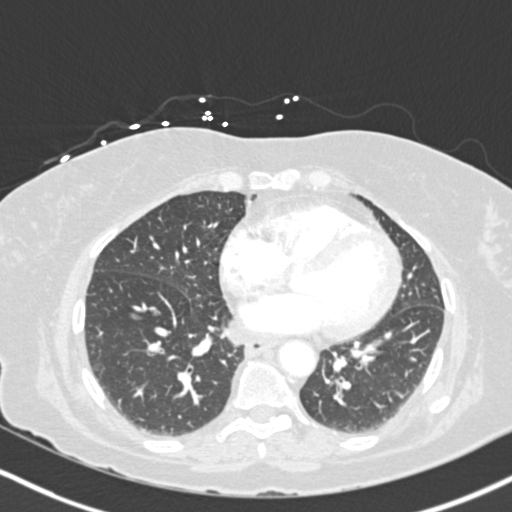
[im 119/271  mediastinal]
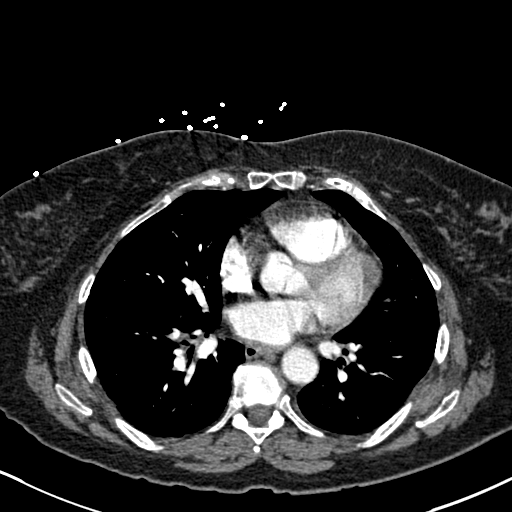
[im 126/271  lung]
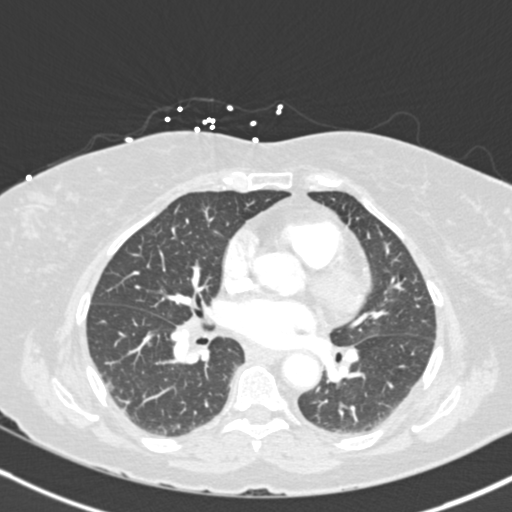
[im 136/271  mediastinal]
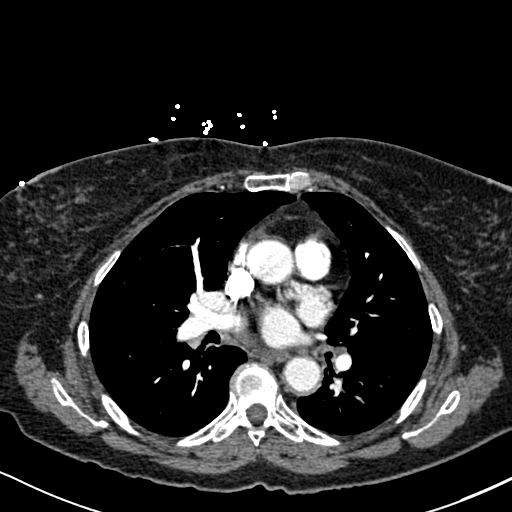
[im 152/271  lung]
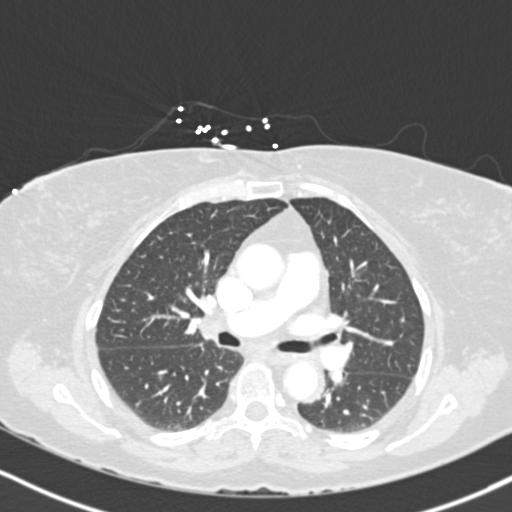
[im 169/271  mediastinal]
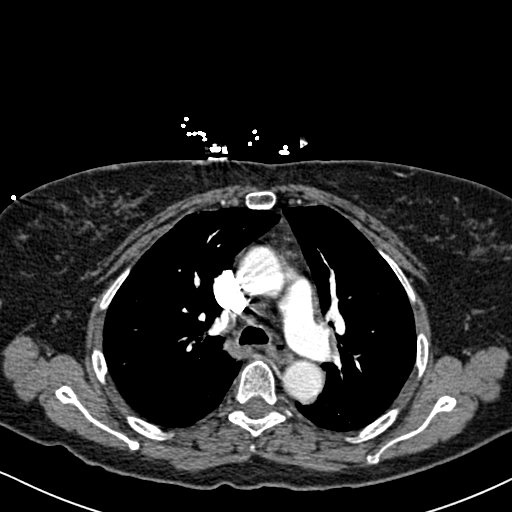
[im 181/271  lung]
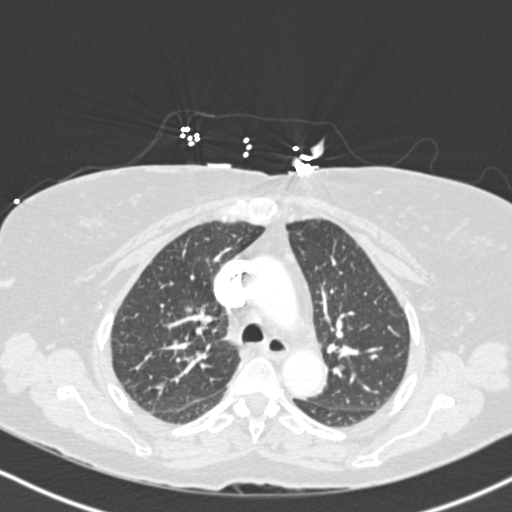
[im 186/271  mediastinal]
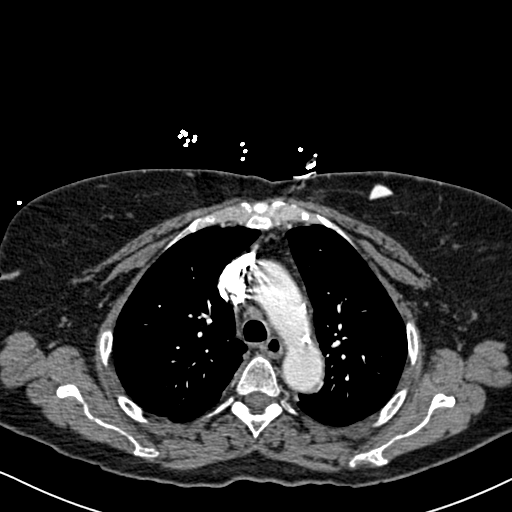
[im 203/271  lung]
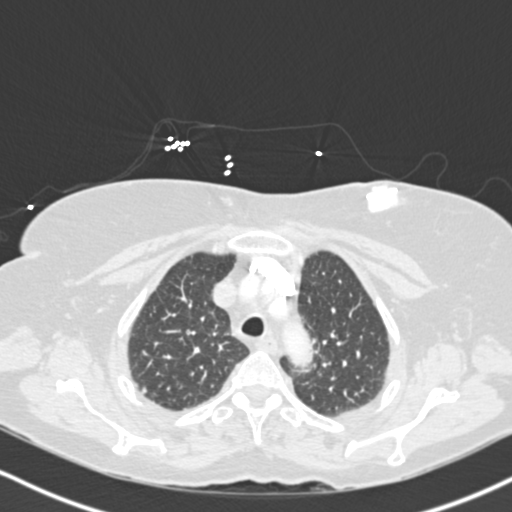
[im 220/271  mediastinal]
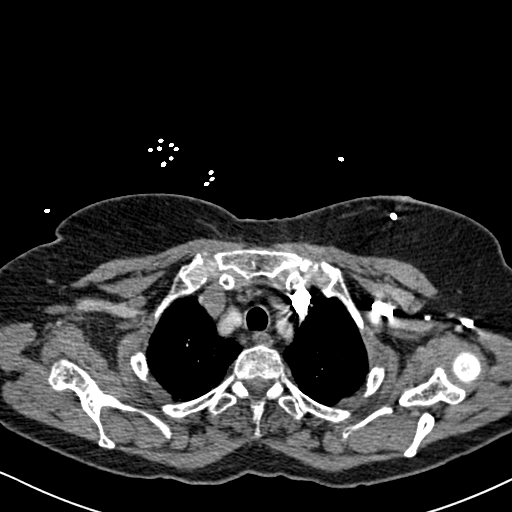
[im 237/271  lung]
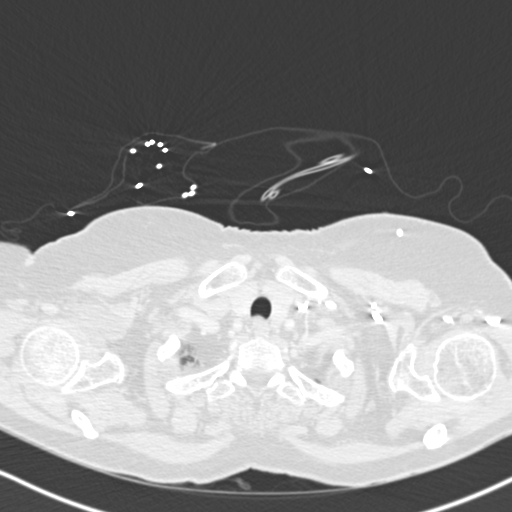
[im 254/271  mediastinal]
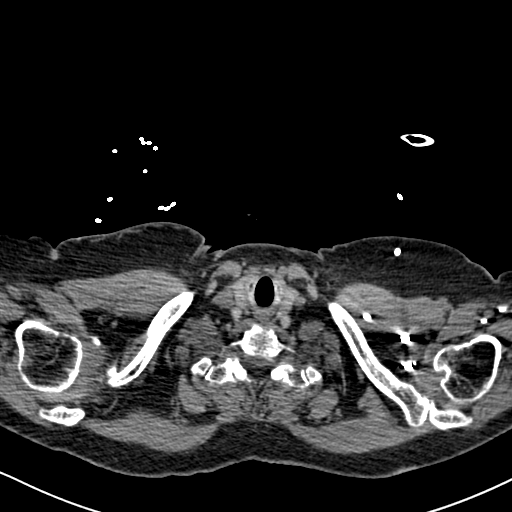

[18 of 33 positions shown; findings below may reference images not displayed]

FINDINGS: There are no filling defects within pulmonary arteries to suggest
acute pulmonary embolism. No acute findings aorta great vessels. No
pericardial fluid. Esophagus is normal.

Review of the lung parenchyma demonstrates a lobular septal
thickening. There is a sub solid nodule in the right lower lobe
measuring 4 mm (image 60, series 7).

No axillary or supraclavicular adenopathy. No mediastinal
adenopathy.

Limited upper abdomen is unremarkable. No aggressive osseous lesion.

Review of the MIP images confirms the above findings.
IMPRESSION: 1. No evidence of acute pulmonary embolism.
2. Mild interlobular septal thickening suggests mild interstitial
edema.
3. Small sub solid solid nodule in the right lower lobe likely
represent small focus of infection. Recommend follow-up CT without
contrast in 3 months to determine if this lesion persists.

## 2016-10-19 IMAGING — CR DG CHEST 2V
2 series · 2 of 2 positions shown · non-contrast
Comparison: 08/26/2014

CLINICAL DATA: Short of breath and weakness.

EXAM:
CHEST  2 VIEW

[w chest pa]
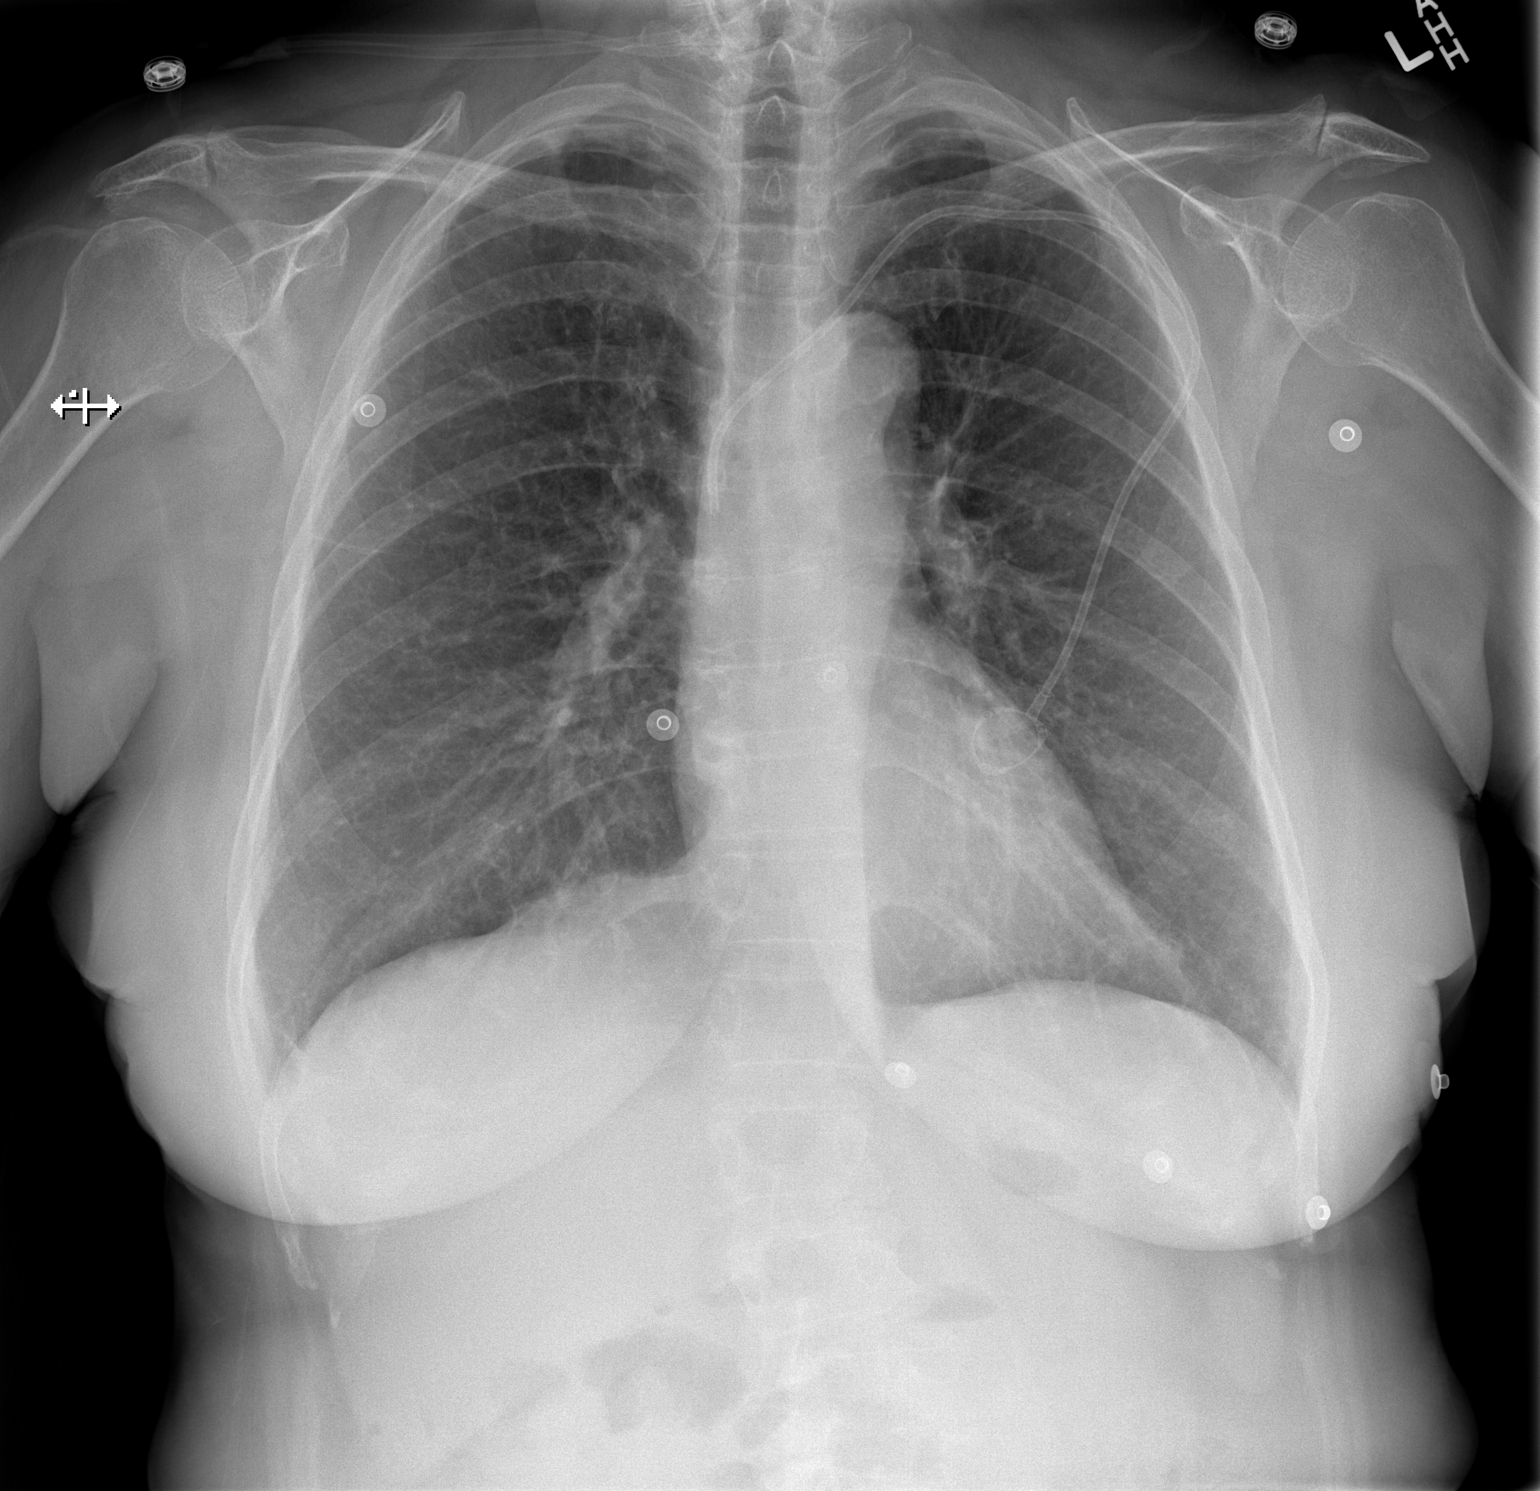

[w chest lat]
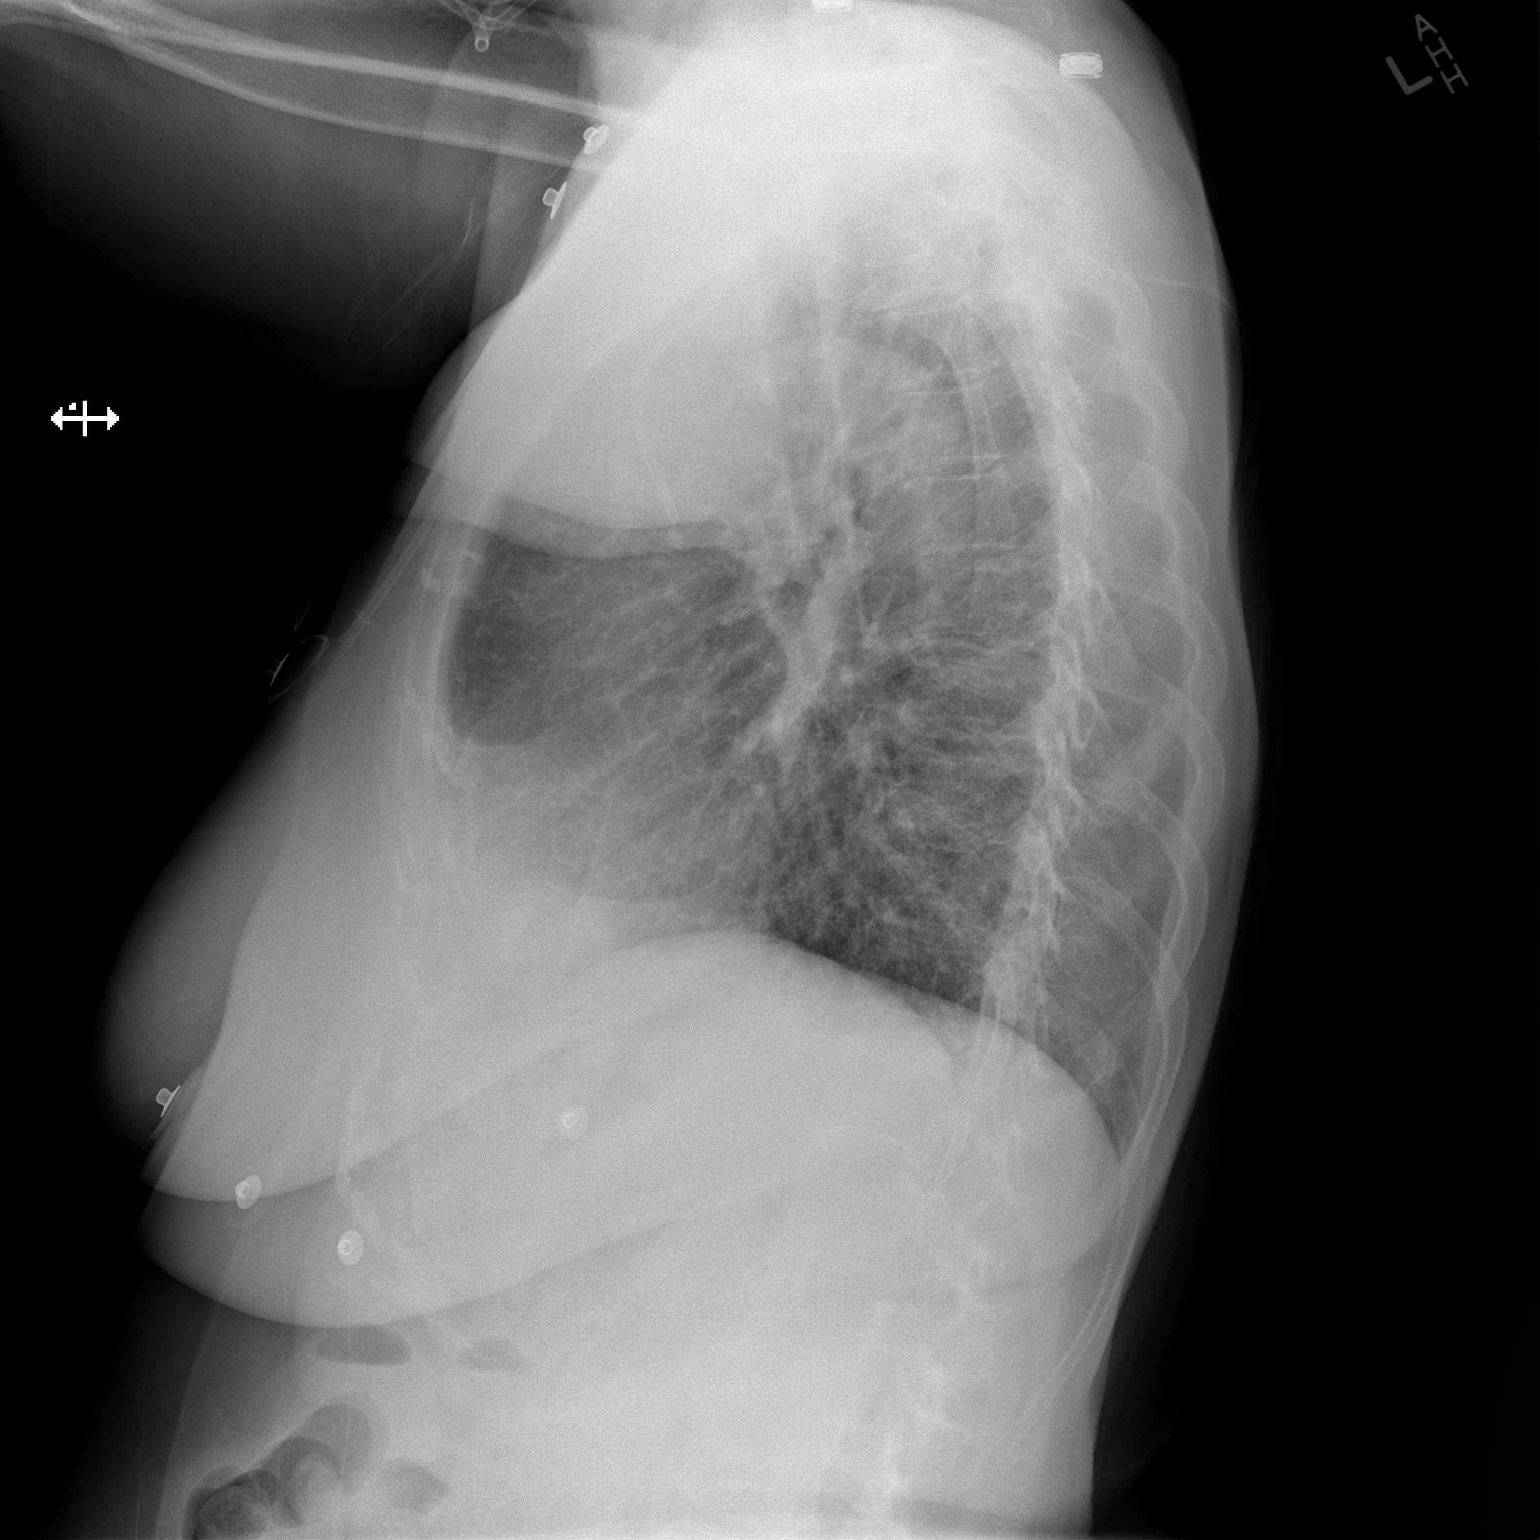

[2 of 2 positions shown; findings below may reference images not displayed]

FINDINGS: Left-sided power port unchanged. Normal cardiac silhouette. Chronic
bronchitic markings noted. No effusion, infiltrate, pneumothorax.
IMPRESSION: 1. No acute cardiopulmonary process.
2. Chronic bronchitic markings.

## 2016-10-22 ENCOUNTER — Other Ambulatory Visit: Payer: Self-pay | Admitting: Nurse Practitioner

## 2016-10-24 IMAGING — DX DG ABD PORTABLE 1V
1 series · 1 of 1 positions shown · non-contrast
Comparison: Chest CTA 09/06/2014.  Pelvis radiographs 04/16/2006.

CLINICAL DATA: 73-year-old female with nausea vomiting diarrhea.
Initial encounter.

EXAM:
PORTABLE ABDOMEN - 1 VIEW

[AP]
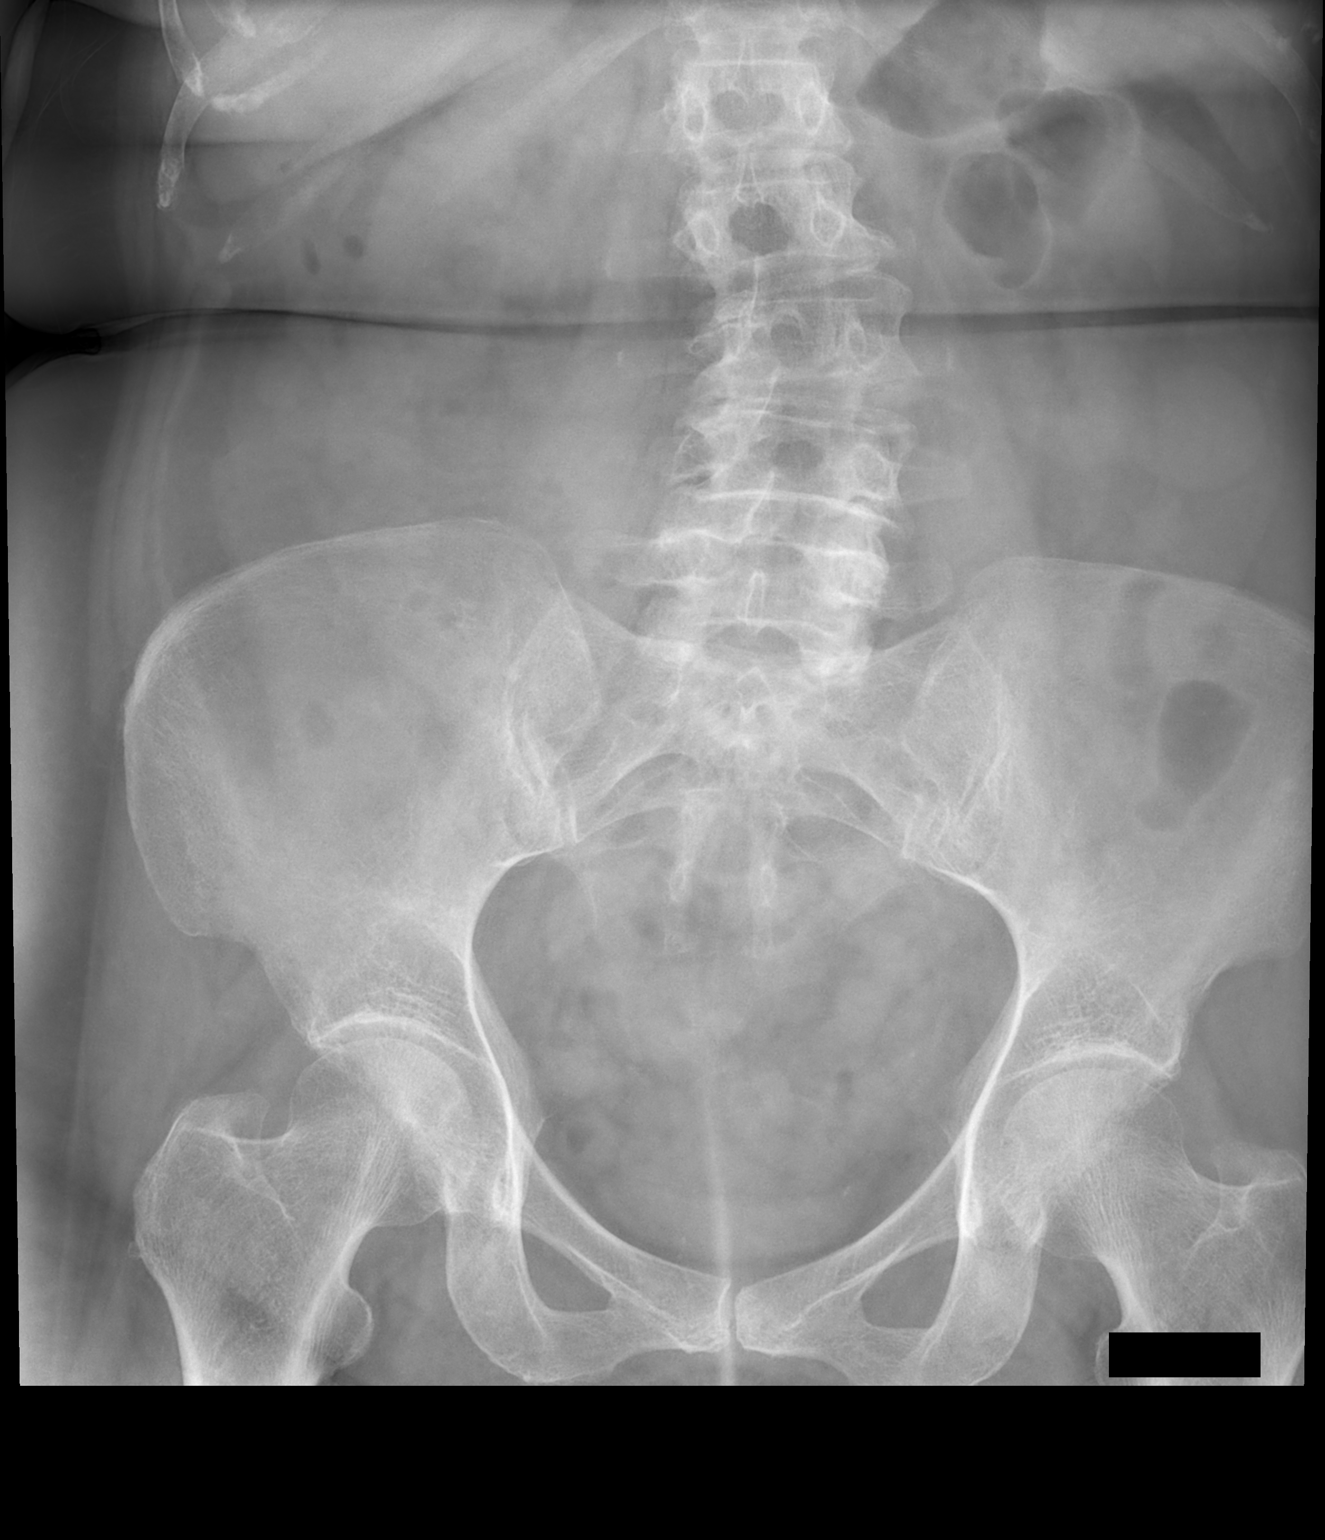

[1 of 1 positions shown; findings below may reference images not displayed]

FINDINGS: Portable AP supine view at 8188 hrs. Moderate levoconvex lumbar
scoliosis. The upper abdomen is not entirely included. The level of
the diaphragm is not included. Non obstructed bowel gas pattern.
Abdominal and pelvic visceral contours are within normal limits. No
definite pneumoperitoneum on this supine view. No acute osseous
abnormality identified.
IMPRESSION: Negative supine KUB view of the abdomen.

## 2016-10-25 NOTE — Assessment & Plan Note (Deleted)
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 Maintenance Herceptin completed November 10th 2016 2. No role of antiestrogen therapy because she is ER/PR negative. 3. Surveillance will be with breast exams every 6 months and mammograms annually , done on 07/20/2015 is normal.  Return to clinic in 1 year for follow-up

## 2016-10-26 ENCOUNTER — Ambulatory Visit: Payer: PPO | Admitting: Hematology and Oncology

## 2016-12-08 DIAGNOSIS — M5412 Radiculopathy, cervical region: Secondary | ICD-10-CM | POA: Diagnosis not present

## 2016-12-08 DIAGNOSIS — M542 Cervicalgia: Secondary | ICD-10-CM | POA: Diagnosis not present

## 2016-12-14 DIAGNOSIS — M79601 Pain in right arm: Secondary | ICD-10-CM | POA: Diagnosis not present

## 2016-12-14 DIAGNOSIS — M542 Cervicalgia: Secondary | ICD-10-CM | POA: Diagnosis not present

## 2016-12-14 DIAGNOSIS — Z6830 Body mass index (BMI) 30.0-30.9, adult: Secondary | ICD-10-CM | POA: Diagnosis not present

## 2017-01-04 DIAGNOSIS — S29019A Strain of muscle and tendon of unspecified wall of thorax, initial encounter: Secondary | ICD-10-CM | POA: Diagnosis not present

## 2017-01-04 DIAGNOSIS — M9902 Segmental and somatic dysfunction of thoracic region: Secondary | ICD-10-CM | POA: Diagnosis not present

## 2017-01-04 DIAGNOSIS — M5032 Other cervical disc degeneration, mid-cervical region, unspecified level: Secondary | ICD-10-CM | POA: Diagnosis not present

## 2017-01-04 DIAGNOSIS — S39012A Strain of muscle, fascia and tendon of lower back, initial encounter: Secondary | ICD-10-CM | POA: Diagnosis not present

## 2017-01-04 DIAGNOSIS — M9903 Segmental and somatic dysfunction of lumbar region: Secondary | ICD-10-CM | POA: Diagnosis not present

## 2017-01-04 DIAGNOSIS — M9901 Segmental and somatic dysfunction of cervical region: Secondary | ICD-10-CM | POA: Diagnosis not present

## 2017-01-04 DIAGNOSIS — M542 Cervicalgia: Secondary | ICD-10-CM | POA: Diagnosis not present

## 2017-01-05 DIAGNOSIS — S29019A Strain of muscle and tendon of unspecified wall of thorax, initial encounter: Secondary | ICD-10-CM | POA: Diagnosis not present

## 2017-01-05 DIAGNOSIS — S39012A Strain of muscle, fascia and tendon of lower back, initial encounter: Secondary | ICD-10-CM | POA: Diagnosis not present

## 2017-01-05 DIAGNOSIS — M9902 Segmental and somatic dysfunction of thoracic region: Secondary | ICD-10-CM | POA: Diagnosis not present

## 2017-01-05 DIAGNOSIS — M5032 Other cervical disc degeneration, mid-cervical region, unspecified level: Secondary | ICD-10-CM | POA: Diagnosis not present

## 2017-01-05 DIAGNOSIS — M542 Cervicalgia: Secondary | ICD-10-CM | POA: Diagnosis not present

## 2017-01-05 DIAGNOSIS — M9901 Segmental and somatic dysfunction of cervical region: Secondary | ICD-10-CM | POA: Diagnosis not present

## 2017-01-05 DIAGNOSIS — M9903 Segmental and somatic dysfunction of lumbar region: Secondary | ICD-10-CM | POA: Diagnosis not present

## 2017-01-06 DIAGNOSIS — M9903 Segmental and somatic dysfunction of lumbar region: Secondary | ICD-10-CM | POA: Diagnosis not present

## 2017-01-06 DIAGNOSIS — S39012A Strain of muscle, fascia and tendon of lower back, initial encounter: Secondary | ICD-10-CM | POA: Diagnosis not present

## 2017-01-06 DIAGNOSIS — M9902 Segmental and somatic dysfunction of thoracic region: Secondary | ICD-10-CM | POA: Diagnosis not present

## 2017-01-06 DIAGNOSIS — M5032 Other cervical disc degeneration, mid-cervical region, unspecified level: Secondary | ICD-10-CM | POA: Diagnosis not present

## 2017-01-06 DIAGNOSIS — M542 Cervicalgia: Secondary | ICD-10-CM | POA: Diagnosis not present

## 2017-01-06 DIAGNOSIS — M9901 Segmental and somatic dysfunction of cervical region: Secondary | ICD-10-CM | POA: Diagnosis not present

## 2017-01-06 DIAGNOSIS — S29019A Strain of muscle and tendon of unspecified wall of thorax, initial encounter: Secondary | ICD-10-CM | POA: Diagnosis not present

## 2017-01-09 DIAGNOSIS — M9901 Segmental and somatic dysfunction of cervical region: Secondary | ICD-10-CM | POA: Diagnosis not present

## 2017-01-09 DIAGNOSIS — M9903 Segmental and somatic dysfunction of lumbar region: Secondary | ICD-10-CM | POA: Diagnosis not present

## 2017-01-09 DIAGNOSIS — M542 Cervicalgia: Secondary | ICD-10-CM | POA: Diagnosis not present

## 2017-01-09 DIAGNOSIS — M9902 Segmental and somatic dysfunction of thoracic region: Secondary | ICD-10-CM | POA: Diagnosis not present

## 2017-01-09 DIAGNOSIS — S29019A Strain of muscle and tendon of unspecified wall of thorax, initial encounter: Secondary | ICD-10-CM | POA: Diagnosis not present

## 2017-01-09 DIAGNOSIS — M5032 Other cervical disc degeneration, mid-cervical region, unspecified level: Secondary | ICD-10-CM | POA: Diagnosis not present

## 2017-01-09 DIAGNOSIS — S39012A Strain of muscle, fascia and tendon of lower back, initial encounter: Secondary | ICD-10-CM | POA: Diagnosis not present

## 2017-01-10 ENCOUNTER — Other Ambulatory Visit: Payer: Self-pay | Admitting: Internal Medicine

## 2017-01-10 DIAGNOSIS — M542 Cervicalgia: Secondary | ICD-10-CM

## 2017-01-24 ENCOUNTER — Ambulatory Visit
Admission: RE | Admit: 2017-01-24 | Discharge: 2017-01-24 | Disposition: A | Discharge status: Home / Self Care | Payer: PPO | Source: Ambulatory Visit | Attending: Internal Medicine | Admitting: Internal Medicine

## 2017-01-24 DIAGNOSIS — M542 Cervicalgia: Secondary | ICD-10-CM

## 2017-01-24 DIAGNOSIS — M4802 Spinal stenosis, cervical region: Secondary | ICD-10-CM | POA: Diagnosis not present

## 2017-02-09 ENCOUNTER — Other Ambulatory Visit: Payer: Self-pay | Admitting: Neurosurgery

## 2017-02-09 DIAGNOSIS — M5412 Radiculopathy, cervical region: Secondary | ICD-10-CM | POA: Diagnosis not present

## 2017-02-20 ENCOUNTER — Encounter (HOSPITAL_COMMUNITY): Payer: Self-pay

## 2017-02-21 ENCOUNTER — Other Ambulatory Visit: Payer: Self-pay

## 2017-02-21 ENCOUNTER — Encounter (HOSPITAL_COMMUNITY)
Admission: RE | Admit: 2017-02-21 | Discharge: 2017-02-21 | Disposition: A | Discharge status: Home / Self Care | Payer: PPO | Source: Ambulatory Visit | Attending: Neurosurgery | Admitting: Neurosurgery

## 2017-02-21 ENCOUNTER — Encounter (HOSPITAL_COMMUNITY): Payer: Self-pay

## 2017-02-21 DIAGNOSIS — Z01812 Encounter for preprocedural laboratory examination: Secondary | ICD-10-CM | POA: Insufficient documentation

## 2017-02-21 DIAGNOSIS — R001 Bradycardia, unspecified: Secondary | ICD-10-CM | POA: Insufficient documentation

## 2017-02-21 DIAGNOSIS — M4802 Spinal stenosis, cervical region: Secondary | ICD-10-CM | POA: Insufficient documentation

## 2017-02-21 DIAGNOSIS — I4510 Unspecified right bundle-branch block: Secondary | ICD-10-CM | POA: Insufficient documentation

## 2017-02-21 DIAGNOSIS — Z01810 Encounter for preprocedural cardiovascular examination: Secondary | ICD-10-CM | POA: Insufficient documentation

## 2017-02-21 LAB — SURGICAL PCR SCREEN
MRSA, PCR: NEGATIVE
STAPHYLOCOCCUS AUREUS: NEGATIVE

## 2017-02-21 LAB — CBC WITH DIFFERENTIAL/PLATELET
BASOS ABS: 0 10*3/uL (ref 0.0–0.1)
BASOS PCT: 0 %
EOS ABS: 0 10*3/uL (ref 0.0–0.7)
Eosinophils Relative: 1 %
HCT: 36 % (ref 36.0–46.0)
Hemoglobin: 11.8 g/dL — ABNORMAL LOW (ref 12.0–15.0)
Lymphocytes Relative: 35 %
Lymphs Abs: 1.7 10*3/uL (ref 0.7–4.0)
MCH: 31.6 pg (ref 26.0–34.0)
MCHC: 32.8 g/dL (ref 30.0–36.0)
MCV: 96.3 fL (ref 78.0–100.0)
MONO ABS: 0.4 10*3/uL (ref 0.1–1.0)
Monocytes Relative: 7 %
NEUTROS ABS: 2.8 10*3/uL (ref 1.7–7.7)
NEUTROS PCT: 57 %
Platelets: 249 10*3/uL (ref 150–400)
RBC: 3.74 MIL/uL — ABNORMAL LOW (ref 3.87–5.11)
RDW: 13.6 % (ref 11.5–15.5)
WBC: 4.9 10*3/uL (ref 4.0–10.5)

## 2017-02-21 LAB — BASIC METABOLIC PANEL
ANION GAP: 7 (ref 5–15)
BUN: 20 mg/dL (ref 6–20)
CALCIUM: 9.6 mg/dL (ref 8.9–10.3)
CO2: 26 mmol/L (ref 22–32)
CREATININE: 1.01 mg/dL — AB (ref 0.44–1.00)
Chloride: 108 mmol/L (ref 101–111)
GFR, EST NON AFRICAN AMERICAN: 52 mL/min — AB (ref >60)
GLUCOSE: 98 mg/dL (ref 65–99)
Potassium: 4.2 mmol/L (ref 3.5–5.1)
Sodium: 141 mmol/L (ref 135–145)

## 2017-02-21 NOTE — Progress Notes (Signed)
Call to A. Zelenak,PA-C, reviewed that pt. Had last EKG- 2015 & the report that reflects it in the chart. Pt. Reports that she was in the ED at [facility] at that visit for reaction to first infusion of chemo/? Illness in addition to chemo reaction. Pt. Reports that she had a  fever of 102. F, also that she was admitted to the hosp. For 6 days. Today, pt. Denies any chest concerns, denies cardiac disease. Decision made for EKG to be done today for comparison. Will send chart to anesth. Review team after all results are available. In addition, Dr. Joylene Draft forsees pt. For primary care.

## 2017-02-21 NOTE — Pre-Procedure Instructions (Signed)
CASSADIE PANKONIN  02/21/2017      [place], [place] [address] Phone: [phone] Fax: [phone]    Your procedure is scheduled on Monday May 14.  Report to [facility] Admitting at 9:00 A.M.  Call this number if you have problems the morning of surgery:  [phone]   Remember:  Do not eat food or drink liquids after midnight.  Take these medicines the morning of surgery with A SIP OF WATER: pantoprazole (protonix), acetaminophen (tylenol) if needed, lyrica if needed, tramadol (ultram) if needed  7 days prior to surgery STOP taking any Aspirin, Aleve, Naproxen, Ibuprofen, Motrin, Advil, Goody's, BC's, all herbal medications, fish oil, and all vitamins]    Do not wear jewelry, make-up or nail polish.  Do not wear lotions, powders, or perfumes, or deoderant.  Do not shave 48 hours prior to surgery.  Men may shave face and neck.  Do not bring valuables to the hospital.  [facility] is not responsible for any belongings or valuables.  Contacts, dentures or bridgework may not be worn into surgery.  Leave your suitcase in the car.  After surgery it may be brought to your room.  For patients admitted to the hospital, discharge time will be determined by your treatment team.  Patients discharged the day of surgery will not be allowed to drive home.    Special instructions:    [place]- Preparing For Surgery  Before surgery, you can play an important role. Because skin is not sterile, your skin needs to be as free of germs as possible. You can reduce the number of germs on your skin by washing with CHG (chlorahexidine gluconate) Soap before surgery.  CHG is an antiseptic cleaner which kills germs and bonds with the skin to continue killing germs even after washing.  Please do not use if you have an allergy to CHG or antibacterial soaps. If your skin becomes reddened/irritated stop using the CHG.   Do not shave (including legs and underarms) for at least 48 hours prior to first CHG shower. It is OK to shave your face.  Please follow these instructions carefully.   1. Shower the NIGHT BEFORE SURGERY and the MORNING OF SURGERY with CHG.   2. If you chose to wash your hair, wash your hair first as usual with your normal shampoo.  3. After you shampoo, rinse your hair and body thoroughly to remove the shampoo.  4. Use CHG as you would any other liquid soap. You can apply CHG directly to the skin and wash gently with a scrungie or a clean washcloth.   5. Apply the CHG Soap to your body ONLY FROM THE NECK DOWN.  Do not use on open wounds or open sores. Avoid contact with your eyes, ears, mouth and genitals (private parts). Wash genitals (private parts) with your normal soap.  6. Wash thoroughly, paying special attention to the area where your surgery will be performed.  7. Thoroughly rinse your body with warm water from the neck down.  8. DO NOT shower/wash with your normal soap after using and rinsing off the CHG Soap.  9. Pat yourself dry with a CLEAN TOWEL.   10. Wear CLEAN PAJAMAS   11. Place CLEAN SHEETS on your bed the night of your first shower and DO NOT SLEEP WITH PETS.    Day of Surgery: Do not apply any deodorants/lotions. Please wear clean  clothes to the hospital/surgery center.      Please read over the following fact sheets that you were given. MRSA Information

## 2017-02-22 NOTE — Progress Notes (Signed)
Anesthesia chart review: Patient is a 76 year old female scheduled for  C5-6, C6-7 ACDF on 02/27/2017 by Dr. Annette Stable.  History includes never smoker, GERD, anxiety, depression, anemia, ocular albinism, Barrett's esophagus, fibromyalgia, dyslipidemia, right breast cancer s/p lumpectomy 01/13/15 and radiation (last 03/24/15), Port-a-cath 08/26/14 (s/p removal 09/2015), appendectomy, hysterectomy.   PCP is Dr. Crist Infante. HEM-ONC is Dr. Nicholas Lose.  Meds include  Lyrica, Protonix, Restoril, tramadol, vitamin D.  BP 133/68   Pulse 73   Temp 36.6 C   Resp 20   Ht 5\' 3"  (1.6 m)   Wt 166 lb 3.2 oz (75.4 kg)   SpO2 100%   BMI 29.44 kg/m   EKG 02/21/17 (ordered preoperative due to history of HLD and incomplete RBB/LAFB on 09/06/14 tracing): SB at 56 bpm, incomplete right BBB. No significant change since last tracing 09/06/14.  Echo 07/28/15 (for chemotherapy evaluation): Study Conclusions - Left ventricle: The cavity size was normal. Systolic function was   normal. The estimated ejection fraction was in the range of 55%   to 60%. Wall motion was normal; there were no regional wall   motion abnormalities.  Preoperative labs noted. Cr 1.01. H/H 11.8/36.0. PLT 249.   EKG appears stable. Unremarkable echo in 2016. No CV symptoms at PAT. If no acute changes then I anticipate that she can proceed as planned.  George [facility] Stay [facility]/Anesthesiology Phone [phone] 02/22/2017 4:26 PM

## 2017-02-27 ENCOUNTER — Inpatient Hospital Stay (HOSPITAL_COMMUNITY): Payer: PPO | Admitting: Anesthesiology

## 2017-02-27 ENCOUNTER — Inpatient Hospital Stay (HOSPITAL_COMMUNITY)
Admission: RE | Admit: 2017-02-27 | Discharge: 2017-02-28 | DRG: 473 | Disposition: A | Discharge status: Home / Self Care | Payer: PPO | Source: Ambulatory Visit | Attending: Neurosurgery | Admitting: Neurosurgery

## 2017-02-27 ENCOUNTER — Inpatient Hospital Stay (HOSPITAL_COMMUNITY): Payer: PPO | Admitting: Vascular Surgery

## 2017-02-27 ENCOUNTER — Inpatient Hospital Stay (HOSPITAL_COMMUNITY): Payer: PPO

## 2017-02-27 ENCOUNTER — Encounter (HOSPITAL_COMMUNITY)
Admission: RE | Disposition: A | Discharge status: Home / Self Care | Payer: Self-pay | Source: Ambulatory Visit | Attending: Neurosurgery

## 2017-02-27 ENCOUNTER — Encounter (HOSPITAL_COMMUNITY): Payer: Self-pay | Admitting: Urology

## 2017-02-27 DIAGNOSIS — M797 Fibromyalgia: Secondary | ICD-10-CM | POA: Diagnosis present

## 2017-02-27 DIAGNOSIS — Z800 Family history of malignant neoplasm of digestive organs: Secondary | ICD-10-CM | POA: Diagnosis not present

## 2017-02-27 DIAGNOSIS — M50122 Cervical disc disorder at C5-C6 level with radiculopathy: Secondary | ICD-10-CM | POA: Diagnosis present

## 2017-02-27 DIAGNOSIS — M4722 Other spondylosis with radiculopathy, cervical region: Secondary | ICD-10-CM | POA: Diagnosis present

## 2017-02-27 DIAGNOSIS — Z923 Personal history of irradiation: Secondary | ICD-10-CM

## 2017-02-27 DIAGNOSIS — M1990 Unspecified osteoarthritis, unspecified site: Secondary | ICD-10-CM | POA: Diagnosis not present

## 2017-02-27 DIAGNOSIS — M47812 Spondylosis without myelopathy or radiculopathy, cervical region: Secondary | ICD-10-CM | POA: Diagnosis not present

## 2017-02-27 DIAGNOSIS — Z419 Encounter for procedure for purposes other than remedying health state, unspecified: Secondary | ICD-10-CM

## 2017-02-27 DIAGNOSIS — D649 Anemia, unspecified: Secondary | ICD-10-CM | POA: Diagnosis not present

## 2017-02-27 DIAGNOSIS — Z853 Personal history of malignant neoplasm of breast: Secondary | ICD-10-CM | POA: Diagnosis not present

## 2017-02-27 DIAGNOSIS — M4802 Spinal stenosis, cervical region: Secondary | ICD-10-CM | POA: Diagnosis not present

## 2017-02-27 DIAGNOSIS — K219 Gastro-esophageal reflux disease without esophagitis: Secondary | ICD-10-CM | POA: Diagnosis present

## 2017-02-27 DIAGNOSIS — K2270 Barrett's esophagus without dysplasia: Secondary | ICD-10-CM | POA: Diagnosis present

## 2017-02-27 DIAGNOSIS — M4322 Fusion of spine, cervical region: Secondary | ICD-10-CM | POA: Diagnosis not present

## 2017-02-27 HISTORY — PX: ANTERIOR CERVICAL DECOMP/DISCECTOMY FUSION: SHX1161

## 2017-02-27 SURGERY — ANTERIOR CERVICAL DECOMPRESSION/DISCECTOMY FUSION 2 LEVELS
Anesthesia: General | Site: Spine Cervical

## 2017-02-27 MED ORDER — ONDANSETRON HCL 4 MG/2ML IJ SOLN
4.0000 mg | Freq: Four times a day (QID) | INTRAMUSCULAR | Status: DC | PRN
  Administered 2017-02-27: 4 mg via INTRAVENOUS
  Filled 2017-02-27: qty 2

## 2017-02-27 MED ORDER — THROMBIN 5000 UNITS EX SOLR
CUTANEOUS | Status: AC
  Filled 2017-02-27: qty 10000

## 2017-02-27 MED ORDER — PROPOFOL 10 MG/ML IV BOLUS
INTRAVENOUS | Status: DC | PRN
  Administered 2017-02-27: 120 mg via INTRAVENOUS

## 2017-02-27 MED ORDER — TRAMADOL HCL 50 MG PO TABS
50.0000 mg | ORAL_TABLET | Freq: Three times a day (TID) | ORAL | Status: DC | PRN
  Administered 2017-02-28: 50 mg via ORAL
  Filled 2017-02-27: qty 1

## 2017-02-27 MED ORDER — PROMETHAZINE HCL 25 MG/ML IJ SOLN: 6.2500 mg | INTRAMUSCULAR | Status: DC | PRN

## 2017-02-27 MED ORDER — MEPERIDINE HCL 25 MG/ML IJ SOLN: 6.2500 mg | INTRAMUSCULAR | Status: DC | PRN

## 2017-02-27 MED ORDER — DEXTROSE 5 % IV SOLN
INTRAVENOUS | Status: DC | PRN
  Administered 2017-02-27: 10:00:00 via INTRAVENOUS

## 2017-02-27 MED ORDER — HYDROCODONE-ACETAMINOPHEN 5-325 MG PO TABS
1.0000 | ORAL_TABLET | ORAL | Status: DC | PRN
  Administered 2017-02-27 – 2017-02-28 (×3): 2 via ORAL
  Filled 2017-02-27 (×3): qty 2

## 2017-02-27 MED ORDER — PHENYLEPHRINE HCL 10 MG/ML IJ SOLN
INTRAVENOUS | Status: DC | PRN
  Administered 2017-02-27: 45 ug/min via INTRAVENOUS

## 2017-02-27 MED ORDER — THROMBIN 5000 UNITS EX SOLR
CUTANEOUS | Status: DC | PRN
  Administered 2017-02-27 (×2): 5000 [IU] via TOPICAL

## 2017-02-27 MED ORDER — PREGABALIN 75 MG PO CAPS: 75.0000 mg | ORAL_CAPSULE | Freq: Three times a day (TID) | ORAL | Status: DC | PRN

## 2017-02-27 MED ORDER — CHLORHEXIDINE GLUCONATE CLOTH 2 % EX PADS: 6.0000 | MEDICATED_PAD | Freq: Once | CUTANEOUS | Status: DC

## 2017-02-27 MED ORDER — SUGAMMADEX SODIUM 200 MG/2ML IV SOLN
INTRAVENOUS | Status: AC
  Filled 2017-02-27: qty 2

## 2017-02-27 MED ORDER — ROCURONIUM BROMIDE 100 MG/10ML IV SOLN
INTRAVENOUS | Status: DC | PRN
  Administered 2017-02-27: 40 mg via INTRAVENOUS

## 2017-02-27 MED ORDER — SUGAMMADEX SODIUM 200 MG/2ML IV SOLN
INTRAVENOUS | Status: DC | PRN
  Administered 2017-02-27: 200 mg via INTRAVENOUS

## 2017-02-27 MED ORDER — PANTOPRAZOLE SODIUM 40 MG PO TBEC: 40.0000 mg | DELAYED_RELEASE_TABLET | Freq: Every day | ORAL | Status: DC | PRN

## 2017-02-27 MED ORDER — LIDOCAINE HCL 4 % MT SOLN
OROMUCOSAL | Status: DC | PRN
  Administered 2017-02-27: 4 mL via TOPICAL

## 2017-02-27 MED ORDER — HYDROMORPHONE HCL 1 MG/ML IJ SOLN
0.2500 mg | INTRAMUSCULAR | Status: DC | PRN
  Administered 2017-02-27 (×3): 0.5 mg via INTRAVENOUS

## 2017-02-27 MED ORDER — LIDOCAINE 2% (20 MG/ML) 5 ML SYRINGE
INTRAMUSCULAR | Status: AC
  Filled 2017-02-27: qty 10

## 2017-02-27 MED ORDER — SODIUM CHLORIDE 0.9 % IR SOLN
Status: DC | PRN
  Administered 2017-02-27: 500 mL

## 2017-02-27 MED ORDER — PHENOL 1.4 % MT LIQD: 1.0000 | OROMUCOSAL | Status: DC | PRN

## 2017-02-27 MED ORDER — CEFAZOLIN SODIUM-DEXTROSE 2-4 GM/100ML-% IV SOLN
2.0000 g | INTRAVENOUS | Status: AC
  Administered 2017-02-27: 2 g via INTRAVENOUS

## 2017-02-27 MED ORDER — CYCLOBENZAPRINE HCL 10 MG PO TABS
10.0000 mg | ORAL_TABLET | Freq: Three times a day (TID) | ORAL | Status: DC | PRN
  Administered 2017-02-28: 10 mg via ORAL
  Filled 2017-02-27: qty 1

## 2017-02-27 MED ORDER — CEFAZOLIN SODIUM-DEXTROSE 1-4 GM/50ML-% IV SOLN
1.0000 g | Freq: Three times a day (TID) | INTRAVENOUS | Status: AC
  Administered 2017-02-27 – 2017-02-28 (×2): 1 g via INTRAVENOUS
  Filled 2017-02-27 (×2): qty 50

## 2017-02-27 MED ORDER — SUCCINYLCHOLINE CHLORIDE 20 MG/ML IJ SOLN
INTRAMUSCULAR | Status: DC | PRN
  Administered 2017-02-27: 100 mg via INTRAVENOUS

## 2017-02-27 MED ORDER — VITAMIN D (ERGOCALCIFEROL) 1.25 MG (50000 UNIT) PO CAPS: 50000.0000 [IU] | ORAL_CAPSULE | ORAL | Status: DC

## 2017-02-27 MED ORDER — LACTATED RINGERS IV SOLN
INTRAVENOUS | Status: DC | PRN
  Administered 2017-02-27: 10:00:00 via INTRAVENOUS

## 2017-02-27 MED ORDER — MENTHOL 3 MG MT LOZG
1.0000 | LOZENGE | OROMUCOSAL | Status: DC | PRN
  Administered 2017-02-28: 3 mg via ORAL
  Filled 2017-02-27: qty 9

## 2017-02-27 MED ORDER — TEMAZEPAM 15 MG PO CAPS
30.0000 mg | ORAL_CAPSULE | Freq: Every day | ORAL | Status: DC
  Administered 2017-02-27: 30 mg via ORAL
  Filled 2017-02-27 (×2): qty 2

## 2017-02-27 MED ORDER — ROCURONIUM BROMIDE 10 MG/ML (PF) SYRINGE
PREFILLED_SYRINGE | INTRAVENOUS | Status: AC
  Filled 2017-02-27: qty 5

## 2017-02-27 MED ORDER — SODIUM CHLORIDE 0.9% FLUSH
3.0000 mL | Freq: Two times a day (BID) | INTRAVENOUS | Status: DC
  Administered 2017-02-27 (×2): 3 mL via INTRAVENOUS

## 2017-02-27 MED ORDER — ONDANSETRON HCL 4 MG/2ML IJ SOLN
INTRAMUSCULAR | Status: AC
  Filled 2017-02-27: qty 2

## 2017-02-27 MED ORDER — HYDROMORPHONE HCL 1 MG/ML IJ SOLN: 0.5000 mg | INTRAMUSCULAR | Status: DC | PRN

## 2017-02-27 MED ORDER — ARTIFICIAL TEARS OPHTHALMIC OINT
TOPICAL_OINTMENT | OPHTHALMIC | Status: DC | PRN
  Administered 2017-02-27: 1 via OPHTHALMIC

## 2017-02-27 MED ORDER — LIDOCAINE HCL (CARDIAC) 20 MG/ML IV SOLN
INTRAVENOUS | Status: DC | PRN
  Administered 2017-02-27: 80 mg via INTRAVENOUS

## 2017-02-27 MED ORDER — HYDROMORPHONE HCL 1 MG/ML IJ SOLN
INTRAMUSCULAR | Status: AC
  Administered 2017-02-27: 0.5 mg via INTRAVENOUS
  Filled 2017-02-27: qty 1

## 2017-02-27 MED ORDER — SODIUM CHLORIDE 0.9% FLUSH: 3.0000 mL | INTRAVENOUS | Status: DC | PRN

## 2017-02-27 MED ORDER — FENTANYL CITRATE (PF) 100 MCG/2ML IJ SOLN
INTRAMUSCULAR | Status: DC | PRN
  Administered 2017-02-27 (×2): 50 ug via INTRAVENOUS
  Administered 2017-02-27: 100 ug via INTRAVENOUS
  Administered 2017-02-27 (×3): 50 ug via INTRAVENOUS

## 2017-02-27 MED ORDER — MIDAZOLAM HCL 2 MG/2ML IJ SOLN
INTRAMUSCULAR | Status: AC
  Filled 2017-02-27: qty 2

## 2017-02-27 MED ORDER — DEXAMETHASONE SODIUM PHOSPHATE 10 MG/ML IJ SOLN
INTRAMUSCULAR | Status: AC
  Filled 2017-02-27: qty 1

## 2017-02-27 MED ORDER — CEFAZOLIN SODIUM-DEXTROSE 2-4 GM/100ML-% IV SOLN
INTRAVENOUS | Status: AC
  Filled 2017-02-27: qty 100

## 2017-02-27 MED ORDER — FENTANYL CITRATE (PF) 250 MCG/5ML IJ SOLN
INTRAMUSCULAR | Status: AC
  Filled 2017-02-27: qty 15

## 2017-02-27 MED ORDER — DEXAMETHASONE SODIUM PHOSPHATE 10 MG/ML IJ SOLN: 10.0000 mg | INTRAMUSCULAR | Status: DC

## 2017-02-27 MED ORDER — ONDANSETRON HCL 4 MG/2ML IJ SOLN
INTRAMUSCULAR | Status: DC | PRN
  Administered 2017-02-27: 4 mg via INTRAVENOUS

## 2017-02-27 MED ORDER — MIDAZOLAM HCL 5 MG/5ML IJ SOLN
INTRAMUSCULAR | Status: DC | PRN
  Administered 2017-02-27: 2 mg via INTRAVENOUS

## 2017-02-27 MED ORDER — SUCCINYLCHOLINE CHLORIDE 200 MG/10ML IV SOSY
PREFILLED_SYRINGE | INTRAVENOUS | Status: AC
  Filled 2017-02-27: qty 10

## 2017-02-27 MED ORDER — ARTIFICIAL TEARS OPHTHALMIC OINT
TOPICAL_OINTMENT | OPHTHALMIC | Status: AC
  Filled 2017-02-27: qty 3.5

## 2017-02-27 MED ORDER — DEXAMETHASONE SODIUM PHOSPHATE 10 MG/ML IJ SOLN
INTRAMUSCULAR | Status: DC | PRN
  Administered 2017-02-27: 10 mg via INTRAVENOUS

## 2017-02-27 MED ORDER — LACTATED RINGERS IV SOLN
INTRAVENOUS | Status: DC
Start: 2017-02-27 — End: 2017-02-27
  Administered 2017-02-27 (×2): via INTRAVENOUS

## 2017-02-27 MED ORDER — STERILE WATER FOR IRRIGATION IR SOLN
Status: DC | PRN
  Administered 2017-02-27: 1000 mL

## 2017-02-27 MED ORDER — HYDROMORPHONE HCL 1 MG/ML IJ SOLN
INTRAMUSCULAR | Status: AC
  Filled 2017-02-27: qty 0.5

## 2017-02-27 MED ORDER — ONDANSETRON HCL 4 MG PO TABS: 4.0000 mg | ORAL_TABLET | Freq: Four times a day (QID) | ORAL | Status: DC | PRN

## 2017-02-27 MED ORDER — 0.9 % SODIUM CHLORIDE (POUR BTL) OPTIME
TOPICAL | Status: DC | PRN
  Administered 2017-02-27 (×2): 1000 mL

## 2017-02-27 SURGICAL SUPPLY — 67 items
ADH SKN CLS APL DERMABOND .7 (GAUZE/BANDAGES/DRESSINGS) ×1
APL SKNCLS STERI-STRIP NONHPOA (GAUZE/BANDAGES/DRESSINGS) ×1
BAG DECANTER FOR FLEXI CONT (MISCELLANEOUS) ×3 IMPLANT
BENZOIN TINCTURE PRP APPL 2/3 (GAUZE/BANDAGES/DRESSINGS) ×3 IMPLANT
BIT DRILL 13 (BIT) ×1 IMPLANT
BIT DRILL 13MM (BIT) ×1
BUR MATCHSTICK NEURO 3.0 LAGG (BURR) ×3 IMPLANT
CAGE PEEK 6X14X11 (Cage) ×6 IMPLANT
CAGE SPNL 11X14X6XRADOPQ (Cage) IMPLANT
CANISTER SUCT 3000ML PPV (MISCELLANEOUS) ×3 IMPLANT
CARTRIDGE OIL MAESTRO DRILL (MISCELLANEOUS) ×1 IMPLANT
CLOSURE WOUND 1/2 X4 (GAUZE/BANDAGES/DRESSINGS) ×1
DERMABOND ADVANCED (GAUZE/BANDAGES/DRESSINGS) ×2
DERMABOND ADVANCED .7 DNX12 (GAUZE/BANDAGES/DRESSINGS) IMPLANT
DIFFUSER DRILL AIR PNEUMATIC (MISCELLANEOUS) ×3 IMPLANT
DRAPE C-ARM 42X72 X-RAY (DRAPES) ×6 IMPLANT
DRAPE LAPAROTOMY 100X72 PEDS (DRAPES) ×3 IMPLANT
DRAPE MICROSCOPE LEICA (MISCELLANEOUS) ×3 IMPLANT
DRAPE POUCH INSTRU U-SHP 10X18 (DRAPES) ×3 IMPLANT
DRSG OPSITE POSTOP 3X4 (GAUZE/BANDAGES/DRESSINGS) ×2 IMPLANT
DURAPREP 6ML APPLICATOR 50/CS (WOUND CARE) ×3 IMPLANT
ELECT COATED BLADE 2.86 ST (ELECTRODE) ×3 IMPLANT
ELECT REM PT RETURN 9FT ADLT (ELECTROSURGICAL) ×3
ELECTRODE REM PT RTRN 9FT ADLT (ELECTROSURGICAL) ×1 IMPLANT
GAUZE SPONGE 4X4 12PLY STRL (GAUZE/BANDAGES/DRESSINGS) ×3 IMPLANT
GAUZE SPONGE 4X4 12PLY STRL LF (GAUZE/BANDAGES/DRESSINGS) ×2 IMPLANT
GAUZE SPONGE 4X4 16PLY XRAY LF (GAUZE/BANDAGES/DRESSINGS) IMPLANT
GLOVE BIO SURGEON STRL SZ7 (GLOVE) ×2 IMPLANT
GLOVE BIO SURGEON STRL SZ8 (GLOVE) ×2 IMPLANT
GLOVE BIO SURGEON STRL SZ8.5 (GLOVE) ×2 IMPLANT
GLOVE BIOGEL PI IND STRL 6.5 (GLOVE) IMPLANT
GLOVE BIOGEL PI INDICATOR 6.5 (GLOVE) ×2
GLOVE ECLIPSE 9.0 STRL (GLOVE) ×3 IMPLANT
GLOVE EXAM NITRILE LRG STRL (GLOVE) IMPLANT
GLOVE EXAM NITRILE XL STR (GLOVE) IMPLANT
GLOVE EXAM NITRILE XS STR PU (GLOVE) ×2 IMPLANT
GLOVE SURG SS PI 6.5 STRL IVOR (GLOVE) ×4 IMPLANT
GOWN STRL REUS W/ TWL LRG LVL3 (GOWN DISPOSABLE) IMPLANT
GOWN STRL REUS W/ TWL XL LVL3 (GOWN DISPOSABLE) ×1 IMPLANT
GOWN STRL REUS W/TWL 2XL LVL3 (GOWN DISPOSABLE) IMPLANT
GOWN STRL REUS W/TWL LRG LVL3 (GOWN DISPOSABLE)
GOWN STRL REUS W/TWL XL LVL3 (GOWN DISPOSABLE) ×3
HALTER HD/CHIN CERV TRACTION D (MISCELLANEOUS) ×3 IMPLANT
HEMOSTAT SURGICEL 2X14 (HEMOSTASIS) IMPLANT
KIT BASIN OR (CUSTOM PROCEDURE TRAY) ×3 IMPLANT
KIT ROOM TURNOVER OR (KITS) ×3 IMPLANT
NDL SPNL 20GX3.5 QUINCKE YW (NEEDLE) ×1 IMPLANT
NEEDLE SPNL 20GX3.5 QUINCKE YW (NEEDLE) ×3 IMPLANT
NS IRRIG 1000ML POUR BTL (IV SOLUTION) ×3 IMPLANT
OIL CARTRIDGE MAESTRO DRILL (MISCELLANEOUS) ×3
PACK LAMINECTOMY NEURO (CUSTOM PROCEDURE TRAY) ×3 IMPLANT
PAD ARMBOARD 7.5X6 YLW CONV (MISCELLANEOUS) ×9 IMPLANT
PLATE VISION ELITE 40MM (Plate) ×2 IMPLANT
RUBBERBAND STERILE (MISCELLANEOUS) ×6 IMPLANT
SCREW ST 13X4XST VA NS SPNE (Screw) IMPLANT
SCREW ST VAR 4 ATL (Screw) ×18 IMPLANT
SPONGE INTESTINAL PEANUT (DISPOSABLE) ×3 IMPLANT
SPONGE SURGIFOAM ABS GEL SZ50 (HEMOSTASIS) ×3 IMPLANT
STRIP CLOSURE SKIN 1/2X4 (GAUZE/BANDAGES/DRESSINGS) ×2 IMPLANT
SUT VIC AB 3-0 SH 8-18 (SUTURE) ×3 IMPLANT
SUT VIC AB 4-0 RB1 18 (SUTURE) ×3 IMPLANT
TAPE CLOTH 4X10 WHT NS (GAUZE/BANDAGES/DRESSINGS) ×3 IMPLANT
TAPE CLOTH SURG 4X10 WHT LF (GAUZE/BANDAGES/DRESSINGS) ×2 IMPLANT
TOWEL GREEN STERILE (TOWEL DISPOSABLE) ×3 IMPLANT
TOWEL GREEN STERILE FF (TOWEL DISPOSABLE) ×2 IMPLANT
TRAP SPECIMEN MUCOUS 40CC (MISCELLANEOUS) ×3 IMPLANT
WATER STERILE IRR 1000ML POUR (IV SOLUTION) ×3 IMPLANT

## 2017-02-27 NOTE — Anesthesia Preprocedure Evaluation (Signed)
Anesthesia Evaluation  Patient identified by MRN, date of birth, ID band Patient awake    Reviewed: Allergy & Precautions, NPO status , Patient's Chart, lab work & pertinent test results  Airway Mallampati: II  TM Distance: >3 FB Neck ROM: Full    Dental no notable dental hx. (+) Teeth Intact, Caps   Pulmonary pneumonia, resolved,    Pulmonary exam normal breath sounds clear to auscultation       Cardiovascular negative cardio ROS Normal cardiovascular exam Rhythm:Regular Rate:Normal     Neuro/Psych PSYCHIATRIC DISORDERS Anxiety Depression negative neurological ROS     GI/Hepatic Neg liver ROS, GERD  Medicated and Controlled,Hx/o Barrett's esophagus   Endo/Other  negative endocrine ROS  Renal/GU negative Renal ROS  negative genitourinary   Musculoskeletal  (+) Arthritis , Fibromyalgia -  Abdominal Normal abdominal exam  (+)   Peds  Hematology  (+) anemia ,   Anesthesia Other Findings   Reproductive/Obstetrics                             Anesthesia Physical Anesthesia Plan  ASA: II  Anesthesia Plan: General   Post-op Pain Management:    Induction: Intravenous  Airway Management Planned: Oral ETT  Additional Equipment:   Intra-op Plan:   Post-operative Plan: Extubation in OR  Informed Consent: I have reviewed the patients History and Physical, chart, labs and discussed the procedure including the risks, benefits and alternatives for the proposed anesthesia with the patient or authorized representative who has indicated his/her understanding and acceptance.   Dental advisory given  Plan Discussed with: Anesthesiologist, CRNA and Surgeon  Anesthesia Plan Comments:         Anesthesia Quick Evaluation

## 2017-02-27 NOTE — Anesthesia Postprocedure Evaluation (Signed)
Anesthesia Post Note  Patient: Kelli Owens  Procedure(s) Performed: Procedure(s) (LRB): Anterior Cervical Five-Six/Six-Seven Decompression and Fusion (N/A)  Patient location during evaluation: PACU Anesthesia Type: General Level of consciousness: awake and alert and oriented Pain management: pain level controlled Vital Signs Assessment: post-procedure vital signs reviewed and stable Respiratory status: spontaneous breathing, nonlabored ventilation and respiratory function stable Cardiovascular status: blood pressure returned to baseline and stable Postop Assessment: no signs of nausea or vomiting Anesthetic complications: no       Last Vitals:  Vitals:   02/27/17 1240 02/27/17 1255  BP: (!) 157/68 (!) 157/63  Pulse: 66 64  Resp: 12 (!) 9  Temp:      Last Pain:  Vitals:   02/27/17 0911  TempSrc:   PainSc: 7                  Mario Voong A.

## 2017-02-27 NOTE — Anesthesia Procedure Notes (Signed)
Procedure Name: Intubation Date/Time: 02/27/2017 10:28 AM Performed by: Jacquiline Doe A Pre-anesthesia Checklist: Patient identified, Emergency Drugs available, Suction available and Patient being monitored Patient Re-evaluated:Patient Re-evaluated prior to inductionOxygen Delivery Method: Circle System Utilized and Circle system utilized Preoxygenation: Pre-oxygenation with 100% oxygen Intubation Type: IV induction Ventilation: Mask ventilation without difficulty Laryngoscope Size: Mac and 3 Grade View: Grade I Tube type: Oral Tube size: 7.5 mm Number of attempts: 1 Airway Equipment and Method: Stylet,  Oral airway and LTA kit utilized Placement Confirmation: ETT inserted through vocal cords under direct vision,  positive ETCO2 and breath sounds checked- equal and bilateral Secured at: 21 cm Tube secured with: Tape Dental Injury: Teeth and Oropharynx as per pre-operative assessment

## 2017-02-27 NOTE — Transfer of Care (Signed)
Immediate Anesthesia Transfer of Care Note  Patient: Kelli Owens  Procedure(s) Performed: Procedure(s): Anterior Cervical Five-Six/Six-Seven Decompression and Fusion (N/A)  Patient Location: PACU  Anesthesia Type:General  Level of Consciousness: awake, oriented, sedated, patient cooperative and responds to stimulation  Airway & Oxygen Therapy: Patient Spontanous Breathing and Patient connected to nasal cannula oxygen  Post-op Assessment: Report given to RN, Post -op Vital signs reviewed and stable, Patient moving all extremities and Patient moving all extremities X 4  Post vital signs: Reviewed and stable  Last Vitals:  Vitals:   02/27/17 0910  BP: (!) 147/55  Pulse: 60  Resp: 20  Temp: 36.4 C    Last Pain:  Vitals:   02/27/17 0911  TempSrc:   PainSc: 7       Patients Stated Pain Goal: 5 (79/72/82 0601)  Complications: No apparent anesthesia complications

## 2017-02-27 NOTE — Op Note (Signed)
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  Asst. Surgeon: Arnoldo Morale  Anesthesia: General  Indication: 76 year old female with neck and bilateral upper extremity symptoms right greater than left. Patient's failed conservative management her workup demonstrates evidence of marked spondylosis with stenosis at C5-6 and C6-7. Patient presents now for two-level anterior cervical decompression and fusion in hopes of improving her symptoms  Operative note: After induction of anesthesia, patient position supine with Extended and held in place of halter traction. Patient's anterior cervical region prepped and draped sterilely. Incision made overlying C6. Dissection performed on the right. Retractor placed. Fluoroscopy used. Levels confirmed. Disc spaces incised at both levels. Discectomies performed using various instruments down to level of the posterior annulus. Microscope brought into field used throughout the remainder of the discectomy. His remaining aspects of annulus and osteophytes removed using high-speed drill down to level posterior longitudinal limb. Posterior longitudinal was elevated and resected piecemeal fashion. Underlying thecal sac was identified. Wide central decompression and perform undercutting the bodies of C5 and C6. Decompression then proceeded into each neural foramen. Wide anterior foraminotomies performed on course exiting C6 nerve roots bilaterally. At this point a very thorough decompression had been achieved. There was no evidence of injury to the thecal sac or nerve roots. Procedure then repeated at C6-7 again without complication. Wound is then  irrigated with and like solution. 6 mm Metronic anatomic peek cages were packed with locally harvested autograft. Each cage was then impacted into place and recessed slightly from the anterior cortical margin. 40 mm Atlantis anterior cervical plate was then placed over the C5-6 and 7 levels. This an attachment or fluoroscopic guidance using 13 mm variable screws 2 each at all levels. Locking screws were engaged L3 levels. Final images revealed good position the bone graft hardware at proper upper level with normal alignment is spine. Wounds and irrigated one final time. Hemostasis was assured with bipolar chart was and close in layers with Vicryl sutures. Steri-Strips and sterile dressing were applied. No apparent complications. Patient tolerated the procedure well and she returns to the recovery room postop.

## 2017-02-27 NOTE — Brief Op Note (Signed)
02/27/2017  11:58 AM  PATIENT:  Cecilie Lowers  76 y.o. female  PRE-OPERATIVE DIAGNOSIS:  Stenosis  POST-OPERATIVE DIAGNOSIS:  Stenosis  PROCEDURE:  Procedure(s): Anterior Cervical Five-Six/Six-Seven Decompression and Fusion (N/A)  SURGEON:  Surgeon(s) and Role:    * Earnie Larsson, MD - Primary    * Newman Pies, MD - Assisting  PHYSICIAN ASSISTANT:   ASSISTANTS:    ANESTHESIA:   general  EBL:  Total I/O In: 600 [I.V.:600] Out: 50 [Blood:50]  BLOOD ADMINISTERED:none  DRAINS: none   LOCAL MEDICATIONS USED:  NONE  SPECIMEN:  No Specimen  DISPOSITION OF SPECIMEN:  N/A  COUNTS:  YES  TOURNIQUET:  * No tourniquets in log *  DICTATION: .Dragon Dictation  PLAN OF CARE: Admit to inpatient   PATIENT DISPOSITION:  PACU - hemodynamically stable.   Delay start of Pharmacological VTE agent (>24hrs) due to surgical blood loss or risk of bleeding: yes

## 2017-02-27 NOTE — H&P (Signed)
Kelli Owens is an 76 y.o. female.   Chief Complaint: Neck pain HPI: 76 year old female with neck and bilateral upper extremity symptoms are right greater than left. Symptoms have failed conservative management. Workup demonstrates evidence of marked disc degeneration with associated spondylosis and rightward C5-6 spinal cord and exiting C6 nerve root compression and leftward C7 nerve root compression. Patient has failed conservative management and presents now for 2 level anterior cervical decompression and fusion.  Past Medical History:  Diagnosis Date  . Anemia   . Anxiety   . Barrett's esophagus   . Breast cancer of upper-inner quadrant of right female breast ([place]) 08/08/2014  . DDD (degenerative disc disease), lumbar    cervical spine degenerative   . Depression   . Dyslipidemia   . Fibromyalgia    takes Lyrica as needed   . GERD (gastroesophageal reflux disease)   . HSV infection   . OA (ocular albinism) ([place])   . Osteopenia   . Radiation 02/23/15-03/24/15   right breast  . Vitamin D deficiency   . Whooping cough     Past Surgical History:  Procedure Laterality Date  . APPENDECTOMY    . BILATERAL SALPINGOOPHORECTOMY    . BREAST LUMPECTOMY WITH NEEDLE LOCALIZATION AND AXILLARY SENTINEL LYMPH NODE BX Right 01/13/15   RIGHT BREAST LUMPECTOMY WITH RADIOACTIVE SEED AND RIGHT AXILLARY SENTINEL LYMPH NODE BIOPSY  . COLONOSCOPY    . EYE SURGERY     cataracts removed- /w IOL  . PORTA CATH REMOVAL  09/2015  . PORTACATH PLACEMENT Left 08/26/2014   Procedure: INSERTION PORT-A-CATH;  Surgeon: Alphonsa Overall, MD;  Location: [facility] OR[facility];  Service: General;  Laterality: Left;  . TOTAL ABDOMINAL HYSTERECTOMY      Family History  Problem Relation Age of Onset  . Diabetes Father   . Stroke Mother   . Kidney disease Mother        lost rt. kidney due to stone  . Gastric cancer Brother   . Dementia Brother   . Cancer Unknown        groin area  . Colon cancer Sister 69   Social  History:  reports that she has never smoked. She has never used smokeless tobacco. She reports that she drinks about 1.2 oz of alcohol per week . She reports that she does not use drugs.  Allergies:  Allergies  Allergen Reactions  . No Known Allergies     Medications Prior to Admission  Medication Sig Dispense Refill  . acetaminophen (TYLENOL) 325 MG tablet Take 650 mg by mouth every 6 (six) hours as needed (for pain.).     Marland Kitchen LYRICA 75 MG capsule Take 75 mg by mouth 3 (three) times daily as needed (for pain).   0  . pantoprazole (PROTONIX) 40 MG tablet Take 40 mg by mouth daily as needed (for acid reflux/indigestion).     . temazepam (RESTORIL) 30 MG capsule Take 30 mg by mouth at bedtime.     . traMADol (ULTRAM) 50 MG tablet Take 50 mg by mouth every 8 (eight) hours as needed (for neck/shoulders pain.).     Marland Kitchen Vitamin D, Ergocalciferol, (DRISDOL) 50000 units CAPS capsule Take 50,000 Units by mouth every 7 (seven) days. Thursday      No results found for this or any previous visit (from the past 48 hour(s)). No results found.  Pertinent items noted in HPI and remainder of comprehensive ROS otherwise negative.  Blood pressure (!) 147/55, pulse 60, temperature 97.6 F (36.4 C),  temperature source Oral, resp. rate 20, SpO2 100 %.  Patient is awake and alert. She is oriented and appropriate. Speech is fluent. Judgment and insight are intact. Cranial nerve function normal bilaterally. Motor examination reveals mild motor weakness involving her right wrist extensors otherwise motor strength intact. Sensory examination with right C6 hypesthesia to pinprick and light touch. Otherwise normal. Deep tendon reflex is normal active. No evidence of long tract signs. Gait and posture normal. Spurling's maneuver positive on the right and equivocal on the left. Examination head ears eyes insert mark. Chest and abdomen are benign. Extremities are free from injury deformity. Assessment/Plan C5-6, C6-7  spondylosis with stenosis and radiculopathy. Plan C5-6 and C6-7 anterior cervical decompression and fusion utilizing interbody peek cage, local harvested autograft, and anterior plate instrumentation. Risks and benefits were explained. Patient wishes to proceed.  Kelli Owens A 02/27/2017, 10:09 AM

## 2017-02-27 NOTE — Progress Notes (Signed)
Orthopedic Tech Progress Note Patient Details:  Kelli Owens November 19, 1940 [id number]  Ortho Devices Type of Ortho Device: Soft collar Ortho Device/Splint Interventions: Application   [place] Pink 02/27/2017, 12:52 PM

## 2017-02-28 ENCOUNTER — Encounter (HOSPITAL_COMMUNITY): Payer: Self-pay | Admitting: Neurosurgery

## 2017-02-28 MED ORDER — HYDROCODONE-ACETAMINOPHEN 5-325 MG PO TABS: 1.0000 | ORAL_TABLET | ORAL | 0 refills | Status: AC | PRN

## 2017-02-28 MED ORDER — CYCLOBENZAPRINE HCL 10 MG PO TABS: 10.0000 mg | ORAL_TABLET | Freq: Three times a day (TID) | ORAL | 0 refills | Status: AC | PRN

## 2017-02-28 NOTE — Discharge Instructions (Signed)

## 2017-02-28 NOTE — Progress Notes (Signed)
Patient is discharged from room 3C04 at this time. Alert and in stable condition. IV site d/c'd and instructions read to patient and family with understanding verbalized. Left unit via wheelchair with spouse and all belongings at side.

## 2017-02-28 NOTE — Discharge Summary (Signed)
Physician Discharge Summary  Patient ID: Kelli Owens MRN: [id number] DOB/AGE: 78-Feb-1942 78 y.o.  Admit date: 02/27/2017 Discharge date: 02/28/2017  Admission Diagnoses:  Discharge Diagnoses:  Active Problems:   Cervical stenosis of spinal canal   Discharged Condition: good  Hospital Course: Patient admitted to the hospital where she underwent an uncomplicated two-level anterior cervical decompression infusion. Postoperative she is doing very well. Preoperative neck and upper extremity pain much improved. Swallowing well. Ambulating without difficulty. For discharge home.  Consults:   Significant Diagnostic Studies:   Treatments:   Discharge Exam: Blood pressure (!) 113/49, pulse 71, temperature 98.4 F (36.9 C), temperature source Oral, resp. rate 18, SpO2 100 %. Awake and alert. Oriented and appropriate. Cranial nerve function intact. Motor and sensory function extremities normal. Wound clean and dry. Chest and abdomen benign.  Disposition: 01-Home or Self Care   Allergies as of 02/28/2017      Reactions   No Known Allergies       Medication List    TAKE these medications   acetaminophen 325 MG tablet Commonly known as:  TYLENOL Take 650 mg by mouth every 6 (six) hours as needed (for pain.).   cyclobenzaprine 10 MG tablet Commonly known as:  FLEXERIL Take 1 tablet (10 mg total) by mouth 3 (three) times daily as needed for muscle spasms.   HYDROcodone-acetaminophen 5-325 MG tablet Commonly known as:  NORCO/VICODIN Take 1-2 tablets by mouth every 4 (four) hours as needed (breakthrough pain).   LYRICA 75 MG capsule Generic drug:  pregabalin Take 75 mg by mouth 3 (three) times daily as needed (for pain).   pantoprazole 40 MG tablet Commonly known as:  PROTONIX Take 40 mg by mouth daily as needed (for acid reflux/indigestion).   temazepam 30 MG capsule Commonly known as:  RESTORIL Take 30 mg by mouth at bedtime.   traMADol 50 MG tablet Commonly known  as:  ULTRAM Take 50 mg by mouth every 8 (eight) hours as needed (for neck/shoulders pain.).   Vitamin D (Ergocalciferol) 50000 units Caps capsule Commonly known as:  DRISDOL Take 50,000 Units by mouth every 7 (seven) days. Thursday        Signed: Reghan Thul A 02/28/2017, 10:32 AM

## 2017-03-21 NOTE — Addendum Note (Signed)
Encounter addended by: Merilyn Pagan A, RN on: 03/21/2017  3:34 PM<BR>    Actions taken: Delete clinical note

## 2017-08-24 DIAGNOSIS — E7849 Other hyperlipidemia: Secondary | ICD-10-CM | POA: Diagnosis not present

## 2017-08-24 DIAGNOSIS — E538 Deficiency of other specified B group vitamins: Secondary | ICD-10-CM | POA: Diagnosis not present

## 2017-08-24 DIAGNOSIS — R82998 Other abnormal findings in urine: Secondary | ICD-10-CM | POA: Diagnosis not present

## 2017-08-24 DIAGNOSIS — M810 Age-related osteoporosis without current pathological fracture: Secondary | ICD-10-CM | POA: Diagnosis not present

## 2017-08-31 DIAGNOSIS — M79601 Pain in right arm: Secondary | ICD-10-CM | POA: Diagnosis not present

## 2017-08-31 DIAGNOSIS — Z6830 Body mass index (BMI) 30.0-30.9, adult: Secondary | ICD-10-CM | POA: Diagnosis not present

## 2017-08-31 DIAGNOSIS — C50211 Malignant neoplasm of upper-inner quadrant of right female breast: Secondary | ICD-10-CM | POA: Diagnosis not present

## 2017-08-31 DIAGNOSIS — M810 Age-related osteoporosis without current pathological fracture: Secondary | ICD-10-CM | POA: Diagnosis not present

## 2017-08-31 DIAGNOSIS — Z23 Encounter for immunization: Secondary | ICD-10-CM | POA: Diagnosis not present

## 2017-08-31 DIAGNOSIS — E538 Deficiency of other specified B group vitamins: Secondary | ICD-10-CM | POA: Diagnosis not present

## 2017-08-31 DIAGNOSIS — E7849 Other hyperlipidemia: Secondary | ICD-10-CM | POA: Diagnosis not present

## 2017-08-31 DIAGNOSIS — Z1389 Encounter for screening for other disorder: Secondary | ICD-10-CM | POA: Diagnosis not present

## 2017-08-31 DIAGNOSIS — M542 Cervicalgia: Secondary | ICD-10-CM | POA: Diagnosis not present

## 2017-08-31 DIAGNOSIS — Z0000 Encounter for general adult medical examination without abnormal findings: Secondary | ICD-10-CM | POA: Diagnosis not present

## 2017-08-31 DIAGNOSIS — G4709 Other insomnia: Secondary | ICD-10-CM | POA: Diagnosis not present

## 2017-08-31 DIAGNOSIS — Z1231 Encounter for screening mammogram for malignant neoplasm of breast: Secondary | ICD-10-CM | POA: Diagnosis not present

## 2017-08-31 DIAGNOSIS — M5030 Other cervical disc degeneration, unspecified cervical region: Secondary | ICD-10-CM | POA: Diagnosis not present

## 2017-09-22 ENCOUNTER — Other Ambulatory Visit: Payer: Self-pay | Admitting: Internal Medicine

## 2017-09-22 DIAGNOSIS — Z9889 Other specified postprocedural states: Secondary | ICD-10-CM

## 2017-09-28 ENCOUNTER — Ambulatory Visit
Admission: RE | Admit: 2017-09-28 | Discharge: 2017-09-28 | Disposition: A | Discharge status: Home / Self Care | Payer: PPO | Source: Ambulatory Visit | Attending: Internal Medicine | Admitting: Internal Medicine

## 2017-09-28 DIAGNOSIS — Z9889 Other specified postprocedural states: Secondary | ICD-10-CM

## 2017-09-28 DIAGNOSIS — R922 Inconclusive mammogram: Secondary | ICD-10-CM | POA: Diagnosis not present

## 2018-10-23 ENCOUNTER — Other Ambulatory Visit: Payer: Self-pay | Admitting: Internal Medicine

## 2018-10-23 DIAGNOSIS — Z9889 Other specified postprocedural states: Secondary | ICD-10-CM

## 2018-11-01 ENCOUNTER — Ambulatory Visit
Admission: RE | Admit: 2018-11-01 | Discharge: 2018-11-01 | Disposition: A | Discharge status: Home / Self Care | Payer: Medicare Other | Source: Ambulatory Visit | Attending: Internal Medicine | Admitting: Internal Medicine

## 2018-11-01 DIAGNOSIS — Z9889 Other specified postprocedural states: Secondary | ICD-10-CM

## 2019-09-30 ENCOUNTER — Other Ambulatory Visit: Payer: Self-pay | Admitting: Internal Medicine

## 2019-09-30 DIAGNOSIS — Z1231 Encounter for screening mammogram for malignant neoplasm of breast: Secondary | ICD-10-CM
